# Patient Record
Sex: Female | Born: 1960 | Race: White | Hispanic: Yes | Smoking: Never smoker
Health system: Southern US, Community
[De-identification: ages and names within clinical notes are randomized; demographics above are authoritative.]

## PROBLEM LIST (undated history)

## (undated) DIAGNOSIS — D649 Anemia, unspecified: Secondary | ICD-10-CM

## (undated) DIAGNOSIS — I10 Essential (primary) hypertension: Secondary | ICD-10-CM

## (undated) DIAGNOSIS — G43909 Migraine, unspecified, not intractable, without status migrainosus: Secondary | ICD-10-CM

## (undated) DIAGNOSIS — K219 Gastro-esophageal reflux disease without esophagitis: Secondary | ICD-10-CM

## (undated) DIAGNOSIS — G4700 Insomnia, unspecified: Secondary | ICD-10-CM

## (undated) DIAGNOSIS — F329 Major depressive disorder, single episode, unspecified: Secondary | ICD-10-CM

## (undated) DIAGNOSIS — J45909 Unspecified asthma, uncomplicated: Secondary | ICD-10-CM

## (undated) DIAGNOSIS — F419 Anxiety disorder, unspecified: Secondary | ICD-10-CM

## (undated) DIAGNOSIS — T8859XA Other complications of anesthesia, initial encounter: Secondary | ICD-10-CM

## (undated) DIAGNOSIS — F32A Depression, unspecified: Secondary | ICD-10-CM

## (undated) HISTORY — PX: CHOLECYSTECTOMY: SHX55

## (undated) HISTORY — PX: KNEE ARTHROSCOPY: SUR90

## (undated) HISTORY — DX: Other complications of anesthesia, initial encounter: T88.59XA

---

## 1997-09-29 ENCOUNTER — Other Ambulatory Visit: Admission: RE | Admit: 1997-09-29 | Discharge: 1997-09-29 | Payer: Self-pay | Admitting: Obstetrics and Gynecology

## 1998-07-11 ENCOUNTER — Other Ambulatory Visit: Admission: RE | Admit: 1998-07-11 | Discharge: 1998-07-11 | Payer: Self-pay | Admitting: *Deleted

## 1998-12-27 ENCOUNTER — Inpatient Hospital Stay: Admission: EM | Admit: 1998-12-27 | Discharge: 1998-12-29 | Payer: Self-pay | Admitting: Emergency Medicine

## 1999-03-22 ENCOUNTER — Inpatient Hospital Stay (HOSPITAL_COMMUNITY): Admission: AD | Admit: 1999-03-22 | Discharge: 1999-03-22 | Payer: Self-pay | Admitting: *Deleted

## 1999-03-29 ENCOUNTER — Inpatient Hospital Stay (HOSPITAL_COMMUNITY): Admission: AD | Admit: 1999-03-29 | Discharge: 1999-04-01 | Payer: Self-pay | Admitting: *Deleted

## 1999-04-02 ENCOUNTER — Encounter: Admission: RE | Admit: 1999-04-02 | Discharge: 1999-06-12 | Payer: Self-pay | Admitting: *Deleted

## 1999-05-10 ENCOUNTER — Other Ambulatory Visit: Admission: RE | Admit: 1999-05-10 | Discharge: 1999-05-10 | Payer: Self-pay | Admitting: *Deleted

## 2000-06-10 ENCOUNTER — Other Ambulatory Visit: Admission: RE | Admit: 2000-06-10 | Discharge: 2000-06-10 | Payer: Self-pay | Admitting: *Deleted

## 2001-02-25 ENCOUNTER — Encounter: Payer: Self-pay | Admitting: *Deleted

## 2001-02-25 ENCOUNTER — Encounter: Admission: RE | Admit: 2001-02-25 | Discharge: 2001-02-25 | Payer: Self-pay | Admitting: *Deleted

## 2001-12-10 ENCOUNTER — Other Ambulatory Visit: Admission: RE | Admit: 2001-12-10 | Discharge: 2001-12-10 | Payer: Self-pay | Admitting: *Deleted

## 2002-05-04 ENCOUNTER — Encounter: Payer: Self-pay | Admitting: Orthopedic Surgery

## 2002-05-04 ENCOUNTER — Encounter: Admission: RE | Admit: 2002-05-04 | Discharge: 2002-05-04 | Payer: Self-pay | Admitting: Orthopedic Surgery

## 2002-10-05 ENCOUNTER — Encounter: Payer: Self-pay | Admitting: *Deleted

## 2002-10-05 ENCOUNTER — Encounter: Admission: RE | Admit: 2002-10-05 | Discharge: 2002-10-05 | Payer: Self-pay | Admitting: *Deleted

## 2003-05-26 ENCOUNTER — Other Ambulatory Visit: Admission: RE | Admit: 2003-05-26 | Discharge: 2003-05-26 | Payer: Self-pay | Admitting: *Deleted

## 2004-05-28 ENCOUNTER — Other Ambulatory Visit: Admission: RE | Admit: 2004-05-28 | Discharge: 2004-05-28 | Payer: Self-pay | Admitting: Obstetrics and Gynecology

## 2017-05-05 ENCOUNTER — Ambulatory Visit: Payer: Self-pay | Admitting: General Surgery

## 2017-05-05 ENCOUNTER — Inpatient Hospital Stay (HOSPITAL_COMMUNITY)
Admission: EM | Admit: 2017-05-05 | Discharge: 2017-05-11 | DRG: 392 | Disposition: A | Discharge status: Home / Self Care | Payer: BC Managed Care – PPO | Attending: General Surgery | Admitting: General Surgery

## 2017-05-05 ENCOUNTER — Encounter (HOSPITAL_COMMUNITY): Payer: Self-pay | Admitting: Emergency Medicine

## 2017-05-05 ENCOUNTER — Emergency Department (HOSPITAL_COMMUNITY): Payer: BC Managed Care – PPO

## 2017-05-05 DIAGNOSIS — I959 Hypotension, unspecified: Secondary | ICD-10-CM | POA: Diagnosis present

## 2017-05-05 DIAGNOSIS — I10 Essential (primary) hypertension: Secondary | ICD-10-CM | POA: Diagnosis present

## 2017-05-05 DIAGNOSIS — K56609 Unspecified intestinal obstruction, unspecified as to partial versus complete obstruction: Secondary | ICD-10-CM | POA: Diagnosis present

## 2017-05-05 DIAGNOSIS — D509 Iron deficiency anemia, unspecified: Secondary | ICD-10-CM | POA: Diagnosis present

## 2017-05-05 DIAGNOSIS — J45909 Unspecified asthma, uncomplicated: Secondary | ICD-10-CM | POA: Diagnosis present

## 2017-05-05 DIAGNOSIS — N179 Acute kidney failure, unspecified: Secondary | ICD-10-CM | POA: Diagnosis present

## 2017-05-05 DIAGNOSIS — Z791 Long term (current) use of non-steroidal anti-inflammatories (NSAID): Secondary | ICD-10-CM | POA: Diagnosis not present

## 2017-05-05 DIAGNOSIS — E860 Dehydration: Secondary | ICD-10-CM | POA: Diagnosis present

## 2017-05-05 DIAGNOSIS — E871 Hypo-osmolality and hyponatremia: Secondary | ICD-10-CM | POA: Diagnosis present

## 2017-05-05 DIAGNOSIS — R42 Dizziness and giddiness: Secondary | ICD-10-CM | POA: Diagnosis not present

## 2017-05-05 DIAGNOSIS — Z79899 Other long term (current) drug therapy: Secondary | ICD-10-CM | POA: Diagnosis not present

## 2017-05-05 DIAGNOSIS — F419 Anxiety disorder, unspecified: Secondary | ICD-10-CM | POA: Diagnosis present

## 2017-05-05 DIAGNOSIS — K5720 Diverticulitis of large intestine with perforation and abscess without bleeding: Principal | ICD-10-CM | POA: Diagnosis present

## 2017-05-05 DIAGNOSIS — R61 Generalized hyperhidrosis: Secondary | ICD-10-CM | POA: Diagnosis not present

## 2017-05-05 DIAGNOSIS — K219 Gastro-esophageal reflux disease without esophagitis: Secondary | ICD-10-CM | POA: Diagnosis present

## 2017-05-05 DIAGNOSIS — E876 Hypokalemia: Secondary | ICD-10-CM | POA: Diagnosis present

## 2017-05-05 DIAGNOSIS — R17 Unspecified jaundice: Secondary | ICD-10-CM | POA: Diagnosis present

## 2017-05-05 HISTORY — DX: Depression, unspecified: F32.A

## 2017-05-05 HISTORY — DX: Essential (primary) hypertension: I10

## 2017-05-05 HISTORY — DX: Migraine, unspecified, not intractable, without status migrainosus: G43.909

## 2017-05-05 HISTORY — DX: Anemia, unspecified: D64.9

## 2017-05-05 HISTORY — DX: Major depressive disorder, single episode, unspecified: F32.9

## 2017-05-05 HISTORY — DX: Insomnia, unspecified: G47.00

## 2017-05-05 HISTORY — DX: Gastro-esophageal reflux disease without esophagitis: K21.9

## 2017-05-05 HISTORY — DX: Anxiety disorder, unspecified: F41.9

## 2017-05-05 HISTORY — DX: Unspecified asthma, uncomplicated: J45.909

## 2017-05-05 LAB — COMPREHENSIVE METABOLIC PANEL
ALK PHOS: 186 U/L — AB (ref 38–126)
ALT: 25 U/L (ref 14–54)
ANION GAP: 15 (ref 5–15)
AST: 27 U/L (ref 15–41)
Albumin: 3.1 g/dL — ABNORMAL LOW (ref 3.5–5.0)
BUN: 29 mg/dL — ABNORMAL HIGH (ref 6–20)
CALCIUM: 9 mg/dL (ref 8.9–10.3)
CHLORIDE: 91 mmol/L — AB (ref 101–111)
CO2: 25 mmol/L (ref 22–32)
CREATININE: 1.84 mg/dL — AB (ref 0.44–1.00)
GFR calc Af Amer: 34 mL/min — ABNORMAL LOW (ref >60)
GFR calc non Af Amer: 29 mL/min — ABNORMAL LOW (ref >60)
GLUCOSE: 137 mg/dL — AB (ref 65–99)
Potassium: 3.3 mmol/L — ABNORMAL LOW (ref 3.5–5.1)
SODIUM: 131 mmol/L — AB (ref 135–145)
Total Bilirubin: 3.7 mg/dL — ABNORMAL HIGH (ref 0.3–1.2)
Total Protein: 7.8 g/dL (ref 6.5–8.1)

## 2017-05-05 LAB — CBC WITH DIFFERENTIAL/PLATELET
BASOS PCT: 0 %
Basophils Absolute: 0 10*3/uL (ref 0.0–0.1)
Eosinophils Absolute: 0 10*3/uL (ref 0.0–0.7)
Eosinophils Relative: 0 %
HEMATOCRIT: 35.2 % — AB (ref 36.0–46.0)
HEMOGLOBIN: 11.9 g/dL — AB (ref 12.0–15.0)
LYMPHS ABS: 1.2 10*3/uL (ref 0.7–4.0)
Lymphocytes Relative: 6 %
MCH: 31.2 pg (ref 26.0–34.0)
MCHC: 33.8 g/dL (ref 30.0–36.0)
MCV: 92.1 fL (ref 78.0–100.0)
MONO ABS: 1.3 10*3/uL — AB (ref 0.1–1.0)
Monocytes Relative: 7 %
NEUTROS ABS: 17.7 10*3/uL — AB (ref 1.7–7.7)
Neutrophils Relative %: 87 %
Platelets: 431 10*3/uL — ABNORMAL HIGH (ref 150–400)
RBC: 3.82 MIL/uL — ABNORMAL LOW (ref 3.87–5.11)
RDW: 13 % (ref 11.5–15.5)
WBC: 20.2 10*3/uL — ABNORMAL HIGH (ref 4.0–10.5)

## 2017-05-05 LAB — URINALYSIS, ROUTINE W REFLEX MICROSCOPIC
Bilirubin Urine: NEGATIVE
GLUCOSE, UA: NEGATIVE mg/dL
Ketones, ur: NEGATIVE mg/dL
NITRITE: NEGATIVE
PH: 5 (ref 5.0–8.0)
Protein, ur: 30 mg/dL — AB
SPECIFIC GRAVITY, URINE: 1.017 (ref 1.005–1.030)

## 2017-05-05 LAB — I-STAT CG4 LACTIC ACID, ED: LACTIC ACID, VENOUS: 1.68 mmol/L (ref 0.5–1.9)

## 2017-05-05 IMAGING — CT CT ABD-PELV W/O CM
2 of 4 series · 15 of 46 positions shown, 17 images · non-contrast
Comparison: Radiograph 05/05/2017

CLINICAL DATA: Diffuse abdominal pain for 4 days with nausea and
diarrhea

EXAM:
CT ABDOMEN AND PELVIS WITHOUT CONTRAST
TECHNIQUE: Multidetector CT imaging of the abdomen and pelvis was performed
following the standard protocol without IV contrast.

[Series 2: axial st · axial · 0.77mm/px · z∈[-487,-87]mm · 12 of 92 slices shown, 14 images]
[im 6/92  soft-tissue]
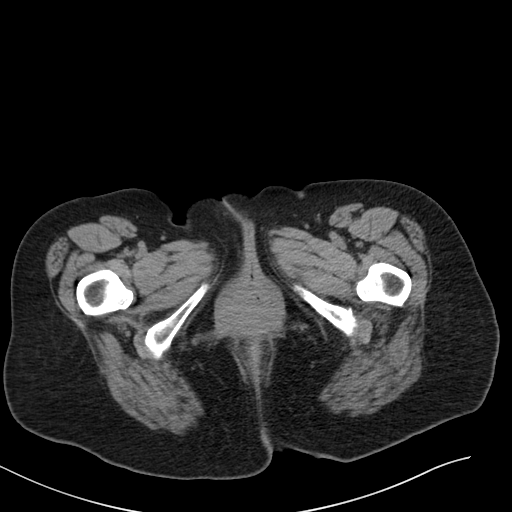
[im 6/92  bone]
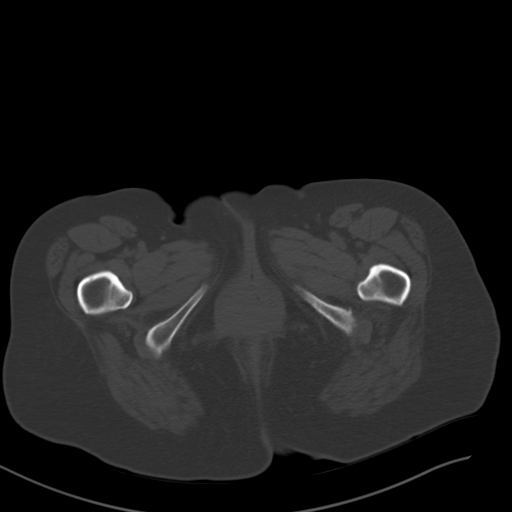
[im 16/92  soft-tissue]
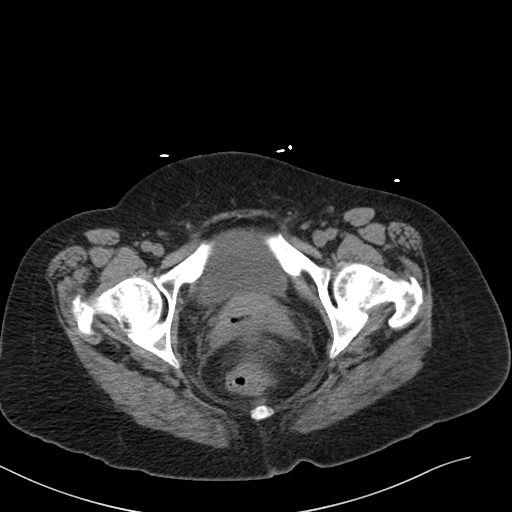
[im 21/92  soft-tissue]
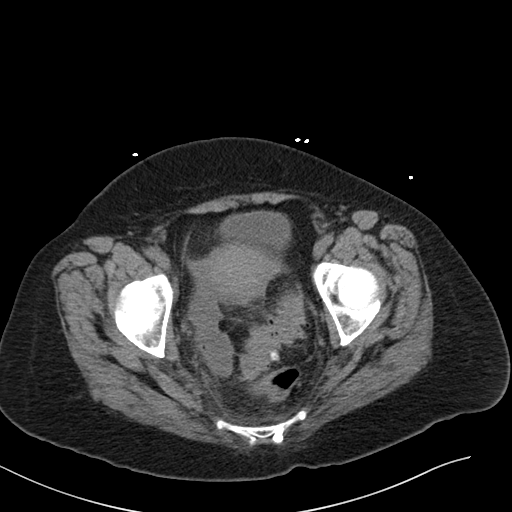
[im 26/92  soft-tissue]
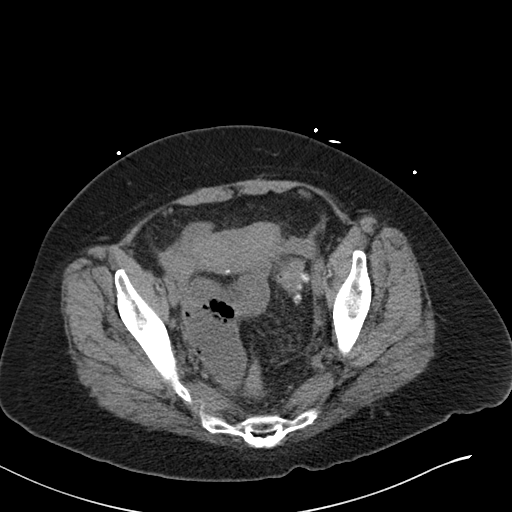
[im 36/92  soft-tissue]
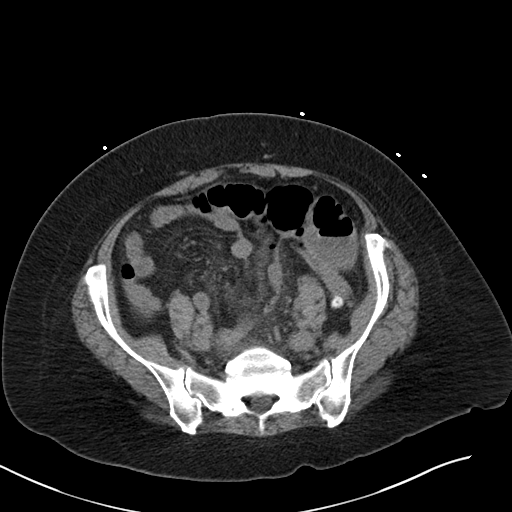
[im 41/92  soft-tissue]
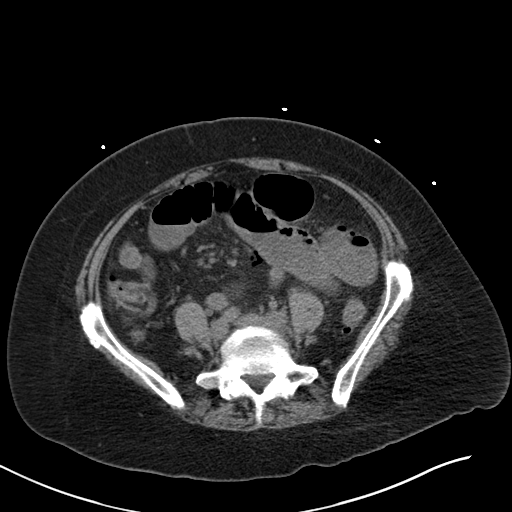
[im 51/92  soft-tissue]
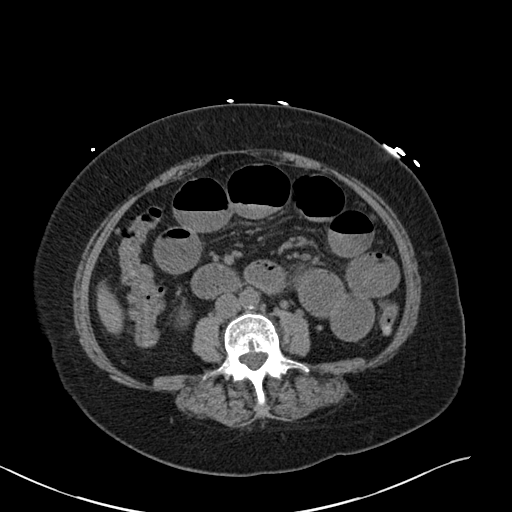
[im 56/92  soft-tissue]
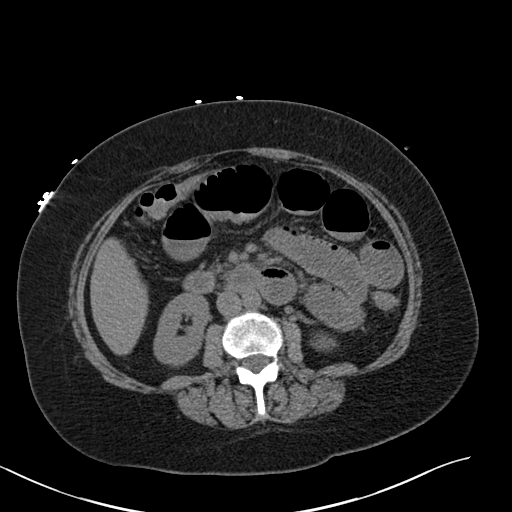
[im 66/92  soft-tissue]
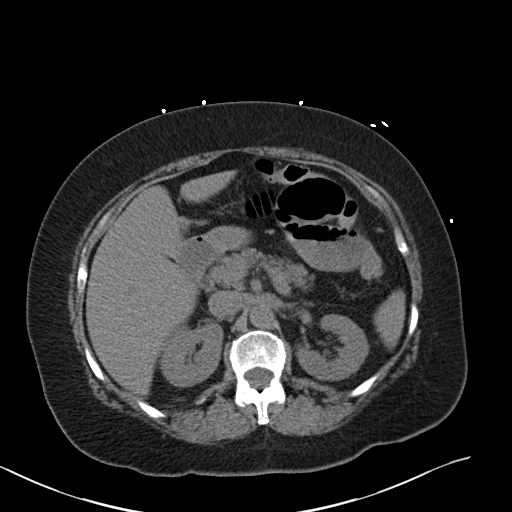
[im 66/92  bone]
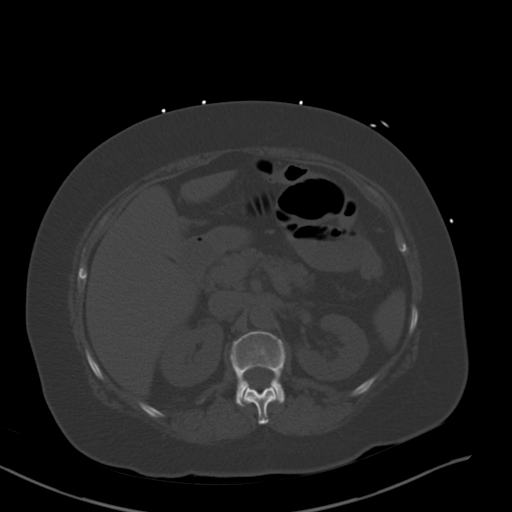
[im 71/92  soft-tissue]
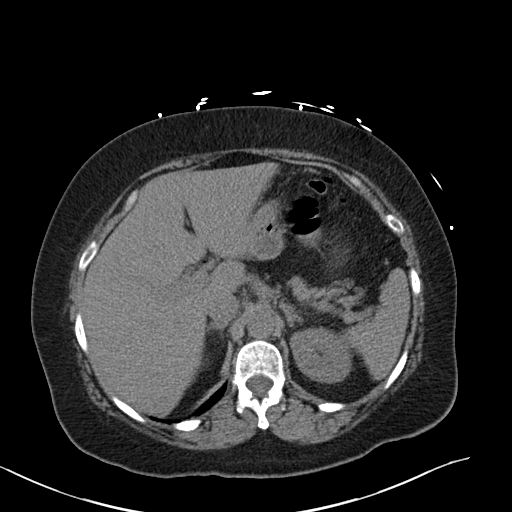
[im 76/92  soft-tissue]
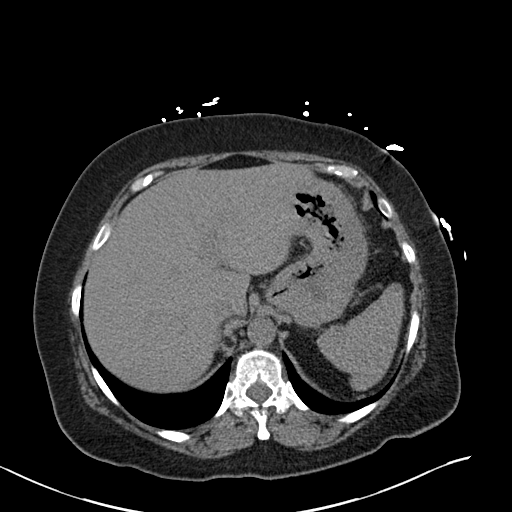
[im 86/92  soft-tissue]
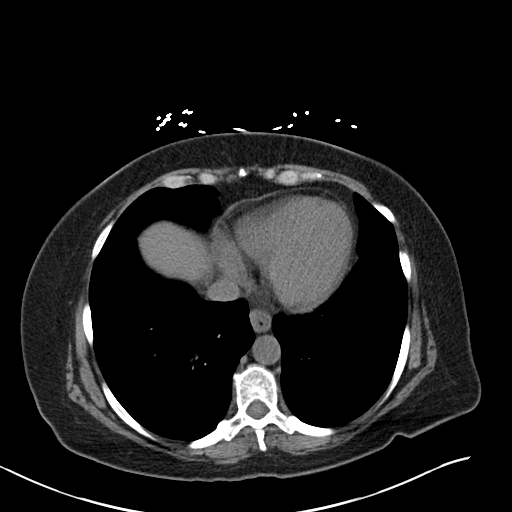

[Series 5: coronal st · coronal · 0.70mm/px · 3 of 105 slices shown]
[im 35/105  soft-tissue]
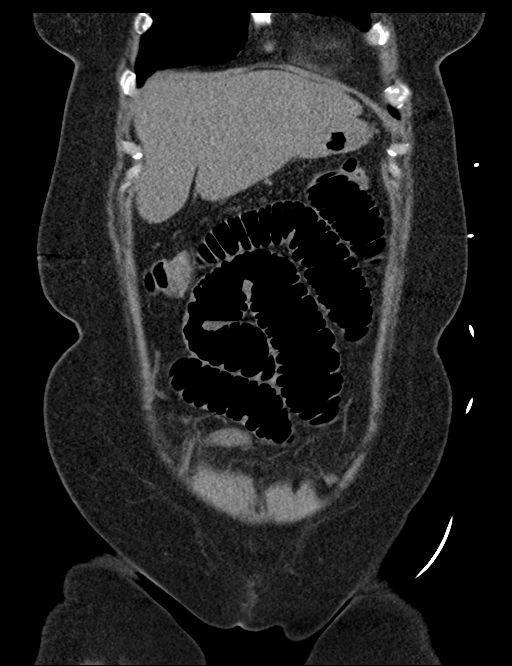
[im 47/105  soft-tissue]
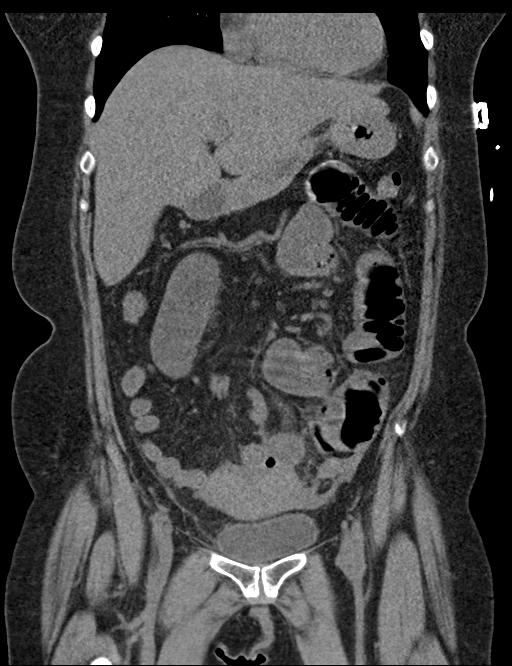
[im 58/105  soft-tissue]
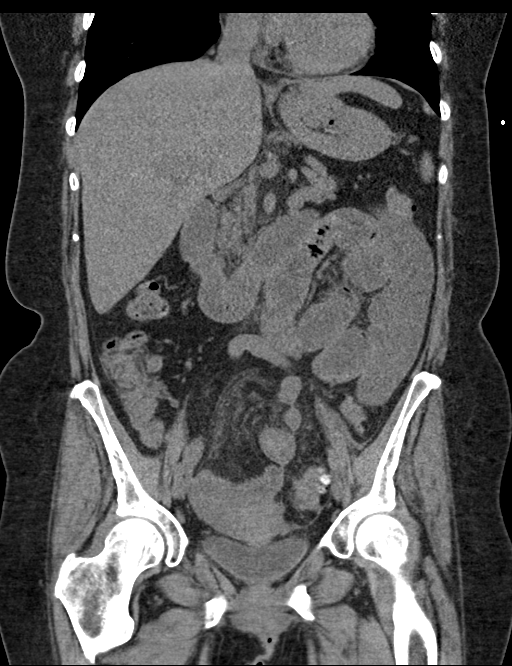

[15 of 46 positions shown; findings below may reference images not displayed]

FINDINGS: Lower chest: Lung bases demonstrate no consolidation or pleural
effusion. The heart size is within normal limits. Small hiatal
hernia

Hepatobiliary: No focal liver abnormality is seen. Status post
cholecystectomy. No biliary dilatation.

Pancreas: Unremarkable. No pancreatic ductal dilatation or
surrounding inflammatory changes.

Spleen: Normal in size without focal abnormality.

Adrenals/Urinary Tract: Adrenal glands are unremarkable. Kidneys are
normal, without renal calculi, focal lesion, or hydronephrosis.
Bladder is unremarkable.

Stomach/Bowel: Multiple loops of dilated fluid-filled small bowel
measuring up to 4.1 cm consistent with small bowel obstruction.
Transition point visualized within the central pelvis slightly to
the right of midline. Normal appendix. Sigmoid colon diverticular
disease with some wall thickening present. Perforated viscus as
evidence by multiple extraluminal gas collections within the lower
abdominal and pelvic mesentery. Possible source at the distal
sigmoid colon, series 2, image number 7 and coronal series 5, image
number 72. Mildly thickened bowel loop with gas adjacent to it,
series 2, image number 64.

Vascular/Lymphatic: Mild aortic atherosclerosis. No aneurysmal
dilatation. Mild retroperitoneal lymph nodes.

Reproductive: Uterus and bilateral adnexa are unremarkable.

Other: Small amount of free fluid in the pelvis. Right pelvic gas
and fluid collection measuring 7.8 x 4.1 cm, appears contiguous on
coronal views with gas and fluid adjacent to the sigmoid colon.

Musculoskeletal: No acute or significant osseous findings.
IMPRESSION: 1. Multiple gas locules within the lower abdominal and pelvic
mesentery consistent with hollow viscus perforation. Suspected
source is distal sigmoid colon diverticulitis where there is gas and
fluid that appears contiguous with a gas and fluid collection in the
right pelvis measuring 7.8 x 4.1 cm, suspect for abscess.
2. Multiple loops of fluid-filled dilated small bowel consistent
with mechanical bowel obstruction; transition point is in the
central pelvis slightly to the right of midline, adjacent to the gas
and fluid collection in the right pelvis.

Critical Value/emergent results were called by telephone at the time
of interpretation on 05/05/2017 at [DATE] to Dr. BLADE AUJLA , who
verbally acknowledged these results.

## 2017-05-05 MED ORDER — DIPHENHYDRAMINE HCL 12.5 MG/5ML PO ELIX
12.5000 mg | ORAL_SOLUTION | Freq: Four times a day (QID) | ORAL | Status: DC | PRN
  Administered 2017-05-10: 12.5 mg via ORAL
  Filled 2017-05-05: qty 5

## 2017-05-05 MED ORDER — HEPARIN SODIUM (PORCINE) 5000 UNIT/ML IJ SOLN
5000.0000 [IU] | Freq: Once | INTRAMUSCULAR | Status: DC
  Filled 2017-05-05: qty 1

## 2017-05-05 MED ORDER — LORAZEPAM 2 MG/ML IJ SOLN: 0.5000 mg | Freq: Two times a day (BID) | INTRAMUSCULAR | Status: DC | PRN

## 2017-05-05 MED ORDER — DIPHENHYDRAMINE HCL 50 MG/ML IJ SOLN: 12.5000 mg | Freq: Four times a day (QID) | INTRAMUSCULAR | Status: DC | PRN

## 2017-05-05 MED ORDER — POTASSIUM CHLORIDE IN NACL 20-0.9 MEQ/L-% IV SOLN
INTRAVENOUS | Status: DC
  Administered 2017-05-05 – 2017-05-10 (×9): via INTRAVENOUS
  Filled 2017-05-05 (×12): qty 1000

## 2017-05-05 MED ORDER — ACETAMINOPHEN 325 MG PO TABS
650.0000 mg | ORAL_TABLET | Freq: Four times a day (QID) | ORAL | Status: DC | PRN
  Administered 2017-05-06 – 2017-05-10 (×7): 650 mg via ORAL
  Filled 2017-05-05 (×7): qty 2

## 2017-05-05 MED ORDER — SODIUM CHLORIDE 0.9 % IV BOLUS (SEPSIS)
1000.0000 mL | Freq: Once | INTRAVENOUS | Status: AC
  Administered 2017-05-05: 1000 mL via INTRAVENOUS

## 2017-05-05 MED ORDER — FENTANYL CITRATE (PF) 100 MCG/2ML IJ SOLN
50.0000 ug | Freq: Once | INTRAMUSCULAR | Status: AC
  Administered 2017-05-05: 50 ug via INTRAVENOUS
  Filled 2017-05-05: qty 2

## 2017-05-05 MED ORDER — ACETAMINOPHEN 650 MG RE SUPP: 650.0000 mg | Freq: Four times a day (QID) | RECTAL | Status: DC | PRN

## 2017-05-05 MED ORDER — PANTOPRAZOLE SODIUM 40 MG IV SOLR
40.0000 mg | Freq: Every day | INTRAVENOUS | Status: DC
  Administered 2017-05-05 – 2017-05-10 (×6): 40 mg via INTRAVENOUS
  Filled 2017-05-05 (×6): qty 40

## 2017-05-05 MED ORDER — PROMETHAZINE HCL 25 MG/ML IJ SOLN
12.5000 mg | Freq: Four times a day (QID) | INTRAMUSCULAR | Status: DC | PRN
  Administered 2017-05-07: 12.5 mg via INTRAVENOUS
  Filled 2017-05-05 (×2): qty 1

## 2017-05-05 MED ORDER — POTASSIUM CHLORIDE 10 MEQ/100ML IV SOLN
10.0000 meq | INTRAVENOUS | Status: AC
  Administered 2017-05-05 (×3): 10 meq via INTRAVENOUS
  Filled 2017-05-05 (×6): qty 100

## 2017-05-05 MED ORDER — PIPERACILLIN-TAZOBACTAM 3.375 G IVPB
3.3750 g | Freq: Three times a day (TID) | INTRAVENOUS | Status: DC
  Administered 2017-05-05 – 2017-05-11 (×16): 3.375 g via INTRAVENOUS
  Filled 2017-05-05 (×20): qty 50

## 2017-05-05 MED ORDER — LACTATED RINGERS IV BOLUS
500.0000 mL | Freq: Once | INTRAVENOUS | Status: AC
  Administered 2017-05-05: 500 mL via INTRAVENOUS

## 2017-05-05 MED ORDER — ONDANSETRON 4 MG PO TBDP
4.0000 mg | ORAL_TABLET | Freq: Four times a day (QID) | ORAL | Status: DC | PRN
Start: 2017-05-05 — End: 2017-05-11

## 2017-05-05 MED ORDER — FENTANYL CITRATE (PF) 100 MCG/2ML IJ SOLN
50.0000 ug | Freq: Once | INTRAMUSCULAR | Status: AC
Start: 2017-05-05 — End: 2017-05-05
  Administered 2017-05-05: 50 ug via INTRAVENOUS
  Filled 2017-05-05: qty 2

## 2017-05-05 MED ORDER — ONDANSETRON HCL 4 MG/2ML IJ SOLN
4.0000 mg | Freq: Once | INTRAMUSCULAR | Status: AC
  Administered 2017-05-05: 4 mg via INTRAVENOUS
  Filled 2017-05-05: qty 2

## 2017-05-05 MED ORDER — ONDANSETRON HCL 4 MG/2ML IJ SOLN
4.0000 mg | Freq: Four times a day (QID) | INTRAMUSCULAR | Status: DC | PRN
Start: 2017-05-05 — End: 2017-05-11
  Administered 2017-05-06 – 2017-05-10 (×5): 4 mg via INTRAVENOUS
  Filled 2017-05-05 (×5): qty 2

## 2017-05-05 MED ORDER — PIPERACILLIN-TAZOBACTAM 3.375 G IVPB 30 MIN
3.3750 g | Freq: Once | INTRAVENOUS | Status: AC
  Administered 2017-05-05: 3.375 g via INTRAVENOUS
  Filled 2017-05-05: qty 50

## 2017-05-05 MED ORDER — MORPHINE SULFATE (PF) 2 MG/ML IV SOLN
1.0000 mg | INTRAVENOUS | Status: DC | PRN
  Administered 2017-05-05 – 2017-05-09 (×6): 2 mg via INTRAVENOUS
  Administered 2017-05-09: 3 mg via INTRAVENOUS
  Administered 2017-05-10: 2 mg via INTRAVENOUS
  Filled 2017-05-05 (×4): qty 1
  Filled 2017-05-05: qty 2
  Filled 2017-05-05 (×4): qty 1

## 2017-05-05 NOTE — H&P (Addendum)
[facility] Admission Note  Kim Snow [birth year]  [id number].    Requesting MD: Ellender Hose, MD Chief Complaint/Reason for Consult: abdominal pain, perforated viscus   HPI:  57 year old female with a medical history of hypertension, reflux, iron deficiency, and anxiety who presented to [facility] emergency department with almost 4 days of abdominal pain.  Patient states that her pain started on Friday and became very severe on Saturday.  Patient states that on Saturday she was unable to stand up straight.  Describes the pain as lower abdominal pain that is non-radiating 10 out of 10 in severity.  She did not try to take any medication to relieve the pain.  Associated symptoms include nonbloody diarrhea, nausea, and fever.  The patient denies similar pain in the past.  She reports that she has had a colonoscopy with no abnormal findings.  She denies a previous diagnosis of diverticulosis and no personal history of cancer.  Her father had diverticulitis. She reports that her maternal grandparents had "stomach cancer" but denies a known family history of colon cancer.  The patient is a non-smoker, denies illicit drug use, reports drinking alcohol 3 times weekly.  She denies the use of blood thinning medications.  Her last meal was Jell-O yesterday evening.  Past abdominal surgeries include laparoscopic cholecystectomy and cesarean section.  Her mother and her son are at bedside.  Workup in the emergency department was significant for hypotension (81/56), tachycardia (123 bpm), WBC 20,000, and CT scan concerning for perforated viscus with loculated fluid in the pelvis and a 7.8 x 4.1 cm fluid collection.  General surgery has been asked to evaluate.  ROS: Review of Systems  Constitutional: Positive for chills and fever.  Respiratory: Negative for shortness of breath.   Cardiovascular: Negative for chest pain.  Gastrointestinal: Positive for abdominal pain, diarrhea and nausea.  Negative for blood in stool, constipation and vomiting.  Genitourinary: Negative for dysuria, flank pain and hematuria.  All other systems reviewed and are negative.   History reviewed. No pertinent family history.  Past Medical History:  Diagnosis Date  . Anemia   . Anxiety   . Asthma   . Depression   . GERD (gastroesophageal reflux disease)   . Hypertension   . Insomnia   . Migraine     Past Surgical History:  Procedure Laterality Date  . CESAREAN SECTION    . CHOLECYSTECTOMY    . KNEE ARTHROSCOPY      Social History:  reports that she has never smoked. She has never used smokeless tobacco. She reports that she drinks alcohol. She reports that she does not use drugs.  Allergies: No Known Allergies   (Not in a hospital admission)  Blood pressure (!) 95/55, pulse (!) 114, temperature 98.6 F (37 C), resp. rate 13, height '5\' 5"'  (1.651 m), weight 72.6 kg (160 lb), SpO2 99 %. Physical Exam: Physical Exam  Constitutional: She is oriented to person, place, and time. She appears well-developed and well-nourished.  Non-toxic appearance. She does not appear ill.  HENT:  Head: Normocephalic and atraumatic.  Mouth/Throat: Oropharynx is clear and moist.  Eyes: EOM are normal. No scleral icterus.  Cardiovascular: Regular rhythm. Exam reveals no gallop and no friction rub.  No murmur heard. Tachycardia  Pulmonary/Chest: Effort normal and breath sounds normal. No respiratory distress. She has no wheezes. She has no rhonchi. She has no rales.  Abdominal: She exhibits distension. Bowel sounds are decreased. There is tenderness in the right lower quadrant, periumbilical area  and suprapubic area. There is no rigidity, no rebound and no guarding.  Neurological: She is alert and oriented to person, place, and time.  Skin: Skin is warm and dry. No cyanosis.  Psychiatric: She has a normal mood and affect. Her behavior is normal.    Results for orders placed or performed during the  hospital encounter of 05/05/17 (from the past 48 hour(s))  Comprehensive metabolic panel     Status: Abnormal   Collection Time: 05/05/17  1:28 PM  Result Value Ref Range   Sodium 131 (L) 135 - 145 mmol/L   Potassium 3.3 (L) 3.5 - 5.1 mmol/L   Chloride 91 (L) 101 - 111 mmol/L   CO2 25 22 - 32 mmol/L   Glucose, Bld 137 (H) 65 - 99 mg/dL   BUN 29 (H) 6 - 20 mg/dL   Creatinine, Ser 1.84 (H) 0.44 - 1.00 mg/dL   Calcium 9.0 8.9 - 10.3 mg/dL   Total Protein 7.8 6.5 - 8.1 g/dL   Albumin 3.1 (L) 3.5 - 5.0 g/dL   AST 27 15 - 41 U/L   ALT 25 14 - 54 U/L   Alkaline Phosphatase 186 (H) 38 - 126 U/L   Total Bilirubin 3.7 (H) 0.3 - 1.2 mg/dL   GFR calc non Af Amer 29 (L) >60 mL/min   GFR calc Af Amer 34 (L) >60 mL/min    Comment: (NOTE) The eGFR has been calculated using the CKD EPI equation. This calculation has not been validated in all clinical situations. eGFR's persistently <60 mL/min signify possible Chronic Kidney Disease.    Anion gap 15 5 - 15    Comment: Performed at [facility], [place] [address] [place], [place] [address]  CBC WITH DIFFERENTIAL     Status: Abnormal   Collection Time: 05/05/17  1:28 PM  Result Value Ref Range   WBC 20.2 (H) 4.0 - 10.5 K/uL   RBC 3.82 (L) 3.87 - 5.11 MIL/uL   Hemoglobin 11.9 (L) 12.0 - 15.0 g/dL   HCT 35.2 (L) 36.0 - 46.0 %   MCV 92.1 78.0 - 100.0 fL   MCH 31.2 26.0 - 34.0 pg   MCHC 33.8 30.0 - 36.0 g/dL   RDW 13.0 11.5 - 15.5 %   Platelets 431 (H) 150 - 400 K/uL   Neutrophils Relative % 87 %   Neutro Abs 17.7 (H) 1.7 - 7.7 K/uL   Lymphocytes Relative 6 %   Lymphs Abs 1.2 0.7 - 4.0 K/uL   Monocytes Relative 7 %   Monocytes Absolute 1.3 (H) 0.1 - 1.0 K/uL   Eosinophils Relative 0 %   Eosinophils Absolute 0.0 0.0 - 0.7 K/uL   Basophils Relative 0 %   Basophils Absolute 0.0 0.0 - 0.1 K/uL    Comment: Performed at [facility], [place] [address] [place], [place] [address]  I-Stat CG4 Lactic Acid, ED  (not  at  [facility])     Status: None   Collection Time: 05/05/17  2:12 PM  Result Value Ref Range   Lactic Acid, Venous 1.68 0.5 - 1.9 mmol/L   Ct Abdomen Pelvis Wo Contrast  Result Date: 05/05/2017 CLINICAL DATA:  Diffuse abdominal pain for 4 days with nausea and diarrhea EXAM: CT ABDOMEN AND PELVIS WITHOUT CONTRAST TECHNIQUE: Multidetector CT imaging of the abdomen and pelvis was performed following the standard protocol without IV contrast. COMPARISON:  Radiograph 05/05/2017 FINDINGS: Lower chest: Lung bases demonstrate no consolidation or pleural effusion. The heart size  is within normal limits. Small hiatal hernia Hepatobiliary: No focal liver abnormality is seen. Status post cholecystectomy. No biliary dilatation. Pancreas: Unremarkable. No pancreatic ductal dilatation or surrounding inflammatory changes. Spleen: Normal in size without focal abnormality. Adrenals/Urinary Tract: Adrenal glands are unremarkable. Kidneys are normal, without renal calculi, focal lesion, or hydronephrosis. Bladder is unremarkable. Stomach/Bowel: Multiple loops of dilated fluid-filled small bowel measuring up to 4.1 cm consistent with small bowel obstruction. Transition point visualized within the central pelvis slightly to the right of midline. Normal appendix. Sigmoid colon diverticular disease with some wall thickening present. Perforated viscus as evidence by multiple extraluminal gas collections within the lower abdominal and pelvic mesentery. Possible source at the distal sigmoid colon, series 2, image number 7 and coronal series 5, image number 72. Mildly thickened bowel loop with gas adjacent to it, series 2, image number 64. Vascular/Lymphatic: Mild aortic atherosclerosis. No aneurysmal dilatation. Mild retroperitoneal lymph nodes. Reproductive: Uterus and bilateral adnexa are unremarkable. Other: Small amount of free fluid in the pelvis. Right pelvic gas and fluid collection measuring 7.8 x 4.1 cm, appears contiguous on  coronal views with gas and fluid adjacent to the sigmoid colon. Musculoskeletal: No acute or significant osseous findings. IMPRESSION: 1. Multiple gas locules within the lower abdominal and pelvic mesentery consistent with hollow viscus perforation. Suspected source is distal sigmoid colon diverticulitis where there is gas and fluid that appears contiguous with a gas and fluid collection in the right pelvis measuring 7.8 x 4.1 cm, suspect for abscess. 2. Multiple loops of fluid-filled dilated small bowel consistent with mechanical bowel obstruction; transition point is in the central pelvis slightly to the right of midline, adjacent to the gas and fluid collection in the right pelvis. Critical Value/emergent results were called by telephone at the time of interpretation on 05/05/2017 at 4:14 pm to Dr. Dorie Rank , who verbally acknowledged these results. Electronically Signed   By: Donavan Foil M.D.   On: 05/05/2017 16:14      Assessment/Plan HTN GERD - PPI Iron deficiency Hypotension Tachycardia  Leukocytosis  AKI   Perforated viscus of unknown etiology, suspect colon - Patient vital signs are somewhat improved after 2.5 L of IV fluids, continue aggressive resuscitation with IV fluids - NPO - IV Zosyn  Plan: Will discuss plan of care with MD. Patient does not appear toxic and blood pressure slightly improved with IV fluids.  Continue IV antibiotics and resuscitation.  If patient continues to improve may respond to IV antibiotics and percutaneous drainage of abscess, however will likely require exploratory laparotomy.     Jill Alexanders, [facility] 05/05/2017, 4:44 PM Pager: [phone] Consults: [phone] Mon-Fri 7:00 am-4:30 pm Sat-Sun 7:00 am-11:30 am

## 2017-05-05 NOTE — Progress Notes (Signed)
A consult was received from an ED physician for Zosyn per pharmacy dosing.  The patient's profile has been reviewed for ht/wt/allergies/indication/available labs.   A one time order has been placed for Zosyn 3.375g.  Further antibiotics/pharmacy consults should be ordered by admitting physician if indicated.                       Thank you, Berkley HarveyLegge, Amahd Morino Marshall 05/05/2017  1:52 PM

## 2017-05-05 NOTE — ED Notes (Signed)
Bed: WA03 Expected date:  Expected time:  Means of arrival:  Comments: [place]

## 2017-05-05 NOTE — Progress Notes (Signed)
Pharmacy Antibiotic Note  Iven FinnChristine G Rosenfield is a 57 y.o. female admitted on 05/05/2017 with intra-abdominal infection (diverticulitis of large intestines with abscess).  Pharmacy has been consulted for Zosyn dosing.   05/05/2017:  Afebrile  Elevated WBC, LA WNL  Scr elevated, Estimated CrCl ~5935ml/min (baseline Scr unknown)  Plan: Zosyn 3.375g IV q8h (4 hour infusion).  Height: 5\' 5"  (165.1 cm) Weight: 160 lb (72.6 kg) IBW/kg (Calculated) : 57  Temp (24hrs), Avg:99.2 F (37.3 C), Min:98.6 F (37 C), Max:99.7 F (37.6 C)  Recent Labs  Lab 05/05/17 1328 05/05/17 1412  WBC 20.2*  --   CREATININE 1.84*  --   LATICACIDVEN  --  1.68    Estimated Creatinine Clearance: 33.7 mL/min (A) (by C-G formula based on SCr of 1.84 mg/dL (H)).    No Known Allergies  Antimicrobials this admission: 3/25 Zosyn >>   Dose adjustments this admission:  Microbiology results: 3/25 BCx:  3/25 UCx:    Thank you for allowing pharmacy to be a part of this patient's care.  Elson ClanLilliston, Criss Bartles Michelle 05/05/2017 6:11 PM

## 2017-05-05 NOTE — Progress Notes (Signed)
Pt gave permission for her mother to remain in the room while the questions were asked on the nursing admission hx. Briscoe Burns. Carleane Rajeev Escue BSN, RN-BC Admissions RN 05/05/2017 2:52 PM

## 2017-05-05 NOTE — ED Provider Notes (Signed)
[facility]-EMERGENCY DEPT Provider Note   CSN: [id number] Arrival date & time: 05/05/17  1303     History   Chief Complaint Chief Complaint  Patient presents with  . Abdominal Pain    HPI Kim Snow is a 57 y.o. female.  HPI Patient presents to the emergency room for evaluation of abdominal pain.  Patient started having abdominal pain on Friday.  She was having nausea without any significant vomiting but did have diarrhea.  She initially thought she just caught a stomach bug tried to treat it at home.  Her symptoms progressed however throughout the weekend.  She started having more abdominal pain.  She went to see her primary care doctor today and was noted to have diffuse abdominal tenderness.  Patient had an elevated white blood cell count at the office. Past Medical History:  Diagnosis Date  . Anemia   . Anxiety   . Asthma   . Depression   . GERD (gastroesophageal reflux disease)   . Hypertension   . Insomnia   . Migraine     There are no active problems to display for this patient.   Past Surgical History:  Procedure Laterality Date  . CESAREAN SECTION    . CHOLECYSTECTOMY    . KNEE ARTHROSCOPY       OB History   None      Home Medications    Prior to Admission medications   Medication Sig Start Date End Date Taking? Authorizing Provider  ALPRAZolam Prudy Feeler) 1 MG tablet Take 1 mg by mouth. .5 MG IN THE AM, .5 MG IN THE AFTERNOON AND .5 MG AT BEDTIME 02/11/17  Yes [provider]  CALCIUM PO Take 1,000 mg by mouth daily.   Yes [provider]  Cyanocobalamin (VITAMIN B 12 PO) Take 1 tablet by mouth daily.   Yes [provider]  losartan-hydrochlorothiazide (HYZAAR) 100-25 MG tablet Take 1 tablet by mouth daily. 03/11/17  Yes [provider]  meloxicam (MOBIC) 15 MG tablet Take 15 mg by mouth daily at 12 noon. 04/21/17  Yes [provider]  omeprazole (PRILOSEC) 40 MG capsule Take 40 mg  by mouth daily. 03/28/17  Yes [provider]  Polysaccharide Iron Complex (IFEREX 150 PO) Take 150 mg by mouth daily.   Yes [provider]  PREMARIN vaginal cream Place 1 Applicatorful vaginally Nightly. 04/30/17  Yes [provider]    Family History History reviewed. No pertinent family history.  Social History Social History   Tobacco Use  . Smoking status: Never Smoker  . Smokeless tobacco: Never Used  Substance Use Topics  . Alcohol use: Yes    Comment: social  . Drug use: Never     Allergies   Patient has no known allergies.   Review of Systems Review of Systems  All other systems reviewed and are negative.    Physical Exam Updated Vital Signs BP (!) 98/50   Pulse (!) 106   Temp 98.6 F (37 C)   Resp 12   Ht 1.651 m (5\' 5" )   Wt 72.6 kg (160 lb)   SpO2 99%   BMI 26.63 kg/m   Physical Exam  Constitutional: She appears ill. No distress.  HENT:  Head: Normocephalic and atraumatic.  Right Ear: External ear normal.  Left Ear: External ear normal.  Eyes: Conjunctivae are normal. Right eye exhibits no discharge. Left eye exhibits no discharge. No scleral icterus.  Neck: Neck supple. No tracheal deviation present.  Cardiovascular: Normal rate, regular rhythm and intact distal pulses.  Pulmonary/Chest: Effort normal and breath sounds normal. No stridor. No respiratory distress. She has no wheezes. She has no rales.  Abdominal: Soft. Bowel sounds are normal. She exhibits no distension. There is generalized tenderness. There is guarding. There is no rigidity and no rebound. No hernia.  Musculoskeletal: She exhibits no edema or tenderness.  Neurological: She is alert. She has normal strength. No cranial nerve deficit (no facial droop, extraocular movements intact, no slurred speech) or sensory deficit. She exhibits normal muscle tone. She displays no seizure activity. Coordination normal.  Skin: Skin is warm and dry. No rash noted.    Psychiatric: She has a normal mood and affect.  Nursing note and vitals reviewed.    ED Treatments / Results  Labs (all labs ordered are listed, but only abnormal results are displayed) Labs Reviewed  COMPREHENSIVE METABOLIC PANEL - Abnormal; Notable for the following components:      Result Value   Sodium 131 (*)    Potassium 3.3 (*)    Chloride 91 (*)    Glucose, Bld 137 (*)    BUN 29 (*)    Creatinine, Ser 1.84 (*)    Albumin 3.1 (*)    Alkaline Phosphatase 186 (*)    Total Bilirubin 3.7 (*)    GFR calc non Af Amer 29 (*)    GFR calc Af Amer 34 (*)    All other components within normal limits  CBC WITH DIFFERENTIAL/PLATELET - Abnormal; Notable for the following components:   WBC 20.2 (*)    RBC 3.82 (*)    Hemoglobin 11.9 (*)    HCT 35.2 (*)    Platelets 431 (*)    Neutro Abs 17.7 (*)    Monocytes Absolute 1.3 (*)    All other components within normal limits  CULTURE, BLOOD (ROUTINE X 2)  CULTURE, BLOOD (ROUTINE X 2)  URINE CULTURE  URINALYSIS, ROUTINE W REFLEX MICROSCOPIC  I-STAT CG4 LACTIC ACID, ED    EKG EKG Interpretation  Date/Time:  Monday May 05 2017 13:55:47 EDT Ventricular Rate:  111 PR Interval:    QRS Duration: 144 QT Interval:  357 QTC Calculation: 486 R Axis:   28 Text Interpretation:  Sinus tachycardia Left bundle branch block No old tracing to compare Confirmed by Linwood DibblesKnapp, Donnis Phaneuf [phone]([id number]) on 05/05/2017 2:09:20 PM   Radiology No results found.  Procedures .Critical Care Performed by: Linwood DibblesKnapp, Jebediah Macrae, MD Authorized by: Linwood DibblesKnapp, Kamarrion Stfort, MD   Critical care provider statement:    Critical care time (minutes):  30   Critical care was time spent personally by me on the following activities:  Discussions with consultants, evaluation of patient's response to treatment, examination of patient, ordering and performing treatments and interventions, ordering and review of laboratory studies, ordering and review of radiographic studies, pulse oximetry,  re-evaluation of patient's condition, obtaining history from patient or surrogate and review of old charts   (including critical care time)  Medications Ordered in ED Medications  sodium chloride 0.9 % bolus 1,000 mL (0 mLs Intravenous Stopped 05/05/17 1440)    And  sodium chloride 0.9 % bolus 1,000 mL (1,000 mLs Intravenous New Bag/Given 05/05/17 1418)    And  sodium chloride 0.9 % bolus 1,000 mL (has no administration in time range)  potassium chloride 10 mEq in 100 mL IVPB (has no administration in time range)  piperacillin-tazobactam (ZOSYN) IVPB 3.375 g (0 g Intravenous Stopped 05/05/17 1439)  fentaNYL (SUBLIMAZE) injection 50 mcg (  50 mcg Intravenous Given 05/05/17 1409)  ondansetron (ZOFRAN) injection 4 mg (4 mg Intravenous Given 05/05/17 1409)     Initial Impression / Assessment and Plan / ED Course  I have reviewed the triage vital signs and the nursing notes.  Pertinent labs & imaging results that were available during my care of the patient were reviewed by me and considered in my medical decision making (see chart for details).  Clinical Course as of May 06 1614  Mon May 05, 2017  1514 Patient's blood pressure is still borderline low.  Some improvement with IV fluids.  White blood cell count is significantly elevated.  Electrolyte panel shows renal insufficiency.  We will continue with IV fluids and empiric antibiotics.  CT scan is pending.   [JK]    Clinical Course User Index [JK] Linwood Dibbles, MD   Patient presented to the emergency room for evaluation of abdominal pain, hypotension,.  Patient symptoms are concerning for the possibility of diverticulitis and sepsis.  Patient was started on broad-spectrum antibiotics and IV fluids.  Blood pressure is improved slightly.  Formal CT scan result is still pending but preliminary result by the radiologist indicates probable diverticular abscess and perforated viscus.  Dr.  Erma Heritage is going to consult with surgery and arrange for  admission to the hospital.  Final Clinical Impressions(s) / ED Diagnoses   Final diagnoses:  Diverticulitis of large intestine with abscess without bleeding      Linwood Dibbles, MD 05/05/17 1616

## 2017-05-05 NOTE — ED Provider Notes (Signed)
Assumed care from Dr. Lynelle DoctorKnapp at 4:09 PM. Briefly, the patient is a 57 y.o. female with PMHx of  has a past medical history of Anemia, Anxiety, Asthma, Depression, GERD (gastroesophageal reflux disease), Hypertension, Insomnia, and Migraine. here with abd pain, diffuse, with n/v.   Labs Reviewed  COMPREHENSIVE METABOLIC PANEL - Abnormal; Notable for the following components:      Result Value   Sodium 131 (*)    Potassium 3.3 (*)    Chloride 91 (*)    Glucose, Bld 137 (*)    BUN 29 (*)    Creatinine, Ser 1.84 (*)    Albumin 3.1 (*)    Alkaline Phosphatase 186 (*)    Total Bilirubin 3.7 (*)    GFR calc non Af Amer 29 (*)    GFR calc Af Amer 34 (*)    All other components within normal limits  CBC WITH DIFFERENTIAL/PLATELET - Abnormal; Notable for the following components:   WBC 20.2 (*)    RBC 3.82 (*)    Hemoglobin 11.9 (*)    HCT 35.2 (*)    Platelets 431 (*)    Neutro Abs 17.7 (*)    Monocytes Absolute 1.3 (*)    All other components within normal limits  CULTURE, BLOOD (ROUTINE X 2)  CULTURE, BLOOD (ROUTINE X 2)  URINE CULTURE  URINALYSIS, ROUTINE W REFLEX MICROSCOPIC  I-STAT CG4 LACTIC ACID, ED    Course of Care: -CT scan shows viscous perf with free air. CODE SEPSIS was initiated, IVF and IV ABX given. Sepsis re-eval completed, pt mentating well, peripheral flow appears intact - cap refill <2 seconds, mentating well. Gen Surg consulted.   Clinical Impression: 1. Diverticulitis of large intestine with abscess without bleeding     Disposition: Admit  This note was prepared with assistance of Dragon voice recognition software. Occasional wrong-word or sound-a-like substitutions may have occurred due to the inherent limitations of voice recognition software.  Shaune Pollack.     Yessika Otte, MD 05/05/17 1630

## 2017-05-05 NOTE — ED Notes (Signed)
Surgical PA at bedside  

## 2017-05-05 NOTE — Progress Notes (Signed)
Patient ID: Kim Snow, female   DOB: 09-14-1960, 57 y.o.   MRN: [id number]  Request received for CT guided drainage of pelvic abscess in pt. Will plan review of images with IR MD in am. Will need to remain off heparin injections until after above procedure.

## 2017-05-05 NOTE — ED Triage Notes (Signed)
Patient sent from [facility] to r/o SBO. States blood work and XR done this morning. Abdominal pain x3days. Last BM yesterday. Hypotensive in triage.

## 2017-05-06 ENCOUNTER — Other Ambulatory Visit: Payer: Self-pay

## 2017-05-06 ENCOUNTER — Inpatient Hospital Stay (HOSPITAL_COMMUNITY): Payer: BC Managed Care – PPO

## 2017-05-06 DIAGNOSIS — K5720 Diverticulitis of large intestine with perforation and abscess without bleeding: Secondary | ICD-10-CM | POA: Diagnosis present

## 2017-05-06 LAB — COMPREHENSIVE METABOLIC PANEL
ALBUMIN: 2.3 g/dL — AB (ref 3.5–5.0)
ALT: 18 U/L (ref 14–54)
AST: 12 U/L — AB (ref 15–41)
Alkaline Phosphatase: 131 U/L — ABNORMAL HIGH (ref 38–126)
Anion gap: 8 (ref 5–15)
BILIRUBIN TOTAL: 2.3 mg/dL — AB (ref 0.3–1.2)
BUN: 22 mg/dL — AB (ref 6–20)
CHLORIDE: 104 mmol/L (ref 101–111)
CO2: 24 mmol/L (ref 22–32)
CREATININE: 1.11 mg/dL — AB (ref 0.44–1.00)
Calcium: 7.9 mg/dL — ABNORMAL LOW (ref 8.9–10.3)
GFR calc Af Amer: >60 mL/min (ref >60)
GFR, EST NON AFRICAN AMERICAN: 54 mL/min — AB (ref >60)
GLUCOSE: 118 mg/dL — AB (ref 65–99)
Potassium: 3.1 mmol/L — ABNORMAL LOW (ref 3.5–5.1)
Sodium: 136 mmol/L (ref 135–145)
TOTAL PROTEIN: 5.9 g/dL — AB (ref 6.5–8.1)

## 2017-05-06 LAB — CBC
HCT: 26 % — ABNORMAL LOW (ref 36.0–46.0)
Hemoglobin: 9 g/dL — ABNORMAL LOW (ref 12.0–15.0)
MCH: 31.5 pg (ref 26.0–34.0)
MCHC: 34.6 g/dL (ref 30.0–36.0)
MCV: 90.9 fL (ref 78.0–100.0)
PLATELETS: 360 10*3/uL (ref 150–400)
RBC: 2.86 MIL/uL — ABNORMAL LOW (ref 3.87–5.11)
RDW: 12.9 % (ref 11.5–15.5)
WBC: 6.1 10*3/uL (ref 4.0–10.5)

## 2017-05-06 LAB — APTT: aPTT: 29 seconds (ref 24–36)

## 2017-05-06 LAB — PROTIME-INR
INR: 1.04
Prothrombin Time: 13.5 seconds (ref 11.4–15.2)

## 2017-05-06 IMAGING — CT CT IMAGE GUIDED DRAINAGE BY PERCUTANEOUS CATHETER
2 of 4 series · 12 of 32 positions shown, 18 images · non-contrast
Comparison: none

INDICATION: 56-year-old female with pelvic abscess, likely secondary to
diverticulitis
TECHNIQUE: Informed written consent was obtained from the patient after a
thorough discussion of the procedural risks, benefits and
alternatives. All questions were addressed. Maximal Sterile Barrier
Technique was utilized including caps, mask, sterile gowns, sterile
gloves, sterile drape, hand hygiene and skin antiseptic. A timeout
was performed prior to the initiation of the procedure.

[Series 2: i-spiral 5.0 b31f · axial · 0.86mm/px · z∈[-530,-510]mm · 3 of 30 slices shown (1 of 2)]
[im 4/30  soft-tissue]
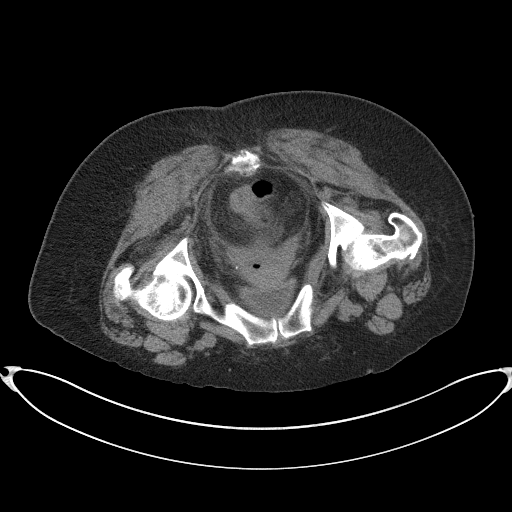
[im 7/30  soft-tissue]
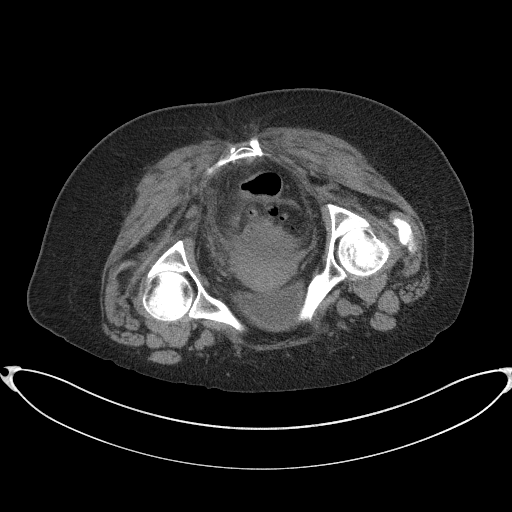
[im 10/30  soft-tissue]
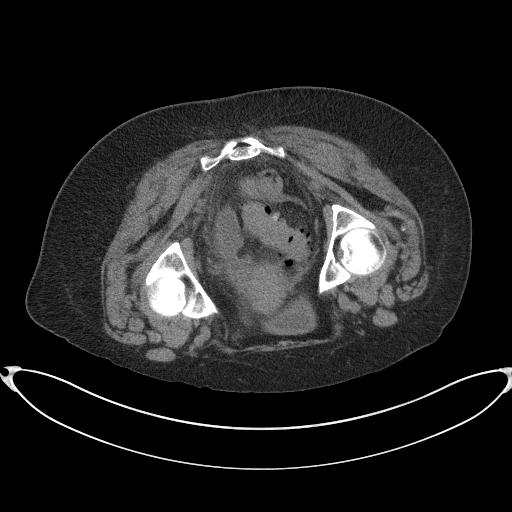

[Series 5: i-spiral 5.0 b31f · axial · 0.86mm/px · z∈[-549,-465]mm · 9 of 31 slices shown, 15 images (2 of 2)]
[im 4/31  soft-tissue]
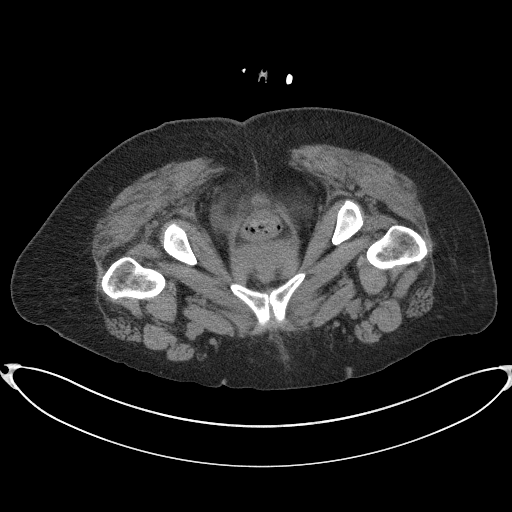
[im 4/31  bone]
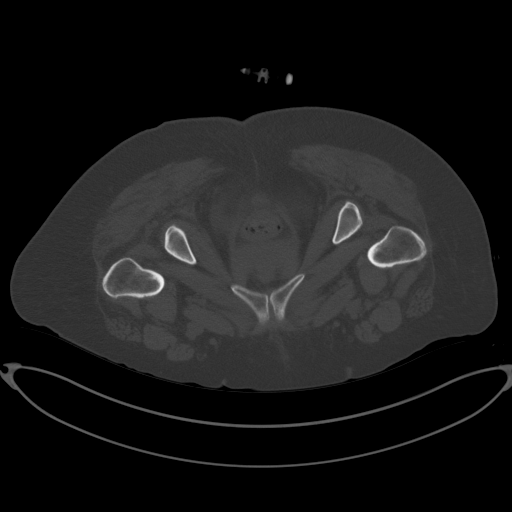
[im 7/31  soft-tissue]
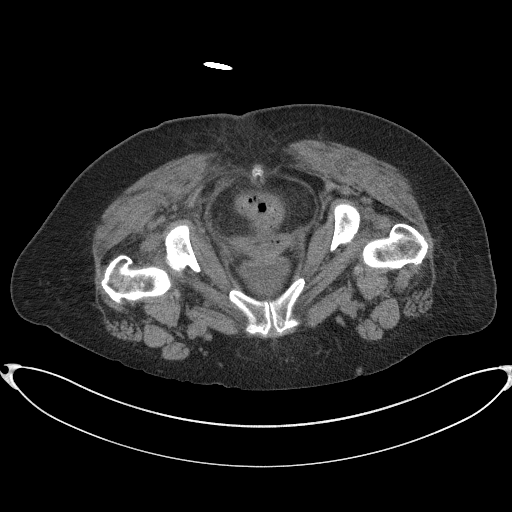
[im 10/31  soft-tissue]
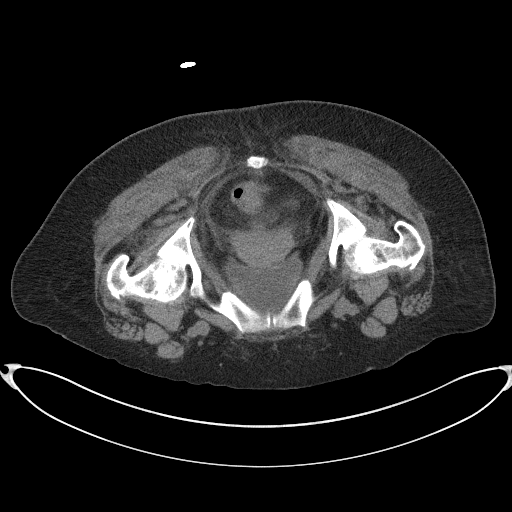
[im 13/31  soft-tissue]
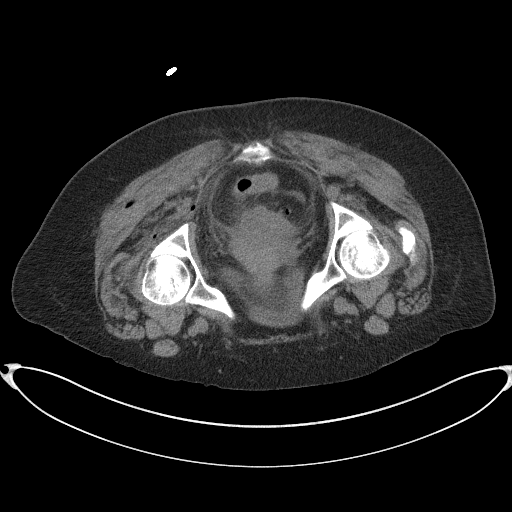
[im 16/31  soft-tissue]
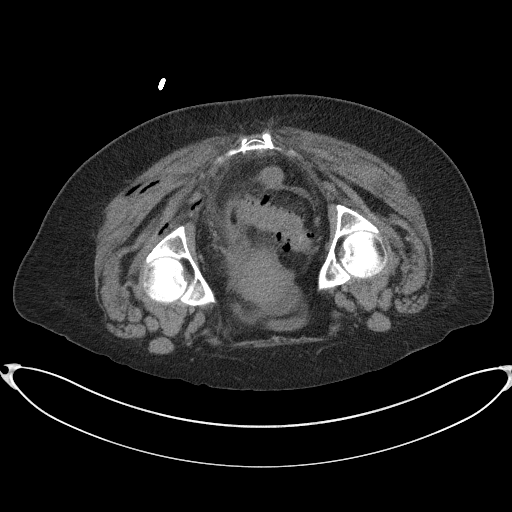
[im 19/31  soft-tissue]
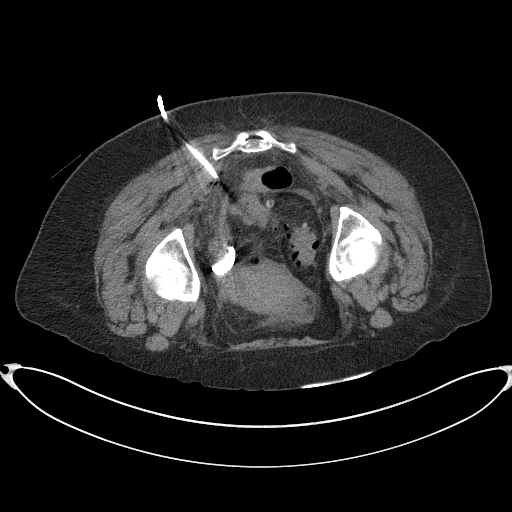
[im 19/31  lung]
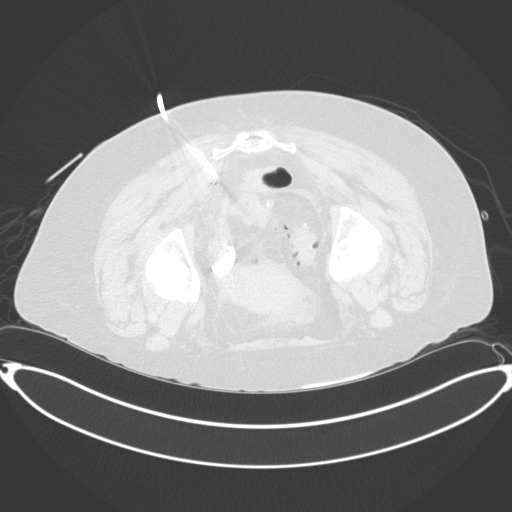
[im 22/31  soft-tissue]
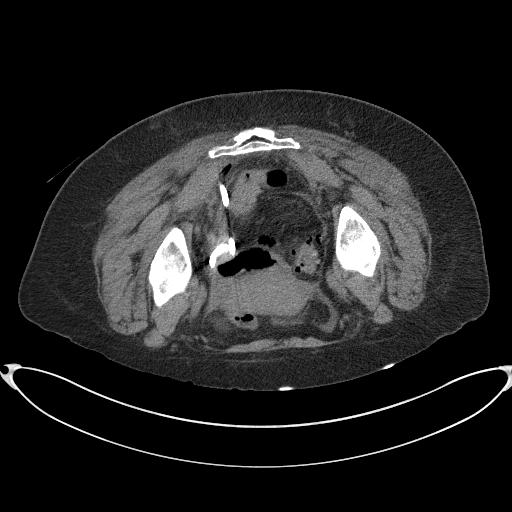
[im 22/31  lung]
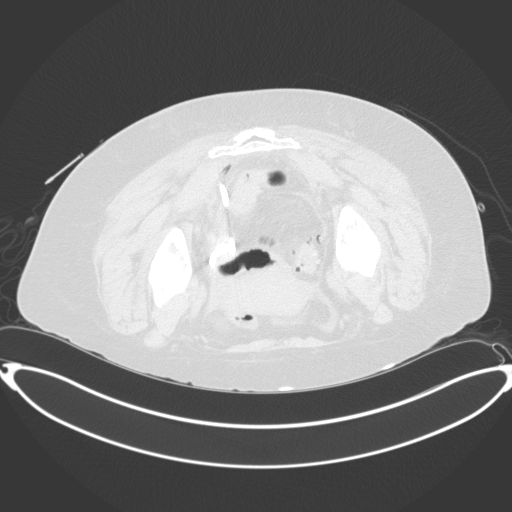
[im 25/31  soft-tissue]
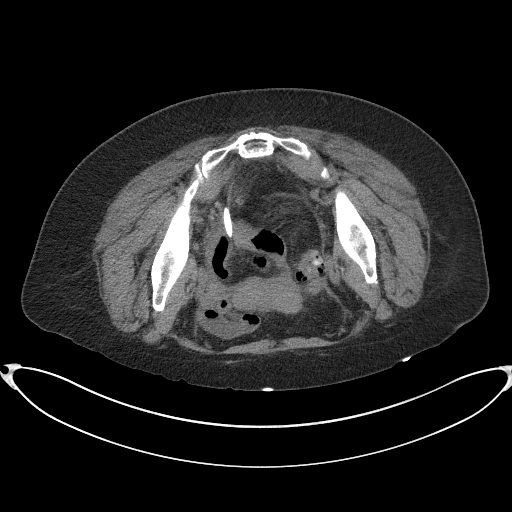
[im 25/31  lung]
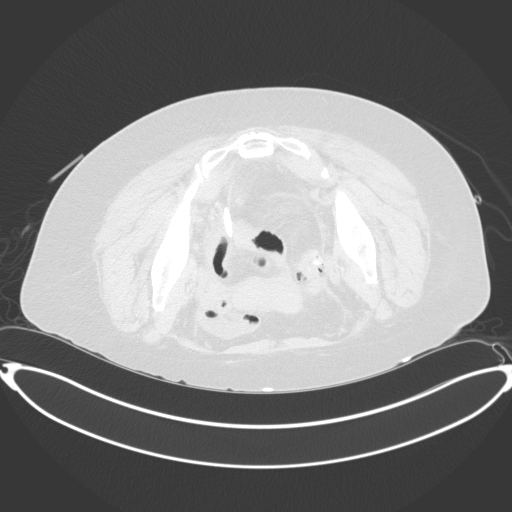
[im 28/31  soft-tissue]
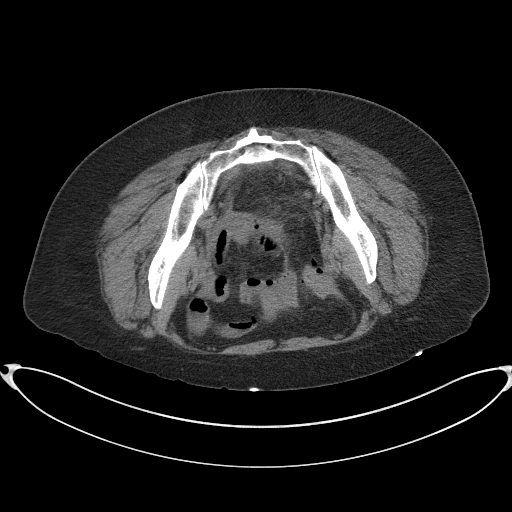
[im 28/31  lung]
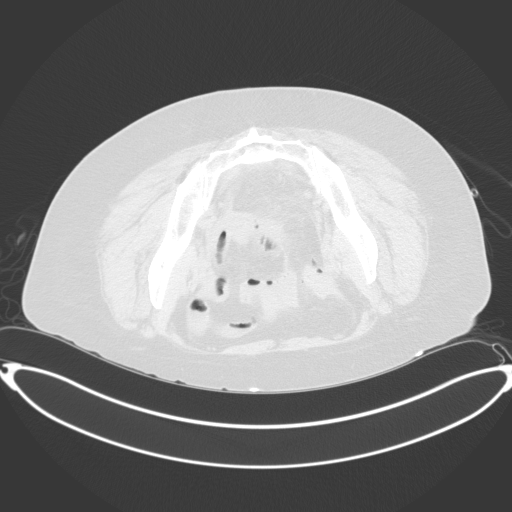
[im 28/31  bone]
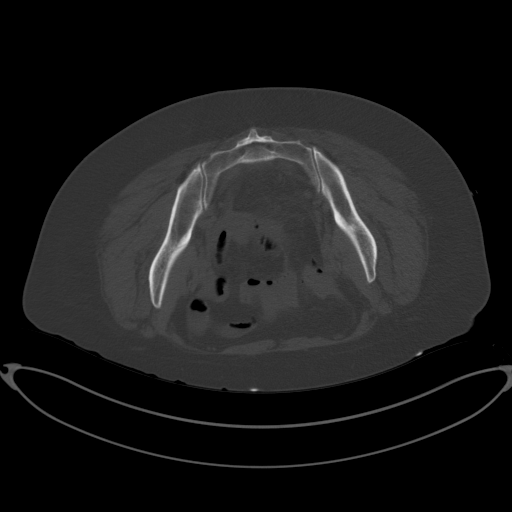

[12 of 32 positions shown; findings below may reference images not displayed]

EXAM:
CT GUIDED DRAINAGE OF  ABSCESS

MEDICATIONS:
The patient is currently admitted to the hospital and receiving
intravenous antibiotics. The antibiotics were administered within an
appropriate time frame prior to the initiation of the procedure.

ANESTHESIA/SEDATION:
2.5 mg IV Versed 150 mcg IV Fentanyl

Moderate Sedation Time:  19 minutes

The patient was continuously monitored during the procedure by the
interventional radiology nurse under my direct supervision.

COMPLICATIONS:
None
PROCEDURE:
The operative field was prepped with Chlorhexidine in a sterile
fashion, and a sterile drape was applied covering the operative
field. A sterile gown and sterile gloves were used for the
procedure. Local anesthesia was provided with 1% Lidocaine.

Patient is position prone position on the CT gantry table. Scout CT
was acquired for planning purposes.

The patient is prepped and draped in the usual sterile fashion. The
skin and subcutaneous tissues were generously infiltrated 1%
lidocaine for local anesthesia.

Using modified Seldinger technique, a 12 French pigtail drainage
catheter was placed via a right transgluteal approach into the
abscess cavity.

Proximally 60 cc of purulent material was aspirated.

Pigtail drainage catheter attached to bulb suction.

Patient tolerated the procedure well and remained hemodynamically
stable throughout.

No complications were encountered and no significant blood loss.
FINDINGS: Scout CT demonstrates gas and fluid collection within the right
aspect of the pelvis.

Final CT image demonstrates placement of 12 French drain into the
fluid collection with relatively complete decompression.

Approximately 60 cc of purulent material aspirated.
IMPRESSION: Status post CT-guided drainage of pelvic abscess, right trans
gluteal approach.

## 2017-05-06 MED ORDER — MIDAZOLAM HCL 2 MG/2ML IJ SOLN
INTRAMUSCULAR | Status: AC | PRN
  Administered 2017-05-06: 0.5 mg via INTRAVENOUS
  Administered 2017-05-06 (×2): 1 mg via INTRAVENOUS

## 2017-05-06 MED ORDER — FENTANYL CITRATE (PF) 100 MCG/2ML IJ SOLN
INTRAMUSCULAR | Status: AC
  Filled 2017-05-06: qty 4

## 2017-05-06 MED ORDER — LIDOCAINE HCL 1 % IJ SOLN
INTRAMUSCULAR | Status: AC | PRN
  Administered 2017-05-06: 10 mL via INTRADERMAL

## 2017-05-06 MED ORDER — FLUMAZENIL 0.5 MG/5ML IV SOLN
INTRAVENOUS | Status: AC
  Filled 2017-05-06: qty 5

## 2017-05-06 MED ORDER — FENTANYL CITRATE (PF) 100 MCG/2ML IJ SOLN
INTRAMUSCULAR | Status: AC | PRN
  Administered 2017-05-06 (×3): 50 ug via INTRAVENOUS

## 2017-05-06 MED ORDER — SODIUM CHLORIDE 0.9% FLUSH
5.0000 mL | Freq: Three times a day (TID) | INTRAVENOUS | Status: DC
Start: 2017-05-06 — End: 2017-05-11
  Administered 2017-05-06 – 2017-05-11 (×14): 5 mL

## 2017-05-06 MED ORDER — NALOXONE HCL 0.4 MG/ML IJ SOLN
INTRAMUSCULAR | Status: AC
  Filled 2017-05-06: qty 1

## 2017-05-06 MED ORDER — MIDAZOLAM HCL 2 MG/2ML IJ SOLN
INTRAMUSCULAR | Status: AC
  Filled 2017-05-06: qty 4

## 2017-05-06 MED ORDER — ENOXAPARIN SODIUM 40 MG/0.4ML ~~LOC~~ SOLN
40.0000 mg | SUBCUTANEOUS | Status: DC
  Administered 2017-05-06 – 2017-05-10 (×5): 40 mg via SUBCUTANEOUS
  Filled 2017-05-06 (×5): qty 0.4

## 2017-05-06 MED ORDER — POTASSIUM CHLORIDE 10 MEQ/100ML IV SOLN
10.0000 meq | INTRAVENOUS | Status: AC
  Administered 2017-05-06 (×5): 10 meq via INTRAVENOUS
  Filled 2017-05-06 (×3): qty 100

## 2017-05-06 NOTE — Consult Note (Signed)
Chief Complaint: Patient was seen in consultation today for CT-guided drainage of pelvic abscess Chief Complaint  Patient presents with  . Abdominal Pain    Referring Physician(s): Wilson,E  Supervising Physician: Gilmer MorWagner, Jaime  Patient Status: [facility] - ED  History of Present Illness: Kim Snow is a 57 y.o. female with past medical history of hypertension, GERD, anxiety, iron deficiency anemia, depression, who was [facility] to [facility] on 05/05/17 with several day history of abdominal pain, nausea, fever, diarrhea.  Subsequent imaging revealed multiple gas locules within the lower abdominal and pelvic mesentery consistent with hollow viscus perforation with gas and fluid collection in the right pelvis suspicious for abscess.  There were multiple loops of fluid-filled dilated small bowel consistent with mechanical bowel obstruction.  Request now received from surgery for CT-guided drainage of the right pelvic abscess.  Current labs include WBC 6.1, hemoglobin 9, platelets 360k, creatinine 1.11, potassium 3.1, PT 18.5, INR 1.04.  BP's are a little soft and she is slightly tachycardic.  Currently afebrile.  Past Medical History:  Diagnosis Date  . Anemia   . Anxiety   . Asthma   . Depression   . GERD (gastroesophageal reflux disease)   . Hypertension   . Insomnia   . Migraine     Past Surgical History:  Procedure Laterality Date  . CESAREAN SECTION    . CHOLECYSTECTOMY    . KNEE ARTHROSCOPY      Allergies: Patient has no known allergies.  Medications: Prior to Admission medications   Medication Sig Start Date End Date Taking? Authorizing Provider  ALPRAZolam Prudy Feeler(XANAX) 1 MG tablet Take 1 mg by mouth. .5 MG IN THE AM, .5 MG IN THE AFTERNOON AND .5 MG AT BEDTIME 02/11/17  Yes [provider]  CALCIUM PO Take 1,000 mg by mouth daily.   Yes [provider]  Cyanocobalamin (VITAMIN B 12 PO) Take 1 tablet by mouth daily.   Yes [provider]  losartan-hydrochlorothiazide (HYZAAR) 100-25 MG tablet Take 1 tablet by mouth daily. 03/11/17  Yes [provider]  meloxicam (MOBIC) 15 MG tablet Take 15 mg by mouth daily at 12 noon. 04/21/17  Yes [provider]  omeprazole (PRILOSEC) 40 MG capsule Take 40 mg by mouth daily. 03/28/17  Yes [provider]  Polysaccharide Iron Complex (IFEREX 150 PO) Take 150 mg by mouth daily.   Yes [provider]  PREMARIN vaginal cream Place 1 Applicatorful vaginally Nightly. 04/30/17  Yes [provider]     History reviewed. No pertinent family history.  Social History   Socioeconomic History  . Marital status: Married    Spouse name: Not on file  . Number of children: Not on file  . Years of education: Not on file  . Highest education level: Not on file  Occupational History  . Not on file  Social Needs  . Financial resource strain: Not on file  . Food insecurity:    Worry: Not on file    Inability: Not on file  . Transportation needs:    Medical: Not on file    Non-medical: Not on file  Tobacco Use  . Smoking status: Never Smoker  . Smokeless tobacco: Never Used  Substance and Sexual Activity  . Alcohol use: Yes    Comment: social  . Drug use: Never  . Sexual activity: Not on file  Lifestyle  . Physical activity:    Days per week: Not on file    Minutes  per session: Not on file  . Stress: Not on file  Relationships  . Social connections:    Talks on phone: Not on file    Gets together: Not on file    Attends religious service: Not on file    Active member of club or organization: Not on file    Attends meetings of clubs or organizations: Not on file    Relationship status: Not on file  Other Topics Concern  . Not on file  Social History Narrative  . Not on file      Review of Systems see above: Also with intermittent headaches, some dyspnea with exertion; denies vomiting or abnormal bleeding.  Vital Signs: BP  113/65   Pulse 87   Temp 99.7 F (37.6 C) (Oral)   Resp 17   Ht 5\' 5"  (1.651 m)   Wt 160 lb (72.6 kg)   SpO2 99%   BMI 26.63 kg/m   Physical Exam awake, alert.  Chest clear to auscultation bilaterally.  Heart with slightly tachycardic but regular rhythm.  Abdomen soft, mildly distended, mild generalized tenderness, predominantly in pelvic region, positive bowel sounds.  Trace LE edema bilaterally.  Imaging: Ct Abdomen Pelvis Wo Contrast  Result Date: 05/05/2017 CLINICAL DATA:  Diffuse abdominal pain for 4 days with nausea and diarrhea EXAM: CT ABDOMEN AND PELVIS WITHOUT CONTRAST TECHNIQUE: Multidetector CT imaging of the abdomen and pelvis was performed following the standard protocol without IV contrast. COMPARISON:  Radiograph 05/05/2017 FINDINGS: Lower chest: Lung bases demonstrate no consolidation or pleural effusion. The heart size is within normal limits. Small hiatal hernia Hepatobiliary: No focal liver abnormality is seen. Status post cholecystectomy. No biliary dilatation. Pancreas: Unremarkable. No pancreatic ductal dilatation or surrounding inflammatory changes. Spleen: Normal in size without focal abnormality. Adrenals/Urinary Tract: Adrenal glands are unremarkable. Kidneys are normal, without renal calculi, focal lesion, or hydronephrosis. Bladder is unremarkable. Stomach/Bowel: Multiple loops of dilated fluid-filled small bowel measuring up to 4.1 cm consistent with small bowel obstruction. Transition point visualized within the central pelvis slightly to the right of midline. Normal appendix. Sigmoid colon diverticular disease with some wall thickening present. Perforated viscus as evidence by multiple extraluminal gas collections within the lower abdominal and pelvic mesentery. Possible source at the distal sigmoid colon, series 2, image number 7 and coronal series 5, image number 72. Mildly thickened bowel loop with gas adjacent to it, series 2, image number 64. Vascular/Lymphatic:  Mild aortic atherosclerosis. No aneurysmal dilatation. Mild retroperitoneal lymph nodes. Reproductive: Uterus and bilateral adnexa are unremarkable. Other: Small amount of free fluid in the pelvis. Right pelvic gas and fluid collection measuring 7.8 x 4.1 cm, appears contiguous on coronal views with gas and fluid adjacent to the sigmoid colon. Musculoskeletal: No acute or significant osseous findings. IMPRESSION: 1. Multiple gas locules within the lower abdominal and pelvic mesentery consistent with hollow viscus perforation. Suspected source is distal sigmoid colon diverticulitis where there is gas and fluid that appears contiguous with a gas and fluid collection in the right pelvis measuring 7.8 x 4.1 cm, suspect for abscess. 2. Multiple loops of fluid-filled dilated small bowel consistent with mechanical bowel obstruction; transition point is in the central pelvis slightly to the right of midline, adjacent to the gas and fluid collection in the right pelvis. Critical Value/emergent results were called by telephone at the time of interpretation on 05/05/2017 at 4:14 pm to Dr. Linwood Dibbles , who verbally acknowledged these results. Electronically Signed   By: Adrian Prows.D.  On: 05/05/2017 16:14    Labs:  CBC: Recent Labs    05/05/17 1328 05/06/17 0539  WBC 20.2* 6.1  HGB 11.9* 9.0*  HCT 35.2* 26.0*  PLT 431* 360    COAGS: Recent Labs    05/06/17 0539  INR 1.04  APTT 29    BMP: Recent Labs    05/05/17 1328 05/06/17 0539  NA 131* 136  K 3.3* 3.1*  CL 91* 104  CO2 25 24  GLUCOSE 137* 118*  BUN 29* 22*  CALCIUM 9.0 7.9*  CREATININE 1.84* 1.11*  GFRNONAA 29* 54*  GFRAA 34* >60    LIVER FUNCTION TESTS: Recent Labs    05/05/17 1328 05/06/17 0539  BILITOT 3.7* 2.3*  AST 27 12*  ALT 25 18  ALKPHOS 186* 131*  PROT 7.8 5.9*  ALBUMIN 3.1* 2.3*    TUMOR MARKERS: No results for input(s): AFPTM, CEA, CA199, CHROMGRNA in the last 8760 hours.  Assessment and Plan:  57 y.o.  female with past medical history of hypertension, GERD, anxiety, iron deficiency anemia, depression, who was admitted to [facility] on 05/05/17 with several day history of abdominal pain, nausea, fever, diarrhea.  Subsequent imaging revealed multiple gas locules within the lower abdominal and pelvic mesentery consistent with hollow viscus perforation with gas and fluid collection in the right pelvis suspicious for abscess.  There were multiple loops of fluid-filled dilated small bowel consistent with mechanical bowel obstruction.  Request now received from surgery for CT-guided drainage of the right pelvic abscess.  Current labs include WBC 6.1, hemoglobin 9, platelets 360k, creatinine 1.11, potassium 3.1, PT 18.5, INR 1.04.  BP's are a little soft and she is slightly tachycardic.  Currently afebrile.  Imaging studies have been reviewed by Dr. Loreta Ave and pelvic abscess appears amenable to drainage.Risks and benefits discussed with the patient including bleeding, infection, damage to adjacent structures, bowel perforation/fistula connection, and sepsis.  All of the patient's questions were answered, patient is agreeable to proceed. Consent signed and in chart.  Procedure scheduled for later today.  Thank you for this interesting consult.  I greatly enjoyed meeting Kim Snow and look forward to participating in their care.  A copy of this report was sent to the requesting provider on this date.  Electronically Signed: D. Jeananne Rama, PA-C 05/06/2017, 9:27 AM   I spent a total of  30 minutes   in face to face in clinical consultation, greater than 50% of which was counseling/coordinating care for CT-guided drainage of pelvic abscess

## 2017-05-06 NOTE — ED Notes (Signed)
Bed: WA17 Expected date:  Expected time:  Means of arrival:  Comments: For hold patient

## 2017-05-06 NOTE — ED Notes (Signed)
[phone] Rosana Fretlena Guise (mother)

## 2017-05-06 NOTE — Discharge Instructions (Signed)
Percutaneous Abscess Drain, Care After °This sheet gives you information about how to care for yourself after your procedure. Your health care provider may also give you more specific instructions. If you have problems or questions, contact your health care provider. °What can I expect after the procedure? °After your procedure, it is common to have: °· A small amount of bruising and discomfort in the area where the drainage tube (catheter) was placed. °· Sleepiness and fatigue. This should go away after the medicines you were given have worn off. ° °Follow these instructions at home: °Incision care °· Follow instructions from your health care provider about how to take care of your incision. Make sure you: °? Wash your hands with soap and water before you change your bandage (dressing). If soap and water are not available, use hand sanitizer. °? Change your dressing as told by your health care provider. °? Leave stitches (sutures), skin glue, or adhesive strips in place. These skin closures may need to stay in place for 2 weeks or longer. If adhesive strip edges start to loosen and curl up, you may trim the loose edges. Do not remove adhesive strips completely unless your health care provider tells you to do that. °· Check your incision area every day for signs of infection. Check for: °? More redness, swelling, or pain. °? More fluid or blood. °? Warmth. °? Pus or a bad smell. °? Fluid leaking from around your catheter (instead of fluid draining through your catheter). °Catheter care °· Follow instructions from your health care provider about emptying and cleaning your catheter and collection bag. You may need to clean the catheter every day so it does not clog. °· If directed, write down the following information every time you empty your bag: °? The date and time. °? The amount of drainage. °General instructions °· Rest at home for 1-2 days after your procedure. Return to your normal activities as told by your  health care provider. °· Do not take baths, swim, or use a hot tub for 24 hours after your procedure, or until your health care provider says that this is okay. °· Take over-the-counter and prescription medicines only as told by your health care provider. °· Keep all follow-up visits as told by your health care provider. This is important. °Contact a health care provider if: °· You have less than 10 mL of drainage a day for 2-3 days in a row, or as directed by your health care provider. °· You have more redness, swelling, or pain around your incision area. °· You have more fluid or blood coming from your incision area. °· Your incision area feels warm to the touch. °· You have pus or a bad smell coming from your incision area. °· You have fluid leaking from around your catheter (instead of through your catheter). °· You have a fever or chills. °· You have pain that does not get better with medicine. °Get help right away if: °· Your catheter comes out. °· You suddenly stop having drainage from your catheter. °· You suddenly have blood in the fluid that is draining from your catheter. °· You become dizzy or you faint. °· You develop a rash. °· You have nausea or vomiting. °· You have difficulty breathing or you feel short of breath. °· You develop chest pain. °· You have problems with your speech or vision. °· You have trouble balancing or moving your arms or legs. °Summary °· It is common to have a small   amount of bruising and discomfort in the area where the drainage tube (catheter) was placed. °· You may be directed to record the amount of drainage from the bag every time you empty it. °· Follow instructions from your health care provider about emptying and cleaning your catheter and collection bag. °This information is not intended to replace advice given to you by your health care provider. Make sure you discuss any questions you have with your health care provider. °Document Released: 06/14/2013 Document  Revised: 12/21/2015 Document Reviewed: 12/21/2015 °[employer]vier Interactive Patient Education © 2017 [employer] ° °

## 2017-05-06 NOTE — Progress Notes (Signed)
[facility] Progress Note     Subjective: CC:  Abdominal pain slightly improved, still very TPP lower abdomen. Endorses bloating and belching/reflux. Denies nausea or vomiting. Denies flatus or BM. Denies urinary sxs.   200cc urine documented - patient states she is urinating regularly/increased frequency compared to yesterday, it just has not been recorded each time.  During my exam HR 103, BP 103/61 Objective: Vital signs in last 24 hours: Temp:  [98.6 F (37 C)-99.7 F (37.6 C)] 99.7 F (37.6 C) (03/25 1748) Pulse Rate:  [106-123] 111 (03/26 0357) Resp:  [9-20] 17 (03/26 0357) BP: (81-111)/(50-64) 108/58 (03/26 0357) SpO2:  [91 %-100 %] 95 % (03/26 0357) Weight:  [72.6 kg (160 lb)] 72.6 kg (160 lb) (03/25 1349)    Intake/Output from previous day: 03/25 0701 - 03/26 0700 In: 3400 [IV Piggyback:3400] Out: 200 [Urine:200] Intake/Output this shift: No intake/output data recorded.  PE: Gen:  Alert, NAD, cooperative  Eyes: pupil equal and round, anicteric sclerae, no discharge Mouth: oral mucosa pink and moist Card:  Sinus tachycardia, regular rhythm, pedal pulses 2+ BL Pulm:  Normal effort, clear to auscultation bilaterally Abd: Soft, moderate distention, some tympany, TTP lower abdomen, worse over suprapubic region and LLQ, no peritonitis.   Psych: A&Ox3   Lab Results:  Recent Labs    05/05/17 1328 05/06/17 0539  WBC 20.2* 6.1  HGB 11.9* 9.0*  HCT 35.2* 26.0*  PLT 431* 360   BMET Recent Labs    05/05/17 1328 05/06/17 0539  NA 131* 136  K 3.3* 3.1*  CL 91* 104  CO2 25 24  GLUCOSE 137* 118*  BUN 29* 22*  CREATININE 1.84* 1.11*  CALCIUM 9.0 7.9*   PT/INR Recent Labs    05/06/17 0539  LABPROT 13.5  INR 1.04   CMP     Component Value Date/Time   NA 136 05/06/2017 0539   K 3.1 (L) 05/06/2017 0539   CL 104 05/06/2017 0539   CO2 24 05/06/2017 0539   GLUCOSE 118 (H) 05/06/2017 0539   BUN 22 (H) 05/06/2017 0539   CREATININE 1.11 (H)  05/06/2017 0539   CALCIUM 7.9 (L) 05/06/2017 0539   PROT 5.9 (L) 05/06/2017 0539   ALBUMIN 2.3 (L) 05/06/2017 0539   AST 12 (L) 05/06/2017 0539   ALT 18 05/06/2017 0539   ALKPHOS 131 (H) 05/06/2017 0539   BILITOT 2.3 (H) 05/06/2017 0539   GFRNONAA 54 (L) 05/06/2017 0539   GFRAA >60 05/06/2017 0539   Lipase  No results found for: LIPASE     Studies/Results: Ct Abdomen Pelvis Wo Contrast  Result Date: 05/05/2017 CLINICAL DATA:  Diffuse abdominal pain for 4 days with nausea and diarrhea EXAM: CT ABDOMEN AND PELVIS WITHOUT CONTRAST TECHNIQUE: Multidetector CT imaging of the abdomen and pelvis was performed following the standard protocol without IV contrast. COMPARISON:  Radiograph 05/05/2017 FINDINGS: Lower chest: Lung bases demonstrate no consolidation or pleural effusion. The heart size is within normal limits. Small hiatal hernia Hepatobiliary: No focal liver abnormality is seen. Status post cholecystectomy. No biliary dilatation. Pancreas: Unremarkable. No pancreatic ductal dilatation or surrounding inflammatory changes. Spleen: Normal in size without focal abnormality. Adrenals/Urinary Tract: Adrenal glands are unremarkable. Kidneys are normal, without renal calculi, focal lesion, or hydronephrosis. Bladder is unremarkable. Stomach/Bowel: Multiple loops of dilated fluid-filled small bowel measuring up to 4.1 cm consistent with small bowel obstruction. Transition point visualized within the central pelvis slightly to the right of midline. Normal appendix. Sigmoid colon diverticular disease with some wall  thickening present. Perforated viscus as evidence by multiple extraluminal gas collections within the lower abdominal and pelvic mesentery. Possible source at the distal sigmoid colon, series 2, image number 7 and coronal series 5, image number 72. Mildly thickened bowel loop with gas adjacent to it, series 2, image number 64. Vascular/Lymphatic: Mild aortic atherosclerosis. No aneurysmal  dilatation. Mild retroperitoneal lymph nodes. Reproductive: Uterus and bilateral adnexa are unremarkable. Other: Small amount of free fluid in the pelvis. Right pelvic gas and fluid collection measuring 7.8 x 4.1 cm, appears contiguous on coronal views with gas and fluid adjacent to the sigmoid colon. Musculoskeletal: No acute or significant osseous findings. IMPRESSION: 1. Multiple gas locules within the lower abdominal and pelvic mesentery consistent with hollow viscus perforation. Suspected source is distal sigmoid colon diverticulitis where there is gas and fluid that appears contiguous with a gas and fluid collection in the right pelvis measuring 7.8 x 4.1 cm, suspect for abscess. 2. Multiple loops of fluid-filled dilated small bowel consistent with mechanical bowel obstruction; transition point is in the central pelvis slightly to the right of midline, adjacent to the gas and fluid collection in the right pelvis. Critical Value/emergent results were called by telephone at the time of interpretation on 05/05/2017 at 4:14 pm to Dr. Linwood DibblesJON KNAPP , who verbally acknowledged these results. Electronically Signed   By: Jasmine PangKim  Fujinaga M.D.   On: 05/05/2017 16:14    Anti-infectives: Anti-infectives (From admission, onward)   Start     Dose/Rate Route Frequency Ordered Stop   05/05/17 2000  piperacillin-tazobactam (ZOSYN) IVPB 3.375 g     3.375 g 12.5 mL/hr over 240 Minutes Intravenous Every 8 hours 05/05/17 1817     05/05/17 1400  piperacillin-tazobactam (ZOSYN) IVPB 3.375 g     3.375 g 100 mL/hr over 30 Minutes Intravenous  Once 05/05/17 1351 05/05/17 1439     Assessment/Plan Perforated sigmoid diverticulitis with abscess - continue NPO, IVF  - IV Zosyn  - IR consult to place percutaneous drain  - mobilize/IS   AKI - BUN 22 /SCr 1.11 improving with hydration, follow hyperbilirubinemia - 2.3 from 3.1; suspect 2/2 to acute liver injury/shock liver, repeat CMET in AM HTN - PRN meds GERD - PPI  FEN:  NPO, IVF; hypokalemia (3.1) - give 5 runs IV KCl, hyponatremia resolved  ID: Zosyn 3/25 >> VTE: SCD's, start chemical VTE s/p perc drain Foley: none    LOS: 1 day    Adam PhenixElizabeth S Maxamus Colao , [facility] 05/06/2017, 7:52 AM Pager: [phone] Consults: [phone] Mon-Fri 7:00 am-4:30 pm Sat-Sun 7:00 am-11:30 am

## 2017-05-06 NOTE — Procedures (Signed)
Interventional Radiology Procedure Note  Procedure: Image guided drain placement. 22F drain placed Complications: None Recommendations: - Routine drain care - Routine wound care - Record output daily - Flush drain with sterile saline daily   Signed,  Gilmer MorJaime Stepanie Graver, DO

## 2017-05-07 LAB — CBC
HEMATOCRIT: 30.1 % — AB (ref 36.0–46.0)
Hemoglobin: 9.9 g/dL — ABNORMAL LOW (ref 12.0–15.0)
MCH: 30.6 pg (ref 26.0–34.0)
MCHC: 32.9 g/dL (ref 30.0–36.0)
MCV: 92.9 fL (ref 78.0–100.0)
Platelets: 338 10*3/uL (ref 150–400)
RBC: 3.24 MIL/uL — AB (ref 3.87–5.11)
RDW: 13.4 % (ref 11.5–15.5)
WBC: 9.9 10*3/uL (ref 4.0–10.5)

## 2017-05-07 LAB — COMPREHENSIVE METABOLIC PANEL
ALT: 18 U/L (ref 14–54)
ANION GAP: 11 (ref 5–15)
AST: 17 U/L (ref 15–41)
Albumin: 2.3 g/dL — ABNORMAL LOW (ref 3.5–5.0)
Alkaline Phosphatase: 125 U/L (ref 38–126)
BILIRUBIN TOTAL: 1 mg/dL (ref 0.3–1.2)
BUN: 16 mg/dL (ref 6–20)
CO2: 23 mmol/L (ref 22–32)
Calcium: 8.2 mg/dL — ABNORMAL LOW (ref 8.9–10.3)
Chloride: 102 mmol/L (ref 101–111)
Creatinine, Ser: 0.86 mg/dL (ref 0.44–1.00)
Glucose, Bld: 125 mg/dL — ABNORMAL HIGH (ref 65–99)
POTASSIUM: 3.4 mmol/L — AB (ref 3.5–5.1)
Sodium: 136 mmol/L (ref 135–145)
TOTAL PROTEIN: 6.5 g/dL (ref 6.5–8.1)

## 2017-05-07 LAB — URINE CULTURE

## 2017-05-07 LAB — HIV ANTIBODY (ROUTINE TESTING W REFLEX): HIV Screen 4th Generation wRfx: NONREACTIVE

## 2017-05-07 MED ORDER — POTASSIUM CHLORIDE 10 MEQ/100ML IV SOLN
10.0000 meq | INTRAVENOUS | Status: AC
  Administered 2017-05-07 (×2): 10 meq via INTRAVENOUS
  Filled 2017-05-07: qty 100

## 2017-05-07 NOTE — Progress Notes (Signed)
Referring Physician(s): Wilson,E  Supervising Physician: Ruel Favors  Patient Status:  [name] - In-pt  Chief Complaint:  Pelvic abscess  Subjective: Pt doing so so today; has had few episodes of dizziness with N/V since yesterday; sore at rt TG drain site; has voided and had BM   Allergies: Patient has no known allergies.  Medications: Prior to Admission medications   Medication Sig Start Date End Date Taking? Authorizing Provider  ALPRAZolam Prudy Feeler) 1 MG tablet Take 1 mg by mouth. .5 MG IN THE AM, .5 MG IN THE AFTERNOON AND .5 MG AT BEDTIME 02/11/17  Yes [provider]  CALCIUM PO Take 1,000 mg by mouth daily.   Yes [provider]  Cyanocobalamin (VITAMIN B 12 PO) Take 1 tablet by mouth daily.   Yes [provider]  losartan-hydrochlorothiazide (HYZAAR) 100-25 MG tablet Take 1 tablet by mouth daily. 03/11/17  Yes [provider]  meloxicam (MOBIC) 15 MG tablet Take 15 mg by mouth daily at 12 noon. 04/21/17  Yes [provider]  omeprazole (PRILOSEC) 40 MG capsule Take 40 mg by mouth daily. 03/28/17  Yes [provider]  Polysaccharide Iron Complex (IFEREX 150 PO) Take 150 mg by mouth daily.   Yes [provider]  PREMARIN vaginal cream Place 1 Applicatorful vaginally Nightly. 04/30/17  Yes [provider]     Vital Signs: BP 129/71 (BP Location: Right Arm)   Pulse 96   Temp 98.5 F (36.9 C) (Oral) Comment: Patient c/o dizzy in bed.  Resp 18   Ht 5\' 5"  (1.651 m)   Wt 160 lb (72.6 kg)   SpO2 99%   BMI 26.63 kg/m   Physical Exam rt TG drain intact, output 90 cc since yesterday, cx pend; dressing dry, site mildly tender  Imaging: Ct Abdomen Pelvis Wo Contrast  Result Date: 05/05/2017 CLINICAL DATA:  Diffuse abdominal pain for 4 days with nausea and diarrhea EXAM: CT ABDOMEN AND PELVIS WITHOUT CONTRAST TECHNIQUE: Multidetector CT imaging of the abdomen and pelvis was performed following the standard  protocol without IV contrast. COMPARISON:  Radiograph 05/05/2017 FINDINGS: Lower chest: Lung bases demonstrate no consolidation or pleural effusion. The heart size is within normal limits. Small hiatal hernia Hepatobiliary: No focal liver abnormality is seen. Status post cholecystectomy. No biliary dilatation. Pancreas: Unremarkable. No pancreatic ductal dilatation or surrounding inflammatory changes. Spleen: Normal in size without focal abnormality. Adrenals/Urinary Tract: Adrenal glands are unremarkable. Kidneys are normal, without renal calculi, focal lesion, or hydronephrosis. Bladder is unremarkable. Stomach/Bowel: Multiple loops of dilated fluid-filled small bowel measuring up to 4.1 cm consistent with small bowel obstruction. Transition point visualized within the central pelvis slightly to the right of midline. Normal appendix. Sigmoid colon diverticular disease with some wall thickening present. Perforated viscus as evidence by multiple extraluminal gas collections within the lower abdominal and pelvic mesentery. Possible source at the distal sigmoid colon, series 2, image number 7 and coronal series 5, image number 72. Mildly thickened bowel loop with gas adjacent to it, series 2, image number 64. Vascular/Lymphatic: Mild aortic atherosclerosis. No aneurysmal dilatation. Mild retroperitoneal lymph nodes. Reproductive: Uterus and bilateral adnexa are unremarkable. Other: Small amount of free fluid in the pelvis. Right pelvic gas and fluid collection measuring 7.8 x 4.1 cm, appears contiguous on coronal views with gas and fluid adjacent to the sigmoid colon. Musculoskeletal: No acute or significant osseous findings. IMPRESSION: 1. Multiple gas locules within the lower abdominal and pelvic mesentery consistent with hollow viscus perforation. Suspected  source is distal sigmoid colon diverticulitis where there is gas and fluid that appears contiguous with a gas and fluid collection in the right pelvis measuring  7.8 x 4.1 cm, suspect for abscess. 2. Multiple loops of fluid-filled dilated small bowel consistent with mechanical bowel obstruction; transition point is in the central pelvis slightly to the right of midline, adjacent to the gas and fluid collection in the right pelvis. Critical Value/emergent results were called by telephone at the time of interpretation on 05/05/2017 at 4:14 pm to Dr. Linwood DibblesJON KNAPP , who verbally acknowledged these results. Electronically Signed   By: Jasmine PangKim  Fujinaga M.D.   On: 05/05/2017 16:14   Ct Image Guided Drainage By Percutaneous Catheter  Result Date: 05/06/2017 INDICATION: 57 year old female with pelvic abscess, likely secondary to diverticulitis EXAM: CT GUIDED DRAINAGE OF  ABSCESS MEDICATIONS: The patient is currently admitted to the hospital and receiving intravenous antibiotics. The antibiotics were administered within an appropriate time frame prior to the initiation of the procedure. ANESTHESIA/SEDATION: 2.5 mg IV Versed 150 mcg IV Fentanyl Moderate Sedation Time:  19 minutes The patient was continuously monitored during the procedure by the interventional radiology nurse under my direct supervision. COMPLICATIONS: None TECHNIQUE: Informed written consent was obtained from the patient after a thorough discussion of the procedural risks, benefits and alternatives. All questions were addressed. Maximal Sterile Barrier Technique was utilized including caps, mask, sterile gowns, sterile gloves, sterile drape, hand hygiene and skin antiseptic. A timeout was performed prior to the initiation of the procedure. PROCEDURE: The operative field was prepped with Chlorhexidine in a sterile fashion, and a sterile drape was applied covering the operative field. A sterile gown and sterile gloves were used for the procedure. Local anesthesia was provided with 1% Lidocaine. Patient is position prone position on the CT gantry table. Scout CT was acquired for planning purposes. The patient is prepped  and draped in the usual sterile fashion. The skin and subcutaneous tissues were generously infiltrated 1% lidocaine for local anesthesia. Using modified Seldinger technique, a 12 French pigtail drainage catheter was placed via a right transgluteal approach into the abscess cavity. Proximally 60 cc of purulent material was aspirated. Pigtail drainage catheter attached to bulb suction. Patient tolerated the procedure well and remained hemodynamically stable throughout. No complications were encountered and no significant blood loss. FINDINGS: Scout CT demonstrates gas and fluid collection within the right aspect of the pelvis. Final CT image demonstrates placement of 12 French drain into the fluid collection with relatively complete decompression. Approximately 60 cc of purulent material aspirated. IMPRESSION: Status post CT-guided drainage of pelvic abscess, right trans gluteal approach. Signed, Yvone NeuJaime S. Loreta AveWagner, DO [facility] Electronically Signed   By: Gilmer MorJaime  Wagner D.O.   On: 05/06/2017 13:57    Labs:  CBC: Recent Labs    05/05/17 1328 05/06/17 0539 05/07/17 0452  WBC 20.2* 6.1 9.9  HGB 11.9* 9.0* 9.9*  HCT 35.2* 26.0* 30.1*  PLT 431* 360 338    COAGS: Recent Labs    05/06/17 0539  INR 1.04  APTT 29    BMP: Recent Labs    05/05/17 1328 05/06/17 0539 05/07/17 0452  NA 131* 136 136  K 3.3* 3.1* 3.4*  CL 91* 104 102  CO2 25 24 23   GLUCOSE 137* 118* 125*  BUN 29* 22* 16  CALCIUM 9.0 7.9* 8.2*  CREATININE 1.84* 1.11* 0.86  GFRNONAA 29* 54* >60  GFRAA 34* >60 >60    LIVER FUNCTION TESTS: Recent  Labs    05/05/17 1328 05/06/17 0539 05/07/17 0452  BILITOT 3.7* 2.3* 1.0  AST 27 12* 17  ALT 25 18 18   ALKPHOS 186* 131* 125  PROT 7.8 5.9* 6.5  ALBUMIN 3.1* 2.3* 2.3*    Assessment and Plan: S/p drainage of pelvic abscess 3/26; afebrile; WBC /creat nl;hgb 9.9, drain fluid cx pend; cont current tx- output  monitoring/ drain flushes; check f/u CT once output less than 10 cc /24 hrs x 2 days   Electronically Signed: D. Jeananne Rama, PA-C 05/07/2017, 10:49 AM   I spent a total of 15 minutes at the the patient's bedside AND on the patient's hospital floor or unit, greater than 50% of which was counseling/coordinating care for pelvic abscess drain    Patient ID: Kim Snow, female   DOB: Mar 24, 1960, 57 y.o.   MRN: [id number]

## 2017-05-07 NOTE — Progress Notes (Addendum)
[facility] Progress Note     Subjective: CC:  Abdominal pain improving. Reports a dizzy spell while in bed this morning resulting in bilious emesis and diaphoresis of the back of her head/neck. Denies associated chest pain, SOB, palpitations, or worsening abdominal distention. Reports having minimal liquids last night/this morning - one apple juice, half of a popsicle, a few sips of water. +flatus. Has had 3 BMs - first one liquid, and now just loose.   Objective: Vital signs in last 24 hours: Temp:  [97.7 F (36.5 C)-99.9 F (37.7 C)] 98.5 F (36.9 C) (03/27 0700) Pulse Rate:  [96-110] 96 (03/27 0700) Resp:  [10-23] 18 (03/27 0700) BP: (106-129)/(63-79) 129/71 (03/27 0700) SpO2:  [94 %-100 %] 99 % (03/27 0700) Last BM Date: 05/03/17  Intake/Output from previous day: 03/26 0701 - 03/27 0700 In: 1810 [P.O.:60; I.V.:1600; IV Piggyback:150] Out: 790 [Urine:700; Drains:90] Intake/Output this shift: No intake/output data recorded.  PE: Gen:  Alert, NAD, pleasant Card:  Regular rate and rhythm, pedal pulses 2+ BL Pulm:  Normal effort, clear to auscultation bilaterally Abd: Soft, some distention, hypoactive BS, mild TTP lower abdomen/LLQ - improved compared to yesterday  drain: 90cc/24h, purulent  Skin: warm and dry, no rashes  Psych: A&Ox3   Lab Results:  Recent Labs    05/06/17 0539 05/07/17 0452  WBC 6.1 9.9  HGB 9.0* 9.9*  HCT 26.0* 30.1*  PLT 360 338   BMET Recent Labs    05/06/17 0539 05/07/17 0452  NA 136 136  K 3.1* 3.4*  CL 104 102  CO2 24 23  GLUCOSE 118* 125*  BUN 22* 16  CREATININE 1.11* 0.86  CALCIUM 7.9* 8.2*   PT/INR Recent Labs    05/06/17 0539  LABPROT 13.5  INR 1.04   CMP     Component Value Date/Time   NA 136 05/07/2017 0452   K 3.4 (L) 05/07/2017 0452   CL 102 05/07/2017 0452   CO2 23 05/07/2017 0452   GLUCOSE 125 (H) 05/07/2017 0452   BUN 16 05/07/2017 0452   CREATININE 0.86 05/07/2017 0452   CALCIUM 8.2 (L)  05/07/2017 0452   PROT 6.5 05/07/2017 0452   ALBUMIN 2.3 (L) 05/07/2017 0452   AST 17 05/07/2017 0452   ALT 18 05/07/2017 0452   ALKPHOS 125 05/07/2017 0452   BILITOT 1.0 05/07/2017 0452   GFRNONAA >60 05/07/2017 0452   GFRAA >60 05/07/2017 0452   Lipase  No results found for: LIPASE     Studies/Results: Ct Abdomen Pelvis Wo Contrast  Result Date: 05/05/2017 CLINICAL DATA:  Diffuse abdominal pain for 4 days with nausea and diarrhea EXAM: CT ABDOMEN AND PELVIS WITHOUT CONTRAST TECHNIQUE: Multidetector CT imaging of the abdomen and pelvis was performed following the standard protocol without IV contrast. COMPARISON:  Radiograph 05/05/2017 FINDINGS: Lower chest: Lung bases demonstrate no consolidation or pleural effusion. The heart size is within normal limits. Small hiatal hernia Hepatobiliary: No focal liver abnormality is seen. Status post cholecystectomy. No biliary dilatation. Pancreas: Unremarkable. No pancreatic ductal dilatation or surrounding inflammatory changes. Spleen: Normal in size without focal abnormality. Adrenals/Urinary Tract: Adrenal glands are unremarkable. Kidneys are normal, without renal calculi, focal lesion, or hydronephrosis. Bladder is unremarkable. Stomach/Bowel: Multiple loops of dilated fluid-filled small bowel measuring up to 4.1 cm consistent with small bowel obstruction. Transition point visualized within the central pelvis slightly to the right of midline. Normal appendix. Sigmoid colon diverticular disease with some wall thickening present. Perforated viscus as evidence by multiple extraluminal  gas collections within the lower abdominal and pelvic mesentery. Possible source at the distal sigmoid colon, series 2, image number 7 and coronal series 5, image number 72. Mildly thickened bowel loop with gas adjacent to it, series 2, image number 64. Vascular/Lymphatic: Mild aortic atherosclerosis. No aneurysmal dilatation. Mild retroperitoneal lymph nodes. Reproductive:  Uterus and bilateral adnexa are unremarkable. Other: Small amount of free fluid in the pelvis. Right pelvic gas and fluid collection measuring 7.8 x 4.1 cm, appears contiguous on coronal views with gas and fluid adjacent to the sigmoid colon. Musculoskeletal: No acute or significant osseous findings. IMPRESSION: 1. Multiple gas locules within the lower abdominal and pelvic mesentery consistent with hollow viscus perforation. Suspected source is distal sigmoid colon diverticulitis where there is gas and fluid that appears contiguous with a gas and fluid collection in the right pelvis measuring 7.8 x 4.1 cm, suspect for abscess. 2. Multiple loops of fluid-filled dilated small bowel consistent with mechanical bowel obstruction; transition point is in the central pelvis slightly to the right of midline, adjacent to the gas and fluid collection in the right pelvis. Critical Value/emergent results were called by telephone at the time of interpretation on 05/05/2017 at 4:14 pm to Dr. Linwood Dibbles , who verbally acknowledged these results. Electronically Signed   By: Jasmine Pang M.D.   On: 05/05/2017 16:14   Ct Image Guided Drainage By Percutaneous Catheter  Result Date: 05/06/2017 INDICATION: 57 year old female with pelvic abscess, likely secondary to diverticulitis EXAM: CT GUIDED DRAINAGE OF  ABSCESS MEDICATIONS: The patient is currently admitted to the hospital and receiving intravenous antibiotics. The antibiotics were administered within an appropriate time frame prior to the initiation of the procedure. ANESTHESIA/SEDATION: 2.5 mg IV Versed 150 mcg IV Fentanyl Moderate Sedation Time:  19 minutes The patient was continuously monitored during the procedure by the interventional radiology nurse under my direct supervision. COMPLICATIONS: None TECHNIQUE: Informed written consent was obtained from the patient after a thorough discussion of the procedural risks, benefits and alternatives. All questions were addressed.  Maximal Sterile Barrier Technique was utilized including caps, mask, sterile gowns, sterile gloves, sterile drape, hand hygiene and skin antiseptic. A timeout was performed prior to the initiation of the procedure. PROCEDURE: The operative field was prepped with Chlorhexidine in a sterile fashion, and a sterile drape was applied covering the operative field. A sterile gown and sterile gloves were used for the procedure. Local anesthesia was provided with 1% Lidocaine. Patient is position prone position on the CT gantry table. Scout CT was acquired for planning purposes. The patient is prepped and draped in the usual sterile fashion. The skin and subcutaneous tissues were generously infiltrated 1% lidocaine for local anesthesia. Using modified Seldinger technique, a 12 French pigtail drainage catheter was placed via a right transgluteal approach into the abscess cavity. Proximally 60 cc of purulent material was aspirated. Pigtail drainage catheter attached to bulb suction. Patient tolerated the procedure well and remained hemodynamically stable throughout. No complications were encountered and no significant blood loss. FINDINGS: Scout CT demonstrates gas and fluid collection within the right aspect of the pelvis. Final CT image demonstrates placement of 12 French drain into the fluid collection with relatively complete decompression. Approximately 60 cc of purulent material aspirated. IMPRESSION: Status post CT-guided drainage of pelvic abscess, right trans gluteal approach. Signed, Yvone Neu. Loreta Ave, DO [facility] Electronically Signed   By: Gilmer Mor D.O.   On: 05/06/2017 13:57    Anti-infectives: Anti-infectives (From admission,  onward)   Start     Dose/Rate Route Frequency Ordered Stop   05/05/17 2000  piperacillin-tazobactam (ZOSYN) IVPB 3.375 g     3.375 g 12.5 mL/hr over 240 Minutes Intravenous Every 8 hours 05/05/17 1817     05/05/17 1400   piperacillin-tazobactam (ZOSYN) IVPB 3.375 g     3.375 g 100 mL/hr over 30 Minutes Intravenous  Once 05/05/17 1351 05/05/17 1439       Assessment/Plan Perforated sigmoid diverticulitis with abscess - s/p 12 F transgluteal drain 3/26; GS with GNR, G+ cocci, G+ cocci, follow Cx - back off diet to sips/chips and monitor for worsening abdominal distention/further nausea or vomiting  - continue IV Zosyn  - mobilize/IS   AKI - resolved  hyperbilirubinemia - resolved HTN - PRN meds GERD - PPI  FEN: back off diet to sips/chips, IVF; hypokalemia (3.4), replace IV  ID: Zosyn 3/25 >> VTE: SCD's, Lovenox  Foley: none     LOS: 2 days    Adam PhenixElizabeth S Barrington Snow , [facility] 05/07/2017, 10:13 AM Pager: [phone] Consults: [phone] Mon-Fri 7:00 am-4:30 pm Sat-Sun 7:00 am-11:30 am

## 2017-05-08 LAB — BASIC METABOLIC PANEL
Anion gap: 8 (ref 5–15)
BUN: 9 mg/dL (ref 6–20)
CHLORIDE: 106 mmol/L (ref 101–111)
CO2: 24 mmol/L (ref 22–32)
Calcium: 8.2 mg/dL — ABNORMAL LOW (ref 8.9–10.3)
Creatinine, Ser: 0.57 mg/dL (ref 0.44–1.00)
GFR calc Af Amer: >60 mL/min (ref >60)
GFR calc non Af Amer: >60 mL/min (ref >60)
GLUCOSE: 104 mg/dL — AB (ref 65–99)
POTASSIUM: 3.6 mmol/L (ref 3.5–5.1)
Sodium: 138 mmol/L (ref 135–145)

## 2017-05-08 NOTE — Progress Notes (Signed)
Pharmacy Antibiotic Note  Kim Snow is a 57 y.o. female admitted on 05/05/2017 with intra-abdominal infection (diverticulitis of large intestines with abscess).  Pharmacy has been consulted for Zosyn dosing.   05/08/2017:  Tmax 99.1, WBC wnl, SCr wnl  D4 abx, adv to FL diet, possible po abx if tolerates diet  Plan: Zosyn 3.375g IV q8h (4 hour infusion).  Height: 5\' 5"  (165.1 cm) Weight: 160 lb (72.6 kg) IBW/kg (Calculated) : 57  Temp (24hrs), Avg:98.9 F (37.2 C), Min:98.7 F (37.1 C), Max:99.1 F (37.3 C)  Recent Labs  Lab 05/05/17 1328 05/05/17 1412 05/06/17 0539 05/07/17 0452 05/08/17 0435  WBC 20.2*  --  6.1 9.9  --   CREATININE 1.84*  --  1.11* 0.86 0.57  LATICACIDVEN  --  1.68  --   --   --     Estimated Creatinine Clearance: 77.4 mL/min (by C-G formula based on SCr of 0.57 mg/dL).    No Known Allergies  Antimicrobials this admission: 3/25 Zosyn >>   Microbiology results: 3/25 BCx: ngtd 3/25 UCx:  multiple sp-final 3/26 wound/abscess: cx few GNR  Thank you for allowing pharmacy to be a part of this patient's care.  Otho BellowsGreen, Bluford Sedler L PharmD Pager [phone] 05/08/2017, 11:14 AM

## 2017-05-08 NOTE — Progress Notes (Signed)
[facility] Progress Note     Subjective: CC:  Patient denies any further episodes of dizziness, nausea, vomiting.  States her abdominal pain is feeling well and her bloating is improving.  She is mobilizing more and was able to give herself sponge bath this morning.  Tolerated about 4 cups of juice, some water, and some ice chips yesterday without increase in pain or bloating.  Patient is eager for discharge.  States her drain output is becoming less thick.  Objective: Vital signs in last 24 hours: Temp:  [98.7 F (37.1 C)-99.1 F (37.3 C)] 98.7 F (37.1 C) (03/28 0503) Pulse Rate:  [82-94] 94 (03/28 0503) Resp:  [17] 17 (03/28 0503) BP: (114-126)/(71-79) 126/71 (03/28 0503) SpO2:  [98 %-99 %] 99 % (03/28 0503) Last BM Date: 05/07/17  Intake/Output from previous day: 03/27 0701 - 03/28 0700 In: 1350 [I.V.:1200; IV Piggyback:150] Out: 30 [Drains:30] Intake/Output this shift: No intake/output data recorded.  PE: Gen:  Alert, NAD, pleasant Card:  Regular rate and rhythm Pulm:  Normal effort Abd: Soft, mild tenderness to palpation of right lower quadrant, distention improving, bowel sounds present, no peritonitis Skin: warm and dry, no rashes  Psych: A&Ox3   Lab Results:  Recent Labs    05/06/17 0539 05/07/17 0452  WBC 6.1 9.9  HGB 9.0* 9.9*  HCT 26.0* 30.1*  PLT 360 338   BMET Recent Labs    05/07/17 0452 05/08/17 0435  NA 136 138  K 3.4* 3.6  CL 102 106  CO2 23 24  GLUCOSE 125* 104*  BUN 16 9  CREATININE 0.86 0.57  CALCIUM 8.2* 8.2*   PT/INR Recent Labs    05/06/17 0539  LABPROT 13.5  INR 1.04   CMP     Component Value Date/Time   NA 138 05/08/2017 0435   K 3.6 05/08/2017 0435   CL 106 05/08/2017 0435   CO2 24 05/08/2017 0435   GLUCOSE 104 (H) 05/08/2017 0435   BUN 9 05/08/2017 0435   CREATININE 0.57 05/08/2017 0435   CALCIUM 8.2 (L) 05/08/2017 0435   PROT 6.5 05/07/2017 0452   ALBUMIN 2.3 (L) 05/07/2017 0452   AST 17  05/07/2017 0452   ALT 18 05/07/2017 0452   ALKPHOS 125 05/07/2017 0452   BILITOT 1.0 05/07/2017 0452   GFRNONAA >60 05/08/2017 0435   GFRAA >60 05/08/2017 0435   Lipase  No results found for: LIPASE     Studies/Results: Ct Image Guided Drainage By Percutaneous Catheter  Result Date: 05/06/2017 INDICATION: 57 year old female with pelvic abscess, likely secondary to diverticulitis EXAM: CT GUIDED DRAINAGE OF  ABSCESS MEDICATIONS: The patient is currently admitted to the hospital and receiving intravenous antibiotics. The antibiotics were administered within an appropriate time frame prior to the initiation of the procedure. ANESTHESIA/SEDATION: 2.5 mg IV Versed 150 mcg IV Fentanyl Moderate Sedation Time:  19 minutes The patient was continuously monitored during the procedure by the interventional radiology nurse under my direct supervision. COMPLICATIONS: None TECHNIQUE: Informed written consent was obtained from the patient after a thorough discussion of the procedural risks, benefits and alternatives. All questions were addressed. Maximal Sterile Barrier Technique was utilized including caps, mask, sterile gowns, sterile gloves, sterile drape, hand hygiene and skin antiseptic. A timeout was performed prior to the initiation of the procedure. PROCEDURE: The operative field was prepped with Chlorhexidine in a sterile fashion, and a sterile drape was applied covering the operative field. A sterile gown and sterile gloves were used for the procedure. Local  anesthesia was provided with 1% Lidocaine. Patient is position prone position on the CT gantry table. Scout CT was acquired for planning purposes. The patient is prepped and draped in the usual sterile fashion. The skin and subcutaneous tissues were generously infiltrated 1% lidocaine for local anesthesia. Using modified Seldinger technique, a 12 French pigtail drainage catheter was placed via a right transgluteal approach into the abscess cavity.  Proximally 60 cc of purulent material was aspirated. Pigtail drainage catheter attached to bulb suction. Patient tolerated the procedure well and remained hemodynamically stable throughout. No complications were encountered and no significant blood loss. FINDINGS: Scout CT demonstrates gas and fluid collection within the right aspect of the pelvis. Final CT image demonstrates placement of 12 French drain into the fluid collection with relatively complete decompression. Approximately 60 cc of purulent material aspirated. IMPRESSION: Status post CT-guided drainage of pelvic abscess, right trans gluteal approach. Signed, Yvone NeuJaime S. Loreta AveWagner, DO [facility] Electronically Signed   By: Gilmer MorJaime  Wagner D.O.   On: 05/06/2017 13:57    Anti-infectives: Anti-infectives (From admission, onward)   Start     Dose/Rate Route Frequency Ordered Stop   05/05/17 2000  piperacillin-tazobactam (ZOSYN) IVPB 3.375 g     3.375 g 12.5 mL/hr over 240 Minutes Intravenous Every 8 hours 05/05/17 1817     05/05/17 1400  piperacillin-tazobactam (ZOSYN) IVPB 3.375 g     3.375 g 100 mL/hr over 30 Minutes Intravenous  Once 05/05/17 1351 05/05/17 1439     Assessment/Plan Perforated sigmoid diverticulitis with abscess - s/p 12 F transgluteal drain 3/26; GS with GNR, G+ cocci, G+ cocci, follow Cx - Nausea and vomiting resolved.  Having flatus and bowel movements.  Abdominal pain improving. - Advance diet to full liquids  - continue IV Zosyn  - mobilize/IS    AKI - resolved  hyperbilirubinemia- resolved HTN- PRN meds GERD - PPI  FEN: back off diet to sips/chips, IVF; hypokalemia (3.4), replace IV  ID: Zosyn 3/25 >> VTE: SCD's, Lovenox  Foley: none  Dispo - consult to case management for home health for drain care.  If patient does well with full liquid diet, we may be able to advance her diet and switch to oral antibiotics.  Anticipate discharge in the next 24-48  hours.   Hosie SpangleElizabeth Babs Dabbs, PA-C [facility] Pager: [phone] Consults: [phone] Mon-Fri 7:00 am-4:30 pm Sat-Sun 7:00 am-11:30 am

## 2017-05-08 NOTE — Progress Notes (Signed)
[facility]. Patient states she feels comfortable managing the drain herself, has already had some education. RN aware and will continue teaching. [phone]

## 2017-05-09 MED ORDER — ENSURE ENLIVE PO LIQD
237.0000 mL | Freq: Two times a day (BID) | ORAL | Status: DC
  Administered 2017-05-10 – 2017-05-11 (×3): 237 mL via ORAL

## 2017-05-09 NOTE — Progress Notes (Signed)
[facility] Progress Note      Assessment/Plan Perforated sigmoid diverticulitis with abscess -s/p 12 F transgluteal drain 3/26; culture showed E coli -  Abdominal pain unchanged from yesterday - Continue diet of full liquids  -continueIV Zosyn  - mobilize/IS    AKI -resolved hyperbilirubinemia-resolved HTN- PRN meds GERD - PPI  ZOX:WRUEFEN:Full liquids ID: Zosyn 3/25 >> VTE: SCD's,Lovenox Foley: none  Dispo - because pain is unchanged and not improved from yesterday we will continue full liquid diet. Anticipate discharge in the next 24-48 hours.  2 weeks total of antibiotics at time of discharge.    LOS: 4 days    Subjective: CC: Abdominal pain  Patient states her pain is unchanged from yesterday.  She thinks she may have overdone it on the diet yesterday.  She is having discomfort with movement.  No nausea or vomiting, fever or chills.  She is tolerating a full liquid diet.  Patient is very anxious to go home  Objective: Vital signs in last 24 hours: Temp:  [98.3 F (36.8 C)-98.7 F (37.1 C)] 98.7 F (37.1 C) (03/29 0601) Pulse Rate:  [79-82] 82 (03/29 0601) Resp:  [16] 16 (03/29 0601) BP: (119-127)/(69-80) 120/80 (03/29 0601) SpO2:  [98 %-100 %] 98 % (03/29 0601) Last BM Date: 05/08/17  Intake/Output from previous day: 03/28 0701 - 03/29 0700 In: 2605 [P.O.:600; I.V.:1905; IV Piggyback:100] Out: 40 [Drains:40] Intake/Output this shift: No intake/output data recorded.  PE: Gen:  Alert, NAD, pleasant, cooperative, appears uncomfortable with positioning in the bed Card:  RRR, no M/G/R heard Pulm:  CTA, no W/R/R, effort normal Abd: Soft, nondistended, good bowel sounds, TTP in right lower quadrant and periumbilical regions, mild guarding, no signs of peritonitis, drain with minimal cloudy yellow drainage Skin: no rashes noted, warm and dry   Anti-infectives: Anti-infectives (From admission, onward)   Start     Dose/Rate Route  Frequency Ordered Stop   05/05/17 2000  piperacillin-tazobactam (ZOSYN) IVPB 3.375 g     3.375 g 12.5 mL/hr over 240 Minutes Intravenous Every 8 hours 05/05/17 1817     05/05/17 1400  piperacillin-tazobactam (ZOSYN) IVPB 3.375 g     3.375 g 100 mL/hr over 30 Minutes Intravenous  Once 05/05/17 1351 05/05/17 1439      Lab Results:  Recent Labs    05/07/17 0452  WBC 9.9  HGB 9.9*  HCT 30.1*  PLT 338   BMET Recent Labs    05/07/17 0452 05/08/17 0435  NA 136 138  K 3.4* 3.6  CL 102 106  CO2 23 24  GLUCOSE 125* 104*  BUN 16 9  CREATININE 0.86 0.57  CALCIUM 8.2* 8.2*   PT/INR No results for input(s): LABPROT, INR in the last 72 hours. CMP     Component Value Date/Time   NA 138 05/08/2017 0435   K 3.6 05/08/2017 0435   CL 106 05/08/2017 0435   CO2 24 05/08/2017 0435   GLUCOSE 104 (H) 05/08/2017 0435   BUN 9 05/08/2017 0435   CREATININE 0.57 05/08/2017 0435   CALCIUM 8.2 (L) 05/08/2017 0435   PROT 6.5 05/07/2017 0452   ALBUMIN 2.3 (L) 05/07/2017 0452   AST 17 05/07/2017 0452   ALT 18 05/07/2017 0452   ALKPHOS 125 05/07/2017 0452   BILITOT 1.0 05/07/2017 0452   GFRNONAA >60 05/08/2017 0435   GFRAA >60 05/08/2017 0435   Lipase  No results found for: LIPASE  Studies/Results: No results found.    Jerre SimonJessica L Focht , PA-C  [facility] 05/09/2017, 9:09 AM  Pager: [phone] Mon-Wed, Friday 7:00am-4:30pm Thurs 7am-11:30am  Consults: [phone]

## 2017-05-09 NOTE — Progress Notes (Signed)
Drain tube to buttocks flushed with 5 ml of ns, yellow thin drainage with sediments noted, patient tolerated well

## 2017-05-10 LAB — CULTURE, BLOOD (ROUTINE X 2)
Culture: NO GROWTH
Culture: NO GROWTH
SPECIAL REQUESTS: ADEQUATE
Special Requests: ADEQUATE

## 2017-05-10 LAB — AEROBIC/ANAEROBIC CULTURE W GRAM STAIN (SURGICAL/DEEP WOUND)

## 2017-05-10 LAB — AEROBIC/ANAEROBIC CULTURE (SURGICAL/DEEP WOUND)

## 2017-05-10 MED ORDER — DOCUSATE SODIUM 100 MG PO CAPS
100.0000 mg | ORAL_CAPSULE | Freq: Two times a day (BID) | ORAL | Status: DC
  Administered 2017-05-10 – 2017-05-11 (×3): 100 mg via ORAL
  Filled 2017-05-10 (×3): qty 1

## 2017-05-10 MED ORDER — MORPHINE SULFATE (PF) 2 MG/ML IV SOLN: 1.0000 mg | INTRAVENOUS | Status: DC | PRN

## 2017-05-10 MED ORDER — OXYCODONE HCL 5 MG PO TABS
5.0000 mg | ORAL_TABLET | ORAL | Status: DC | PRN
Start: 2017-05-10 — End: 2017-05-11
  Administered 2017-05-10 (×2): 5 mg via ORAL
  Filled 2017-05-10 (×2): qty 1

## 2017-05-10 NOTE — Progress Notes (Signed)
Pt stated she was feeling swelling on the bottom left side of her lip after eating breakfast. She is not sure if the pineapples or [employer] seasoning caused the reaction. I consulted with Dr. Fredricka Bonineonnor and pt was given diphenhydramine at 1020.

## 2017-05-10 NOTE — Progress Notes (Signed)
[facility] Progress Note      Assessment/Plan Perforated sigmoid diverticulitis with abscess -s/p 12 F transgluteal drain 3/26; culture showed E coli -  Abdominal pain improving a little bit - Will advance to soft diet -continueIV Zosyn  - mobilize/IS    AKI -resolved hyperbilirubinemia-resolved HTN- PRN meds GERD - PPI  ZOX:WRUEAVWFEN:Advance to soft diet ID: Zosyn 3/25 >> VTE: SCD's,Lovenox Foley: none  Dispo -pain has improved a bit so we will try advancing the diet.  Recheck her labs tomorrow.  If she tolerates diet, pain is minimal and she is able to have bowel movements, as well as normal labs, will plan discharge tomorrow.  2 weeks total of antibiotics at time of discharge.    LOS: 5 days    Subjective: CC: Abdominal pain  She states that her pain is improved.  She is now able to lay on her side.  She tolerated liquids yesterday.  She is very anxious to go home.  Objective: Vital signs in last 24 hours: Temp:  [98.6 F (37 C)-99.5 F (37.5 C)] 99.4 F (37.4 C) (03/30 0631) Pulse Rate:  [81-90] 87 (03/30 0631) BP: (126-143)/(67-94) 143/94 (03/30 0631) SpO2:  [99 %-100 %] 99 % (03/30 0631) Last BM Date: 05/09/17  Intake/Output from previous day: 03/29 0701 - 03/30 0700 In: 2173.3 [I.V.:1963.3; IV Piggyback:200] Out: 40 [Drains:40] Intake/Output this shift: No intake/output data recorded.  PE: Gen:  Alert, NAD, pleasant, cooperative Card:  RRR, no M/G/R heard Pulm:  CTA, no W/R/R, effort normal Abd: Soft, mildly distended, mildly tender in lower fields. drain with minimal cloudy yellow drainage Skin: no rashes noted, warm and dry   Anti-infectives: Anti-infectives (From admission, onward)   Start     Dose/Rate Route Frequency Ordered Stop   05/05/17 2000  piperacillin-tazobactam (ZOSYN) IVPB 3.375 g     3.375 g 12.5 mL/hr over 240 Minutes Intravenous Every 8 hours 05/05/17 1817     05/05/17 1400  piperacillin-tazobactam  (ZOSYN) IVPB 3.375 g     3.375 g 100 mL/hr over 30 Minutes Intravenous  Once 05/05/17 1351 05/05/17 1439      Lab Results:  No results for input(s): WBC, HGB, HCT, PLT in the last 72 hours. BMET Recent Labs    05/08/17 0435  NA 138  K 3.6  CL 106  CO2 24  GLUCOSE 104*  BUN 9  CREATININE 0.57  CALCIUM 8.2*   PT/INR No results for input(s): LABPROT, INR in the last 72 hours. CMP     Component Value Date/Time   NA 138 05/08/2017 0435   K 3.6 05/08/2017 0435   CL 106 05/08/2017 0435   CO2 24 05/08/2017 0435   GLUCOSE 104 (H) 05/08/2017 0435   BUN 9 05/08/2017 0435   CREATININE 0.57 05/08/2017 0435   CALCIUM 8.2 (L) 05/08/2017 0435   PROT 6.5 05/07/2017 0452   ALBUMIN 2.3 (L) 05/07/2017 0452   AST 17 05/07/2017 0452   ALT 18 05/07/2017 0452   ALKPHOS 125 05/07/2017 0452   BILITOT 1.0 05/07/2017 0452   GFRNONAA >60 05/08/2017 0435   GFRAA >60 05/08/2017 0435   Lipase  No results found for: LIPASE  Studies/Results: No results found.    Berna Buehelsea A Connor , MD [facility] 05/10/2017, 8:10 AM

## 2017-05-10 NOTE — Progress Notes (Signed)
Pharmacy Antibiotic Note  Kim FinnChristine G Kim Snow is a 57 y.o. female admitted on 05/05/2017 with intra-abdominal infection (diverticulitis of large intestines with abscess).  Pharmacy has been consulted for Zosyn dosing.   05/10/2017:  Tmax 99.4, WBC wnl on 3/27, SCr wnl  D6 abx, adv to FL diet, possible po abx if tolerates diet  Plan noted for 14 days total antibiotics  Plan: Zosyn 3.375g IV q8h (4 hour infusion).  No further dose adjustments needed Pharmacy will sign off  Height: 5\' 5"  (165.1 cm) Weight: 160 lb (72.6 kg) IBW/kg (Calculated) : 57  Temp (24hrs), Avg:99.2 F (37.3 C), Min:98.6 F (37 C), Max:99.5 F (37.5 C)  Recent Labs  Lab 05/05/17 1328 05/05/17 1412 05/06/17 0539 05/07/17 0452 05/08/17 0435  WBC 20.2*  --  6.1 9.9  --   CREATININE 1.84*  --  1.11* 0.86 0.57  LATICACIDVEN  --  1.68  --   --   --     Estimated Creatinine Clearance: 77.4 mL/min (by C-G formula based on SCr of 0.57 mg/dL).    No Known Allergies  Antimicrobials this admission: 3/25 Zosyn >>   Microbiology results: 3/25 BCx: ngtd 3/25 UCx:  multiple sp-final 3/26 wound/abscess: cx few E.coli, holding for possible anaerobe  Thank you for allowing pharmacy to be a part of this patient's care.  Loralee PacasErin Shaima Sardinas, PharmD, BCPS Pager: [phone] 05/10/2017, 10:46 AM

## 2017-05-11 ENCOUNTER — Inpatient Hospital Stay (HOSPITAL_COMMUNITY): Payer: BC Managed Care – PPO

## 2017-05-11 ENCOUNTER — Encounter (HOSPITAL_COMMUNITY): Payer: Self-pay | Admitting: Radiology

## 2017-05-11 LAB — CBC
HCT: 29.8 % — ABNORMAL LOW (ref 36.0–46.0)
Hemoglobin: 10 g/dL — ABNORMAL LOW (ref 12.0–15.0)
MCH: 31.1 pg (ref 26.0–34.0)
MCHC: 33.6 g/dL (ref 30.0–36.0)
MCV: 92.5 fL (ref 78.0–100.0)
PLATELETS: 443 10*3/uL — AB (ref 150–400)
RBC: 3.22 MIL/uL — AB (ref 3.87–5.11)
RDW: 13.7 % (ref 11.5–15.5)
WBC: 16.3 10*3/uL — AB (ref 4.0–10.5)

## 2017-05-11 LAB — BASIC METABOLIC PANEL
ANION GAP: 10 (ref 5–15)
BUN: 7 mg/dL (ref 6–20)
CO2: 27 mmol/L (ref 22–32)
Calcium: 8.4 mg/dL — ABNORMAL LOW (ref 8.9–10.3)
Chloride: 98 mmol/L — ABNORMAL LOW (ref 101–111)
Creatinine, Ser: 0.7 mg/dL (ref 0.44–1.00)
GFR calc Af Amer: >60 mL/min (ref >60)
Glucose, Bld: 102 mg/dL — ABNORMAL HIGH (ref 65–99)
POTASSIUM: 3.6 mmol/L (ref 3.5–5.1)
Sodium: 135 mmol/L (ref 135–145)

## 2017-05-11 IMAGING — CT CT ABD-PELV W/ CM
2 of 4 series · 16 of 46 positions shown, 18 images · IV contrast (ISOVUE 300)
Comparison: 05/05/2017

CLINICAL DATA: Diverticular abscess, status post percutaneous drain
05/06/2017

EXAM:
CT ABDOMEN AND PELVIS WITH CONTRAST
TECHNIQUE: Multidetector CT imaging of the abdomen and pelvis was performed
using the standard protocol following bolus administration of
intravenous contrast.
CONTRAST:  100mL CWY7HI-GKK IOPAMIDOL (CWY7HI-GKK) INJECTION 61%

[Series 2: axial st · axial · 0.90mm/px · z∈[+1136,+1542]mm · 13 of 91 slices shown, 15 images]
[im 5/91  soft-tissue]
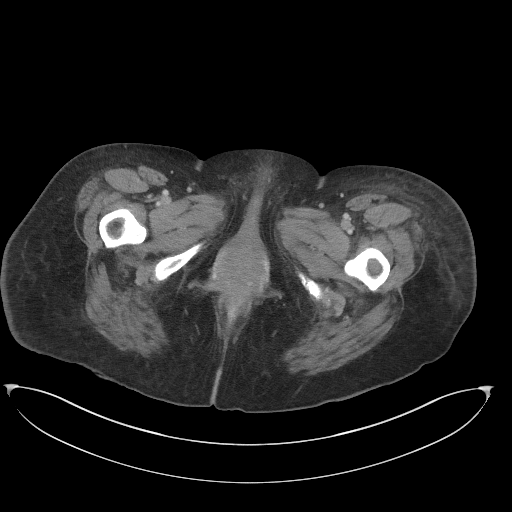
[im 5/91  bone]
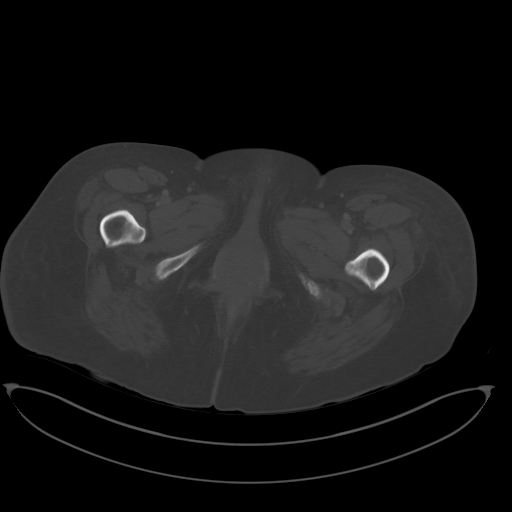
[im 13/91  soft-tissue]
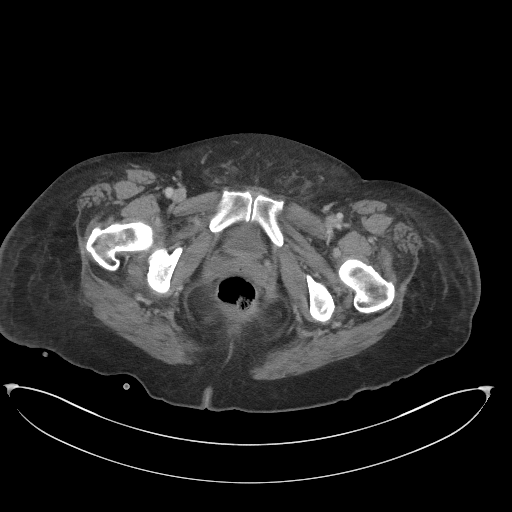
[im 18/91  soft-tissue]
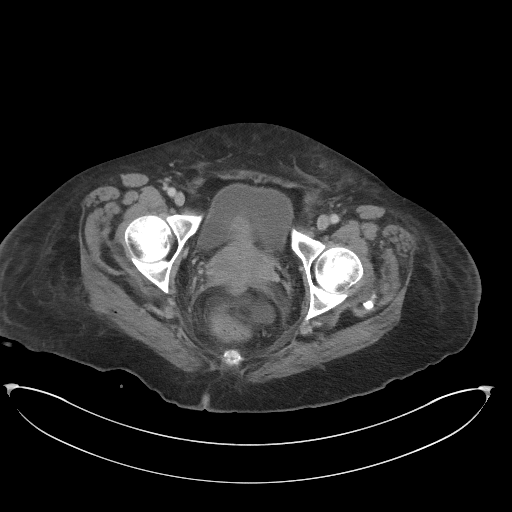
[im 26/91  soft-tissue]
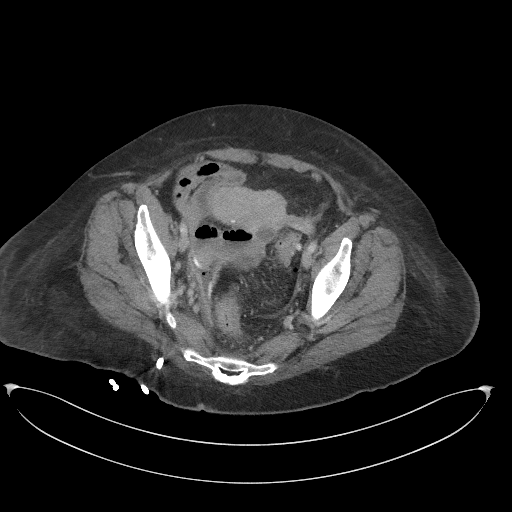
[im 31/91  soft-tissue]
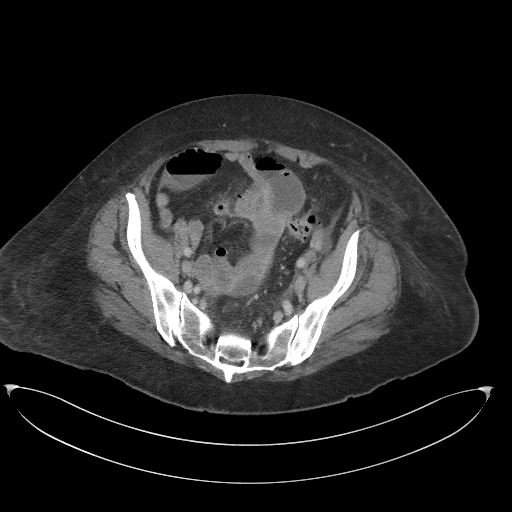
[im 39/91  soft-tissue]
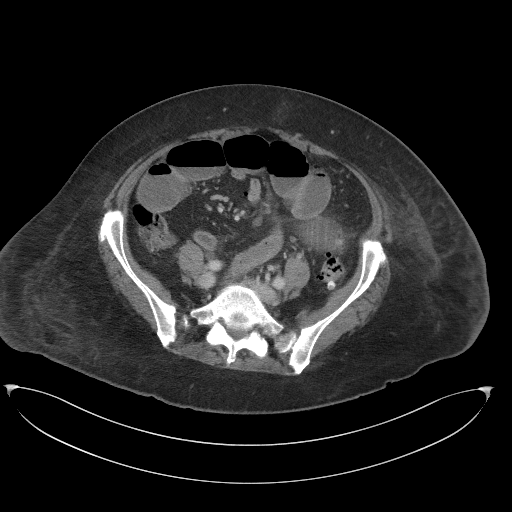
[im 48/91  soft-tissue]
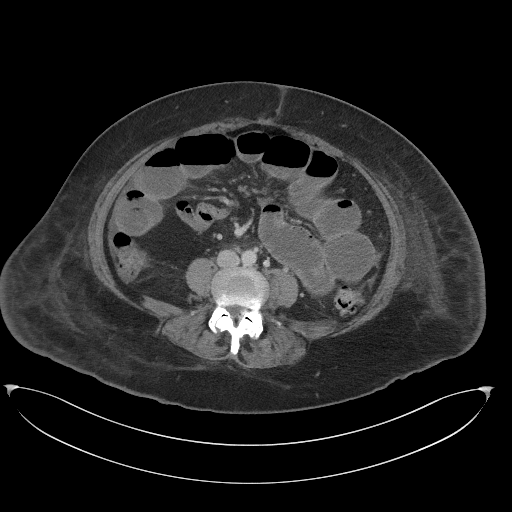
[im 52/91  soft-tissue]
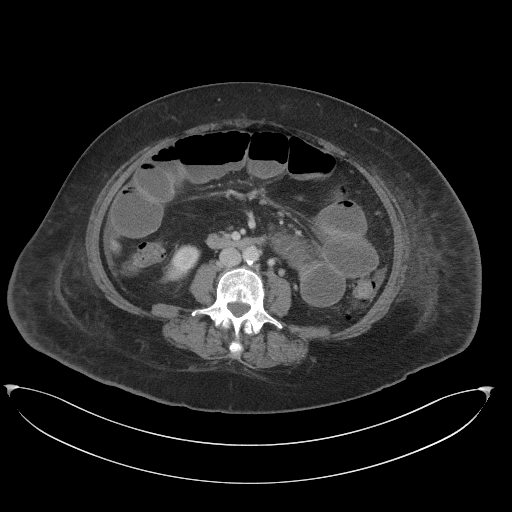
[im 61/91  soft-tissue]
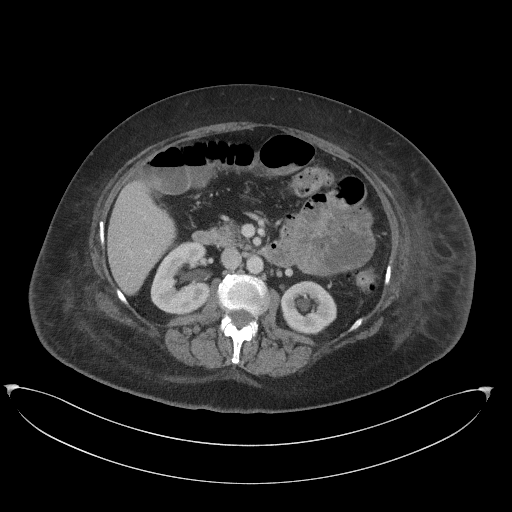
[im 61/91  bone]
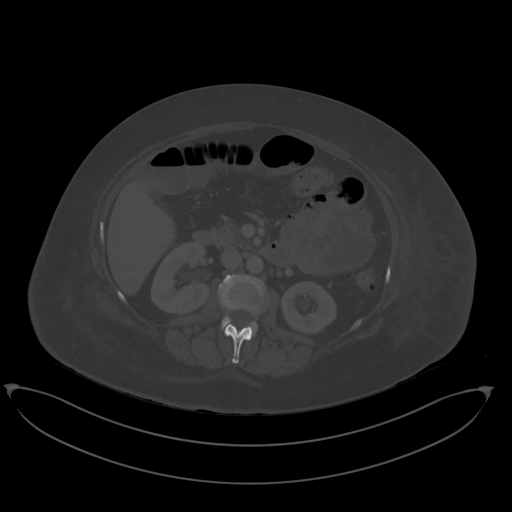
[im 65/91  soft-tissue]
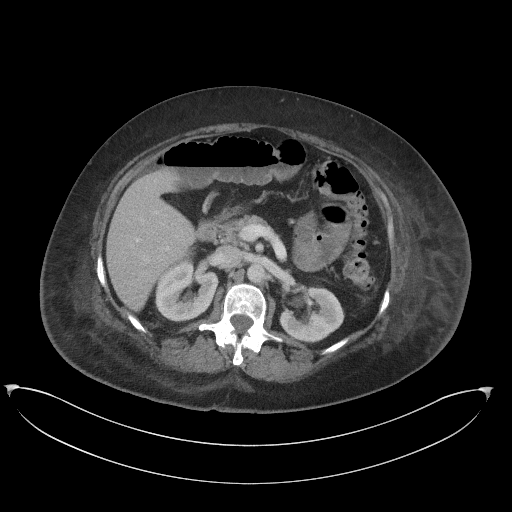
[im 73/91  soft-tissue]
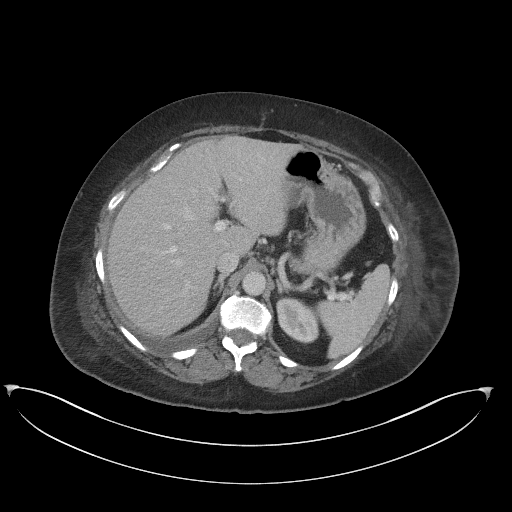
[im 78/91  soft-tissue]
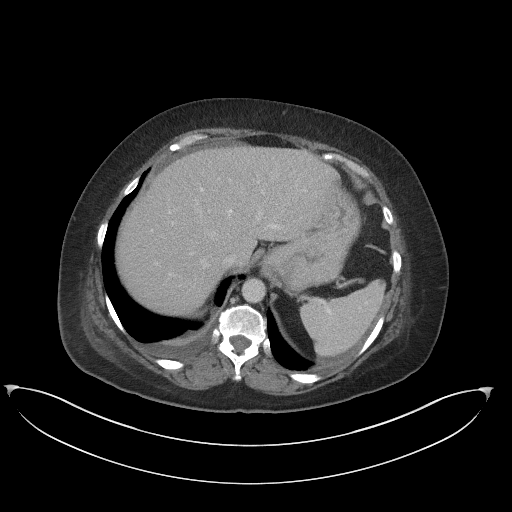
[im 86/91  soft-tissue]
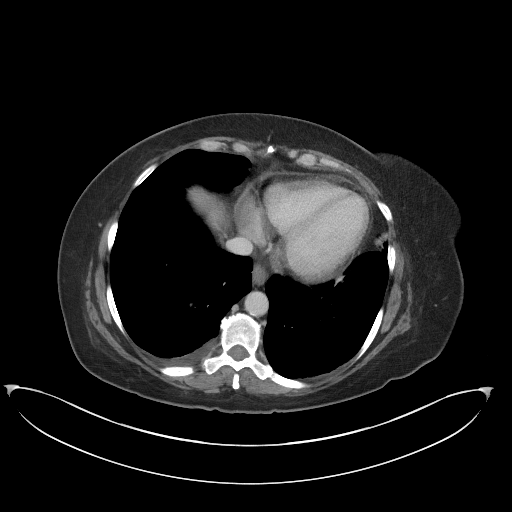

[Series 5: coronal st · coronal · 0.81mm/px · 3 of 109 slices shown]
[im 37/109  soft-tissue]
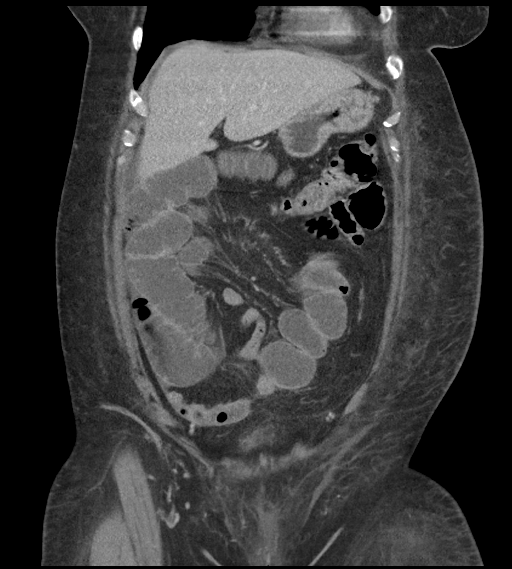
[im 49/109  soft-tissue]
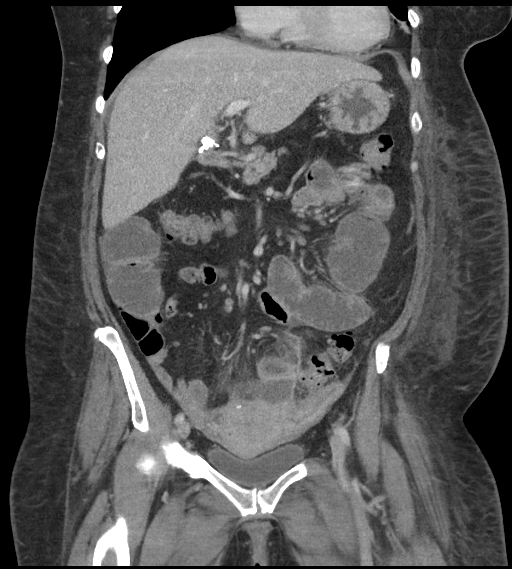
[im 61/109  soft-tissue]
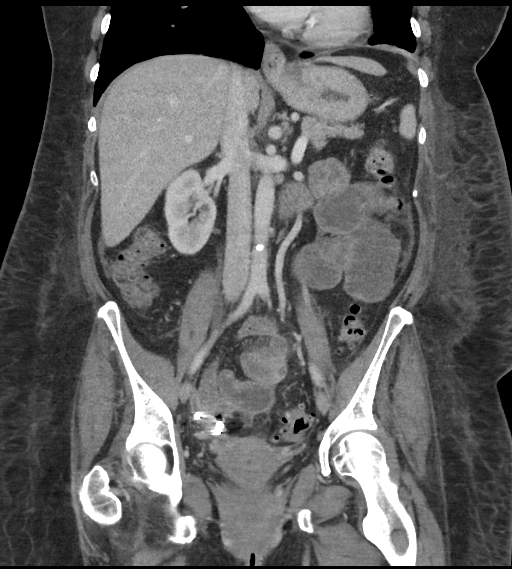

[16 of 46 positions shown; findings below may reference images not displayed]

FINDINGS: Lower chest: Normal heart size. No pericardial effusion. Trace
pleural effusions with minimal basilar atelectasis.

Hepatobiliary: No focal liver abnormality is seen. Status post
cholecystectomy. No biliary dilatation.

Pancreas: Unremarkable. No pancreatic ductal dilatation or
surrounding inflammatory changes.

Spleen: Normal in size without focal abnormality.

Adrenals/Urinary Tract: Adrenal glands are unremarkable. Kidneys are
normal, without renal calculi, focal lesion, or hydronephrosis.
Bladder is unremarkable.

Stomach/Bowel: There remain multiple dilated loops of fluid-filled
small bowel throughout the abdomen extending into the pelvis.
Suspicious for bowel obstruction versus ileus.

Right trans gluteal percutaneous drain has been inserted. Trace
amount of residual fluid at the drain catheter site. Overall
significant improvement in the right pelvic abscess since placement.
Inflammation and edema persist about the pelvis along the sigmoid
colon compatible with diverticulitis. No new abdominal or pelvic
fluid collections.

Vascular/Lymphatic: Aortic atherosclerosis. Negative for aneurysm.
No adenopathy. Mesenteric and renal vasculature appear patent. No
occlusive vascular process.

Reproductive: Uterus and adnexa normal in size.

Other: No inguinal or abdominal wall hernia.

Musculoskeletal: Body anasarca noted. Degenerative changes of the
spine and facet arthropathy. No acute osseous finding.
IMPRESSION: Significant improvement in the right deep pelvis diverticular
abscess status post percutaneous drain. Trace amount of fluid noted
at the drain catheter site compared to the prior study. Pelvic
sigmoid diverticulitis persist.

Persistent dilated small bowel throughout the abdomen and pelvis
terminating in the pelvis compatible with mechanical small-bowel
obstruction, less likely ileus.

## 2017-05-11 MED ORDER — PANTOPRAZOLE SODIUM 40 MG PO TBEC: 40.0000 mg | DELAYED_RELEASE_TABLET | Freq: Every day | ORAL | Status: DC

## 2017-05-11 MED ORDER — ACETAMINOPHEN 325 MG PO TABS: 650.0000 mg | ORAL_TABLET | Freq: Four times a day (QID) | ORAL | Status: AC | PRN

## 2017-05-11 MED ORDER — DOCUSATE SODIUM 100 MG PO CAPS: 100.0000 mg | ORAL_CAPSULE | Freq: Two times a day (BID) | ORAL | 0 refills | Status: DC

## 2017-05-11 MED ORDER — IOPAMIDOL (ISOVUE-300) INJECTION 61%
100.0000 mL | Freq: Once | INTRAVENOUS | Status: AC | PRN
  Administered 2017-05-11: 100 mL via INTRAVENOUS

## 2017-05-11 MED ORDER — OXYCODONE HCL 5 MG PO TABS: 5.0000 mg | ORAL_TABLET | Freq: Four times a day (QID) | ORAL | 0 refills | Status: DC | PRN

## 2017-05-11 MED ORDER — AMOXICILLIN-POT CLAVULANATE 875-125 MG PO TABS: 1.0000 | ORAL_TABLET | Freq: Two times a day (BID) | ORAL | 0 refills | Status: DC

## 2017-05-11 MED ORDER — IOPAMIDOL (ISOVUE-300) INJECTION 61%
INTRAVENOUS | Status: AC
  Filled 2017-05-11: qty 100

## 2017-05-11 NOTE — Progress Notes (Signed)
PHARMACIST - PHYSICIAN COMMUNICATION  DR:   Connor  CONCERNING: IV to Oral Route Change Policy  RECOMMENDATION: This patient is receiving Protonix and Benadryl by the intravenous route.  Based on criteria approved by the Pharmacy and Therapeutics Committee, the intravenous medication(s) is/are beiFredricka Bonineng converted to the equivalent oral dose form(s).   DESCRIPTION: These criteria include:  Diphenhydramine is not prescribed to treat or prevent a severe allergic reaction  Diphenhydramine is not prescribed as premedication prior to receiving blood product, biologic medication, antimicrobial, or chemotherapy agent  The patient has tolerated at least one dose of an oral or enteral medication  The patient has no evidence of active gastrointestinal bleeding or impaired GI absorption (gastrectomy, short bowel, patient on TNA or NPO).  The patient is not undergoing procedural sedation    DESCRIPTION: These criteria include:  The patient is eating (either orally or via tube) and/or has been taking other orally administered medications for a least 24 hours  The patient has no evidence of active gastrointestinal bleeding or impaired GI absorption (gastrectomy, short bowel, patient on TNA or NPO).  If you have questions about this conversion, please contact the Pharmacy Department  []   [phone]( [phone] )  [facility] []   [phone]( [phone] )  [facility] []   [phone]( [phone] )  [facility] []   [phone]  [facility] [x]   [phone]( [phone] )  [facility]   Loralee PacasErin Amayia Ciano, PharmD, BCPS 05/11/2017 10:44 AM

## 2017-05-11 NOTE — Progress Notes (Signed)
[facility] Progress Note      Assessment/Plan Perforated sigmoid diverticulitis with abscess -s/p 12 F transgluteal drain 3/26; culture showed E coli -  Abdominal pain continues to improve - Tolerating soft diet -continueIV Zosyn  - mobilize/IS    AKI -resolved hyperbilirubinemia-resolved HTN- PRN meds GERD - PPI  ZOX:WRUEFEN:soft diet ID: Remains afebrile but WBC increased. Zosyn 3/25 >> VTE: SCD's,Lovenox Foley: none  Dispo -Continue to improve symptomatically however her wbc has trended back up. Drain output has slowed. She is eager to be dc'd. Will repeat CT to ensure no undrained fluid collection. If that looks ok, will be more comfortable letting her go home later today. 2 weeks total of antibiotics at time of discharge. Will need follow up with IR and with Dr. Andrey CampanileWilson.     LOS: 6 days    Subjective: CC: Abdominal pain  Pain continues to improve. Tolerating PO but doesn't like the food.  She is very anxious to go home.  Objective: Vital signs in last 24 hours: Temp:  [98.5 F (36.9 C)-98.9 F (37.2 C)] 98.5 F (36.9 C) (03/31 0525) Pulse Rate:  [87-93] 93 (03/31 0525) Resp:  [16-17] 17 (03/31 0525) BP: (118-138)/(77-92) 138/78 (03/31 0525) SpO2:  [94 %-100 %] 98 % (03/31 0525) Last BM Date: 05/10/17  Intake/Output from previous day: 03/30 0701 - 03/31 0700 In: 642 [P.O.:477; IV Piggyback:150] Out: 19 [Drains:19] Intake/Output this shift: No intake/output data recorded.  PE: Gen:  Alert, NAD, pleasant, cooperative Card:  RRR, no M/G/R heard Pulm:  CTA, no W/R/R, effort normal Abd: Soft, mildly distended, minimally tender in lower fields. drain with minimal output, mostly flush Skin: no rashes noted, warm and dry   Anti-infectives: Anti-infectives (From admission, onward)   Start     Dose/Rate Route Frequency Ordered Stop   05/05/17 2000  piperacillin-tazobactam (ZOSYN) IVPB 3.375 g     3.375 g 12.5 mL/hr over 240  Minutes Intravenous Every 8 hours 05/05/17 1817     05/05/17 1400  piperacillin-tazobactam (ZOSYN) IVPB 3.375 g     3.375 g 100 mL/hr over 30 Minutes Intravenous  Once 05/05/17 1351 05/05/17 1439      Lab Results:  Recent Labs    05/11/17 0446  WBC 16.3*  HGB 10.0*  HCT 29.8*  PLT 443*   BMET Recent Labs    05/11/17 0446  NA 135  K 3.6  CL 98*  CO2 27  GLUCOSE 102*  BUN 7  CREATININE 0.70  CALCIUM 8.4*   PT/INR No results for input(s): LABPROT, INR in the last 72 hours. CMP     Component Value Date/Time   NA 135 05/11/2017 0446   K 3.6 05/11/2017 0446   CL 98 (L) 05/11/2017 0446   CO2 27 05/11/2017 0446   GLUCOSE 102 (H) 05/11/2017 0446   BUN 7 05/11/2017 0446   CREATININE 0.70 05/11/2017 0446   CALCIUM 8.4 (L) 05/11/2017 0446   PROT 6.5 05/07/2017 0452   ALBUMIN 2.3 (L) 05/07/2017 0452   AST 17 05/07/2017 0452   ALT 18 05/07/2017 0452   ALKPHOS 125 05/07/2017 0452   BILITOT 1.0 05/07/2017 0452   GFRNONAA >60 05/11/2017 0446   GFRAA >60 05/11/2017 0446   Lipase  No results found for: LIPASE  Studies/Results: No results found.    Berna Buehelsea A Keondra Haydu , MD [facility] 05/11/2017, 7:48 AM

## 2017-05-12 NOTE — Discharge Summary (Signed)
Physician Discharge Summary  Patient ID: Kim Snow MRN: [id number] DOB/AGE: 58-30-1962 58 y.o.  Admit date: 05/05/2017 Discharge date: 05/12/2017  Admission Diagnoses:  Discharge Diagnoses:  Active Problems:   Diverticulitis of colon with perforation   Diverticulitis of large intestine with perforation and abscess   Discharged Condition: good  Hospital Course: Perforated diverticulitis with abscess. Underwent percutaneous drain placement in IR and treatment with IV antibiotics.   Consults: IR  Discharge Exam: Blood pressure 118/72, pulse (!) 103, temperature 99.7 F (37.6 C), temperature source Oral, resp. rate 16, height 5\' 5"  (1.651 m), weight 72.6 kg (160 lb), SpO2 97 %. See progress note from day of discharge  Disposition:   Discharge Instructions    Diet - low sodium heart healthy   Complete by:  As directed    Increase activity slowly   Complete by:  As directed      Allergies as of 05/11/2017   No Known Allergies     Medication List    TAKE these medications   acetaminophen 325 MG tablet Commonly known as:  TYLENOL Take 2 tablets (650 mg total) by mouth every 6 (six) hours as needed for mild pain (or temp > 100).   ALPRAZolam 1 MG tablet Commonly known as:  XANAX Take 1 mg by mouth. .5 MG IN THE AM, .5 MG IN THE AFTERNOON AND .5 MG AT BEDTIME   amoxicillin-clavulanate 875-125 MG tablet Commonly known as:  AUGMENTIN Take 1 tablet by mouth 2 (two) times daily for 14 days.   CALCIUM PO Take 1,000 mg by mouth daily.   docusate sodium 100 MG capsule Commonly known as:  COLACE Take 1 capsule (100 mg total) by mouth 2 (two) times daily.   IFEREX 150 PO Take 150 mg by mouth daily.   losartan-hydrochlorothiazide 100-25 MG tablet Commonly known as:  HYZAAR Take 1 tablet by mouth daily.   meloxicam 15 MG tablet Commonly known as:  MOBIC Take 15 mg by mouth daily at 12 noon.   omeprazole 40 MG capsule Commonly known as:  PRILOSEC Take 40 mg  by mouth daily.   oxyCODONE 5 MG immediate release tablet Commonly known as:  Oxy IR/ROXICODONE Take 1 tablet (5 mg total) by mouth every 6 (six) hours as needed for severe pain or breakthrough pain.   PREMARIN vaginal cream Generic drug:  conjugated estrogens Place 1 Applicatorful vaginally Nightly.   VITAMIN B 12 PO Take 1 tablet by mouth daily.      Follow-up Information    Gaynelle AduWilson, Eric, MD. Schedule an appointment as soon as possible for a visit in 2 week(s).   Specialty:  General Surgery Contact information: [address] [place] [address] [phone]        [facility]. Call in 1 day(s).           Signed: Berna BueChelsea A Zaleigh Bermingham 05/12/2017, 10:33 AM

## 2017-05-14 ENCOUNTER — Other Ambulatory Visit: Payer: Self-pay | Admitting: Surgery

## 2017-05-14 DIAGNOSIS — N739 Female pelvic inflammatory disease, unspecified: Secondary | ICD-10-CM

## 2017-05-14 DIAGNOSIS — K630 Abscess of intestine: Secondary | ICD-10-CM

## 2017-05-20 ENCOUNTER — Ambulatory Visit
Admission: RE | Admit: 2017-05-20 | Discharge: 2017-05-20 | Disposition: A | Discharge status: Home / Self Care | Payer: BC Managed Care – PPO | Source: Ambulatory Visit | Attending: Surgery | Admitting: Surgery

## 2017-05-20 ENCOUNTER — Encounter: Payer: Self-pay | Admitting: Radiology

## 2017-05-20 DIAGNOSIS — N739 Female pelvic inflammatory disease, unspecified: Secondary | ICD-10-CM

## 2017-05-20 DIAGNOSIS — K630 Abscess of intestine: Secondary | ICD-10-CM

## 2017-05-20 HISTORY — PX: IR RADIOLOGIST EVAL & MGMT: IMG5224

## 2017-05-20 IMAGING — RF DG SINUS / FISTULA TRACT / ABSCESSOGRAM
2 series · 5 of 5 positions shown · non-contrast
Comparison: none

INDICATION: Status post percutaneous catheter drainage of right posterior pelvic
abscess on 05/06/2017.

[Series 1: sequence · 4 of 34 frames shown]
[frame 1/34]
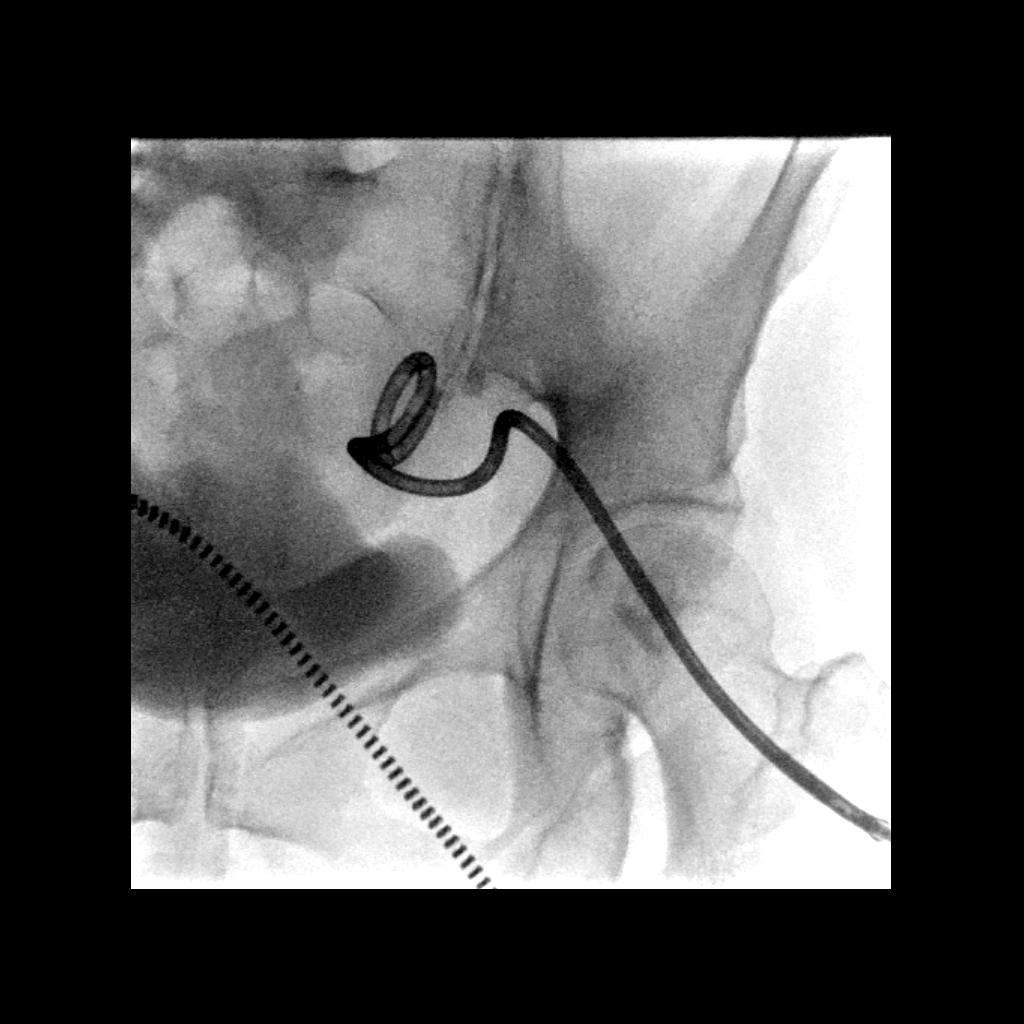
[frame 6/34]
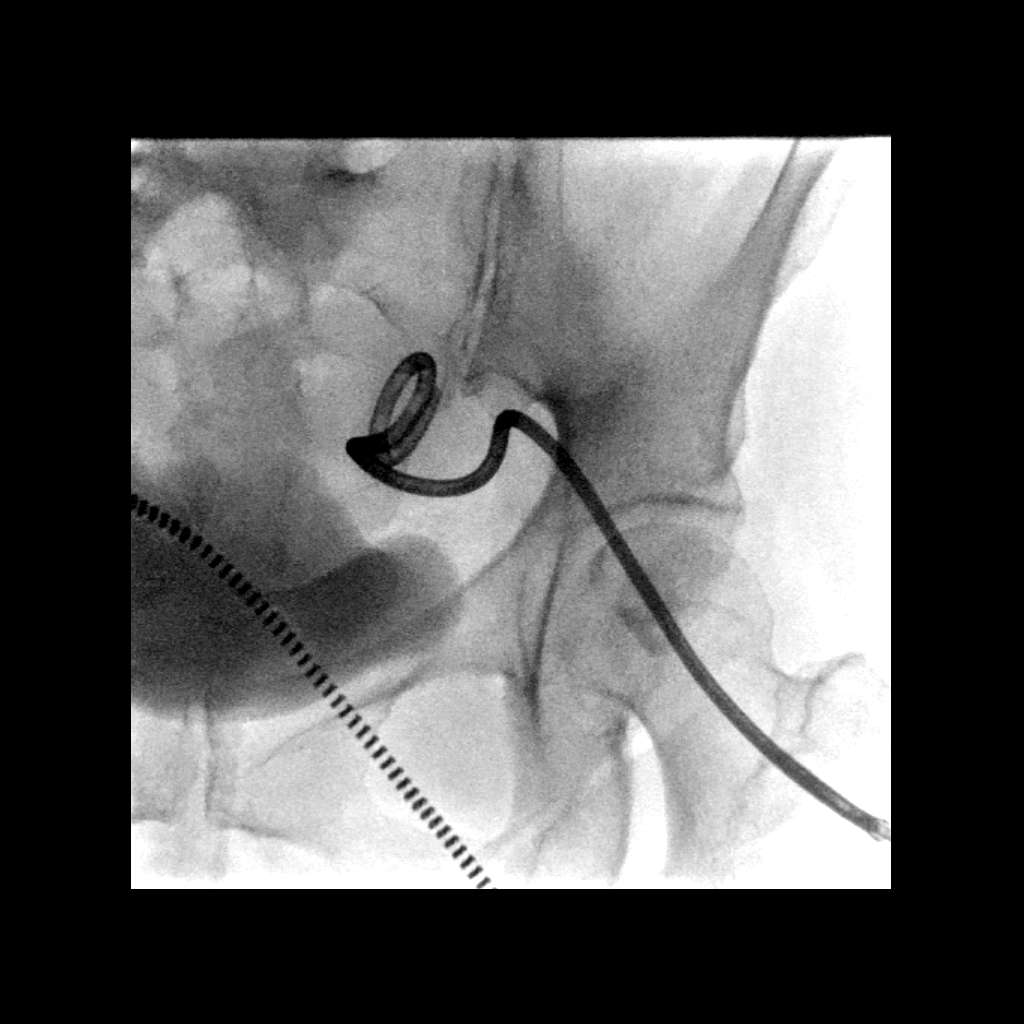
[frame 18/34]
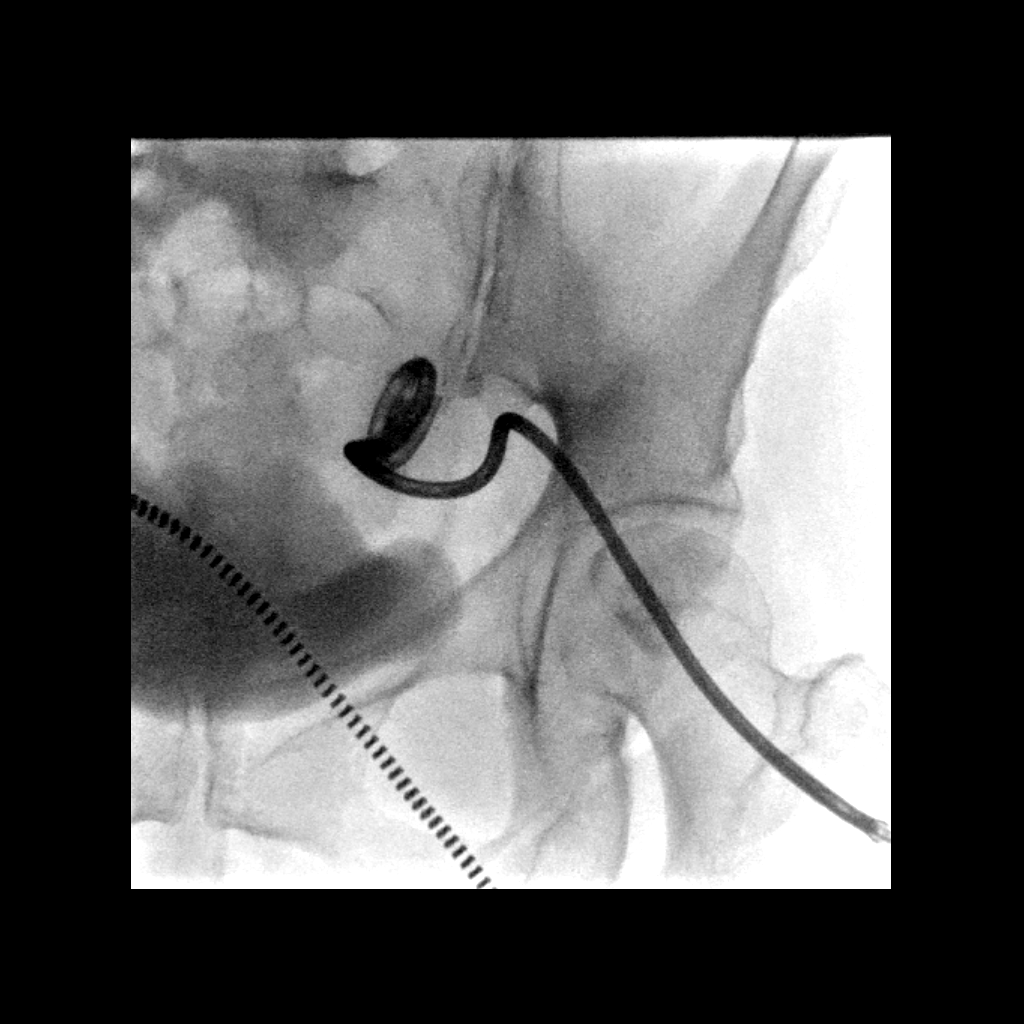
[frame 29/34]
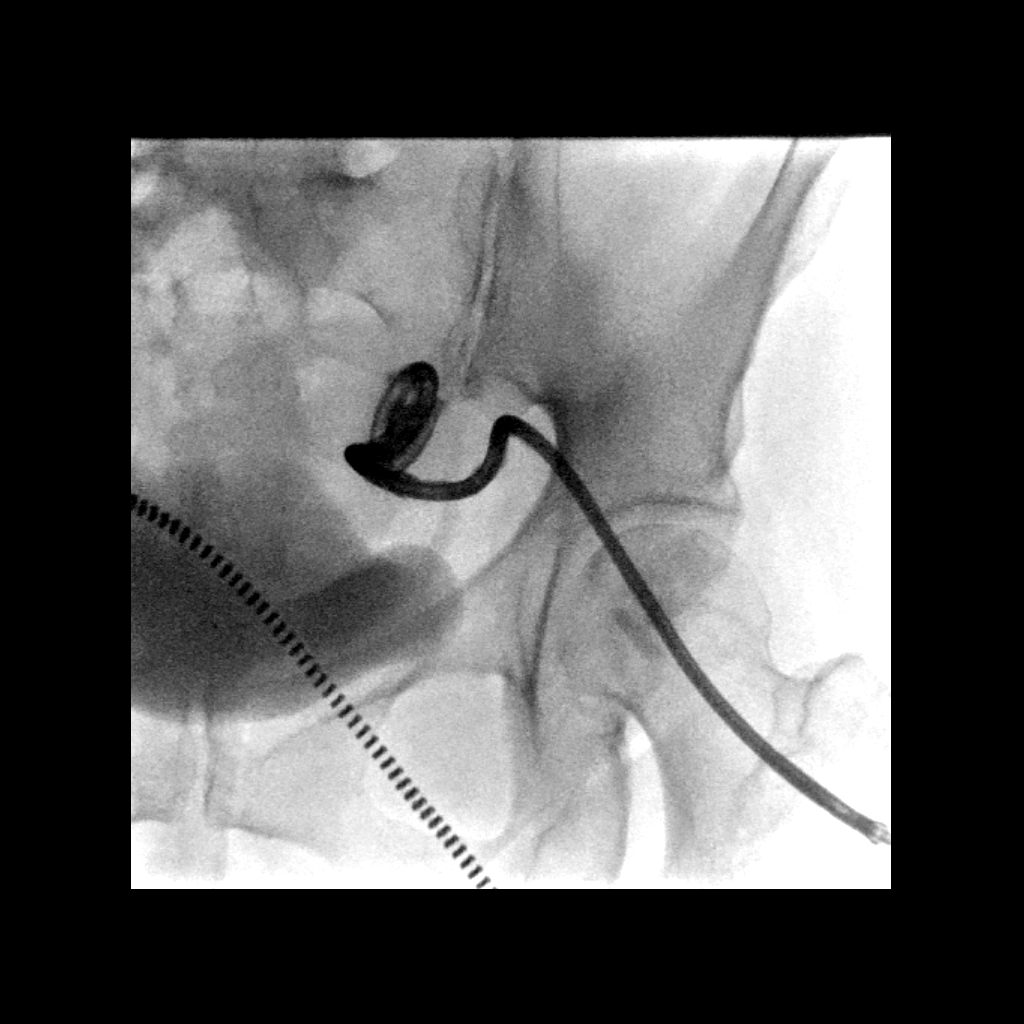

[Series 2: one shot · 1 of 1 slices shown]
[im 1/1]
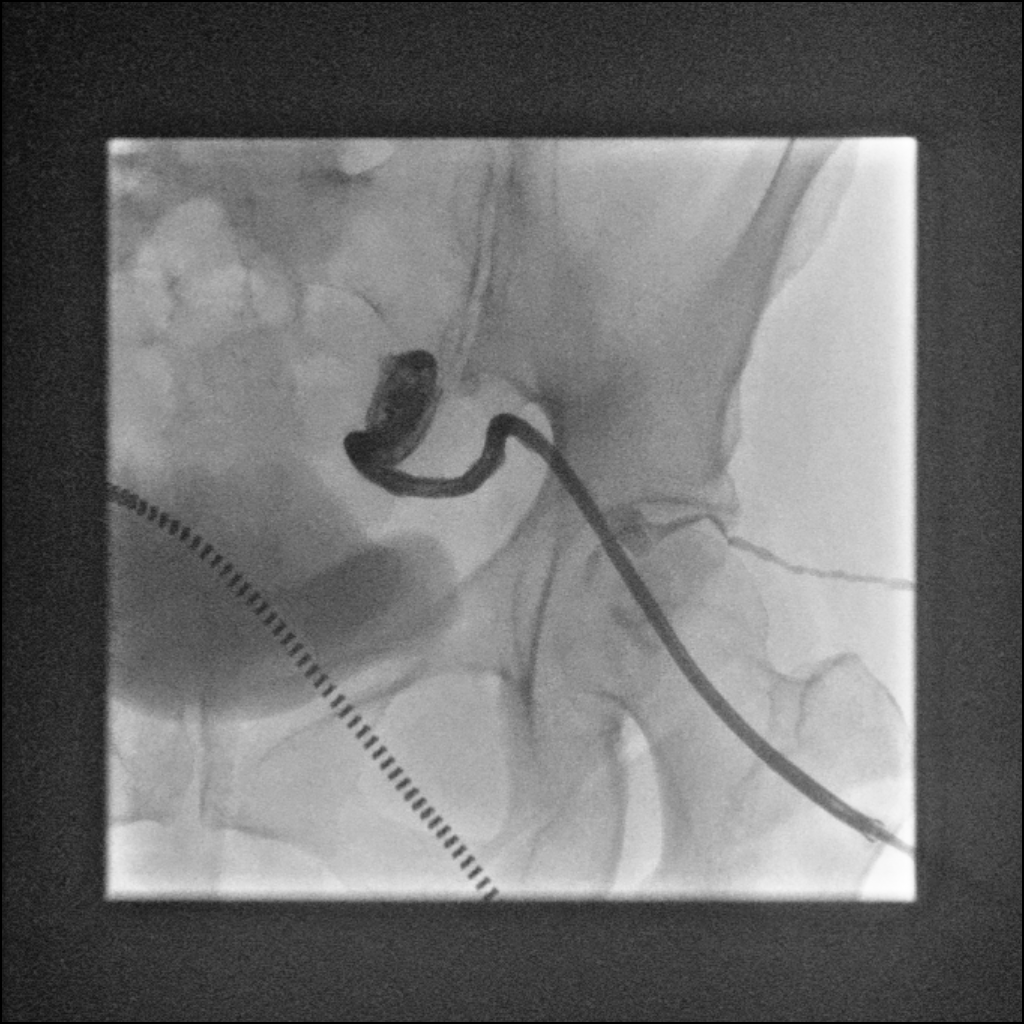

[5 of 5 positions shown; findings below may reference images not displayed]

EXAM:
INJECTION OF ABSCESS DRAINAGE CATHETER UNDER FLUOROSCOPY

MEDICATIONS:
None

ANESTHESIA/SEDATION:
None

COMPLICATIONS:
None immediate.

CONTRAST:  10 mL Omnipaque 300

FLUOROSCOPY TIME:  24 seconds.  13.2 mGy.

PROCEDURE:
The pre-existing right transgluteal drainage catheter was injected
with contrast material under fluoroscopy. Fluoroscopic spot images
were saved. The catheter was then cut and removed.
FINDINGS: Contrast injection demonstrates no significant residual abscess
cavity surrounding the percutaneous drainage catheter. There is no
demonstrated fistula to adjacent bowel.
IMPRESSION: Resolution of right-sided pelvic abscess with no fistula
demonstrated to bowel with injection of the drainage catheter. The
drainage catheter was removed following the injection procedure.

## 2017-05-20 IMAGING — CT CT ABD-PELV W/ CM
2 of 4 series · 15 of 46 positions shown, 17 images · IV contrast (iopamidol)
Comparison: 05/11/2017

CLINICAL DATA: Presumed diverticulitis and status post percutaneous
catheter drainage of right pelvic abscess on 05/06/2017.

EXAM:
CT ABDOMEN AND PELVIS WITH CONTRAST
TECHNIQUE: Multidetector CT imaging of the abdomen and pelvis was performed
using the standard protocol following bolus administration of
intravenous contrast.
CONTRAST:  100mL TQ2BIE-755 IOPAMIDOL ([id number]-755) INJECTION 61%

[Series 2: abd pelvis 5.00 br40 s3 ax · axial · 0.68mm/px · z∈[+1250,+1615]mm · 12 of 84 slices shown, 14 images]
[im 7/84  soft-tissue]
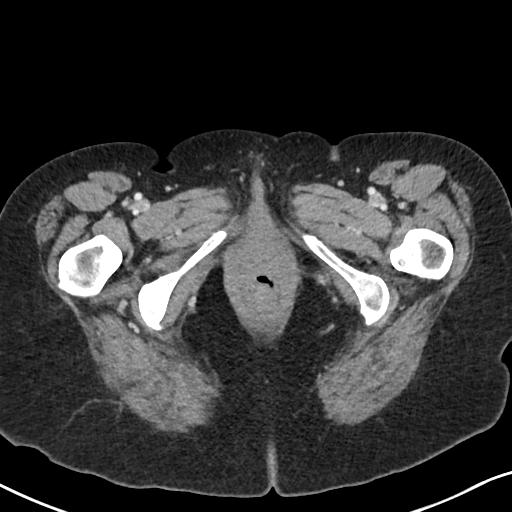
[im 7/84  bone]
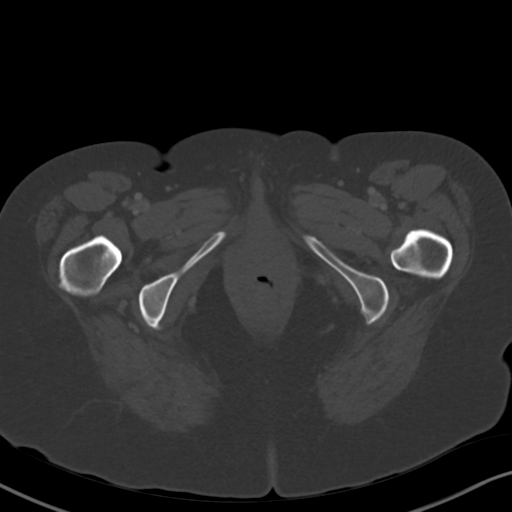
[im 14/84  soft-tissue]
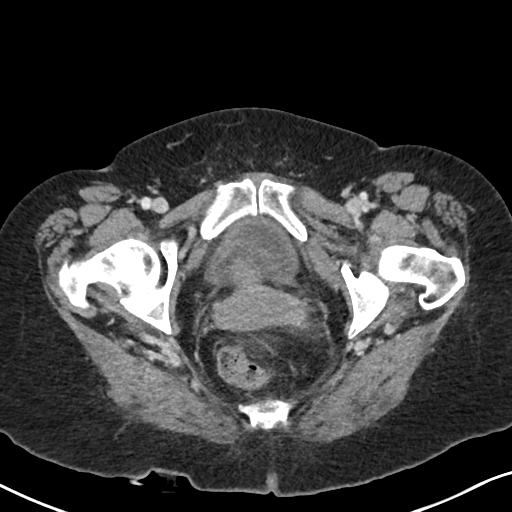
[im 20/84  soft-tissue]
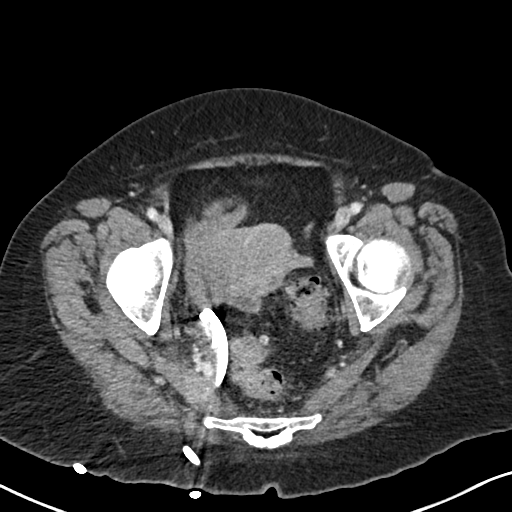
[im 27/84  soft-tissue]
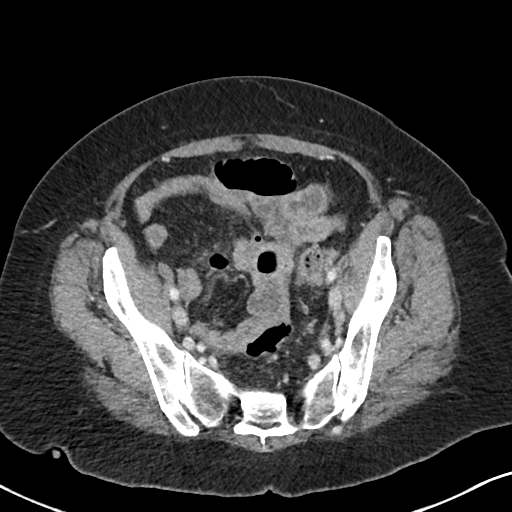
[im 34/84  soft-tissue]
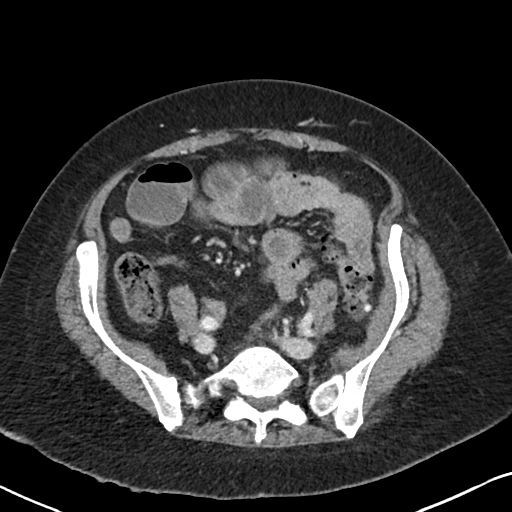
[im 40/84  soft-tissue]
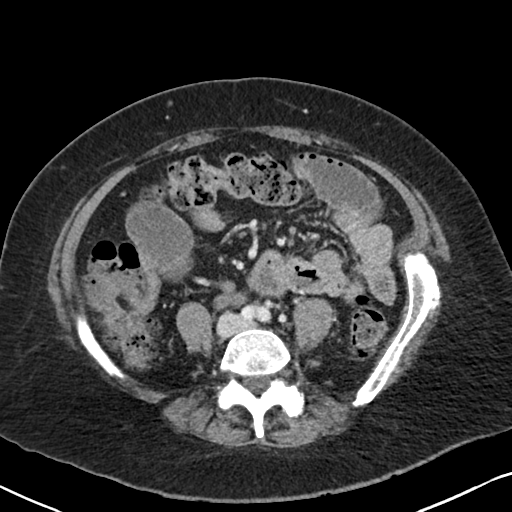
[im 47/84  soft-tissue]
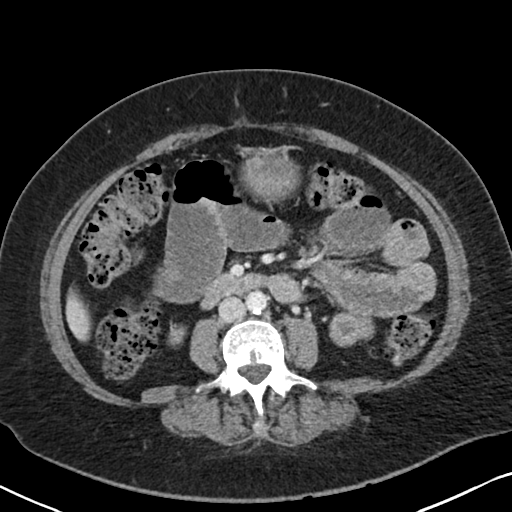
[im 54/84  soft-tissue]
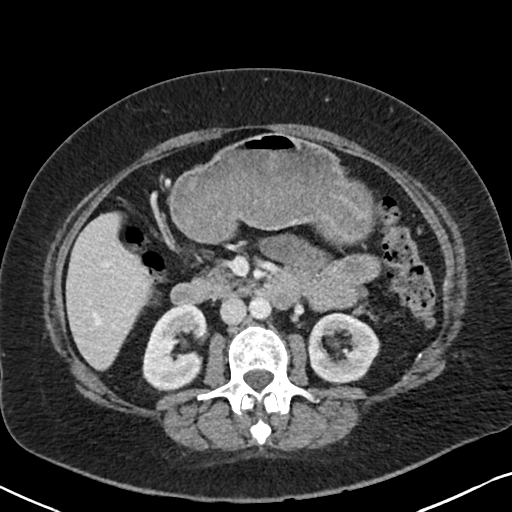
[im 60/84  soft-tissue]
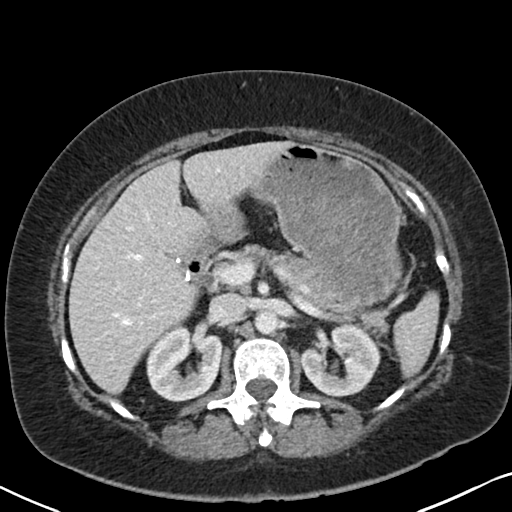
[im 60/84  bone]
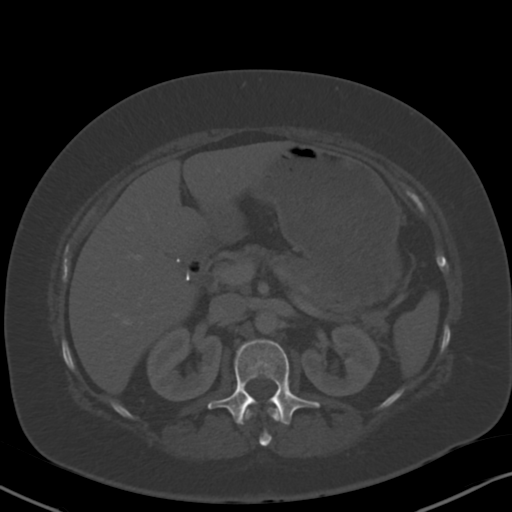
[im 67/84  soft-tissue]
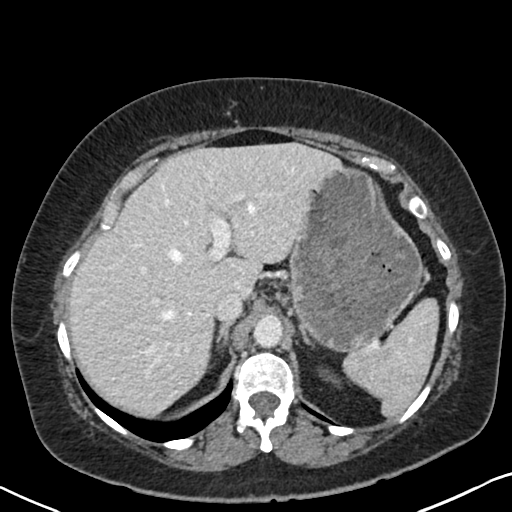
[im 74/84  soft-tissue]
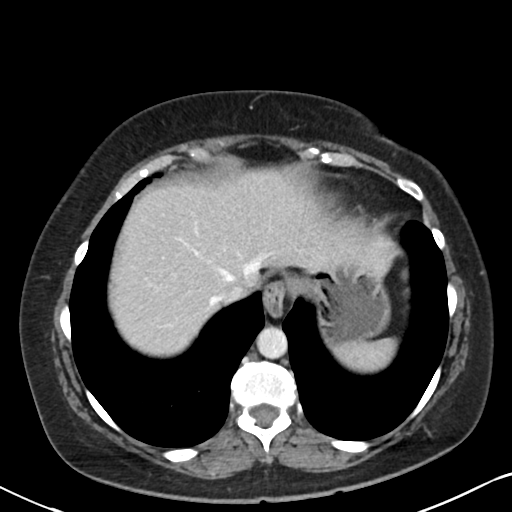
[im 80/84  soft-tissue]
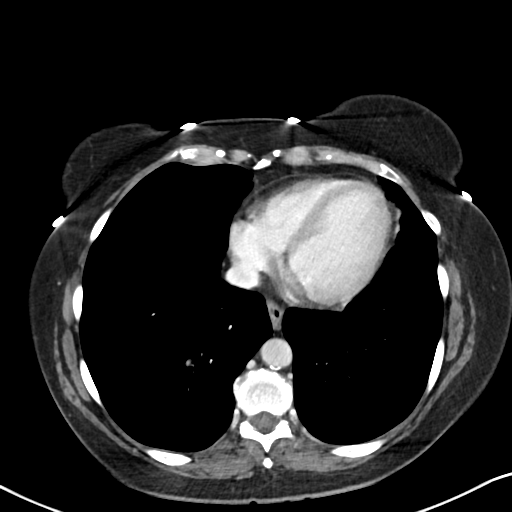

[Series 6: abd pelvis 2.00 br40 s3 cor · coronal · 0.68mm/px · 3 of 154 slices shown]
[im 52/154  soft-tissue]
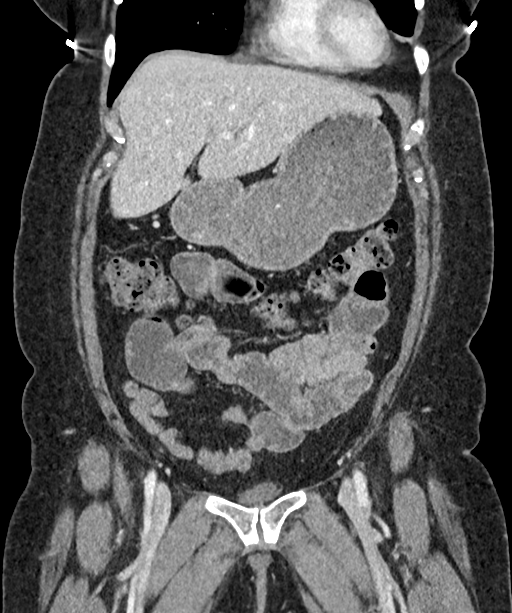
[im 69/154  soft-tissue]
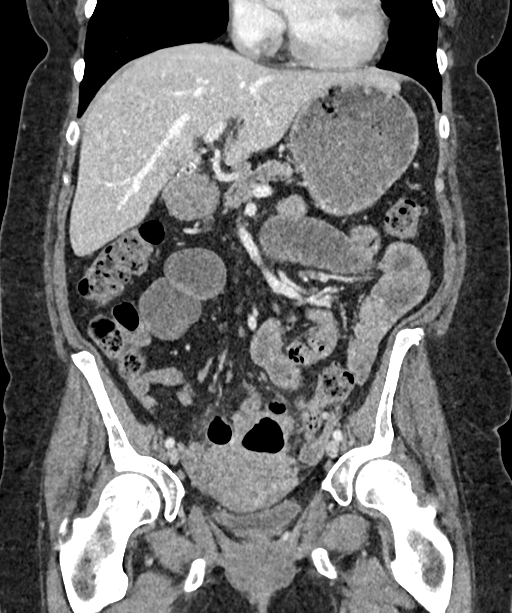
[im 86/154  soft-tissue]
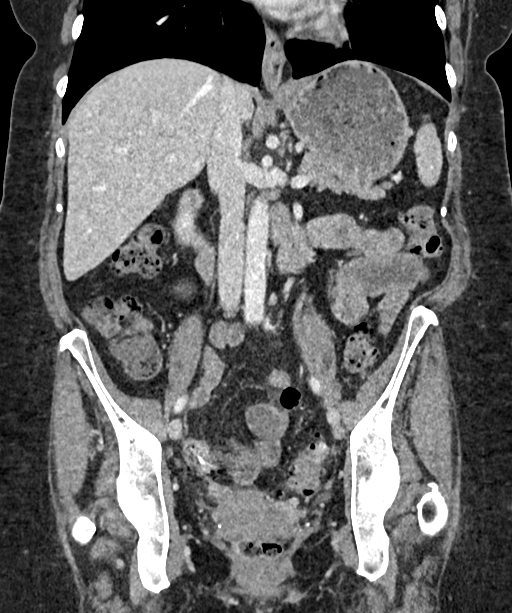

[15 of 46 positions shown; findings below may reference images not displayed]

FINDINGS: Lower chest: No acute abnormality.

Hepatobiliary: No focal liver abnormality is seen. Status post
cholecystectomy. No biliary dilatation.

Pancreas: Unremarkable. No pancreatic ductal dilatation or
surrounding inflammatory changes.

Spleen: Normal in size without focal abnormality.

Adrenals/Urinary Tract: Adrenal glands are unremarkable. Kidneys are
normal, without renal calculi, focal lesion, or hydronephrosis.
Bladder is unremarkable.

Stomach/Bowel: There remains mild to moderate dilatation of several
jejunal loops without overt obstruction. Abscess in the right pelvis
has resolved at the level of the indwelling transgluteal
percutaneous drain. There are two residual irregular shaped
collections of extraluminal loculated air in the pelvis. One lies in
the central pelvis and measures approximately 2.3 x 5.3 cm just
superior to the uterine fundus. A second collection more posterior
and slightly superior to the other collection is in the presacral
region and measures approximately 1.7 x 5.7 cm. There also is an
additional small locule of extraluminal gas present. The collection
just above the uterus contains a very small amount of fluid. These
areas of predominantly air did contain fluid on the prior scan dated
[DATE]. The presence of air would implicate communication with bowel
and in this location it is suspected that this represents a
contained small bowel perforation rather than sigmoid
diverticulitis. The sigmoid colon does contain diverticula but does
not appear inflamed or thickened on the current study. Next to the
pockets of air there is a loop of distal small bowel that appears
thickened and abnormal in appearance. Focal area of thickening is
somewhat rounded and measures nearly 3 cm. Underlying small bowel
mass cannot be excluded.

Vascular/Lymphatic: No arterial or venous abnormalities are
identified. No enlarged lymph nodes are identified.

Reproductive: Uterus and bilateral adnexa are unremarkable.

Other: No significant free fluid.  No hernias.

Musculoskeletal: No acute or significant osseous findings.
IMPRESSION: 1. Resolution of right pelvic abscess at the level of the current
indwelling drainage catheter. This drain was injected and also
removed today. See separate injection report and clinic note in
[REDACTED].
2. Etiology of the right pelvic abscess as well as current loculated
pockets of extraluminal air in the pelvis is felt be secondary to
localized small bowel perforation rather than sigmoid diverticulitis
based on current CT findings, as above. Area of thickened small
bowel appears abnormal in the region of loculated air and an
underlying small bowel mass cannot be excluded. There is also
residual evidence of associated proximal dilatation of small bowel
consistent with ileus/partial obstruction.

## 2017-05-20 MED ORDER — IOPAMIDOL (ISOVUE-300) INJECTION 61%
100.0000 mL | Freq: Once | INTRAVENOUS | Status: AC | PRN
  Administered 2017-05-20: 100 mL via INTRAVENOUS

## 2017-05-20 NOTE — Progress Notes (Signed)
Chief Complaint: F/U drain  Referring Physician(s): White,Christopher M  Supervising Physician: Irish Lack  History of Present Illness: Kim Snow is a 57 y.o. female with past medical history of hypertension, GERD, anxiety, iron deficiency anemia, depression, who was admitted to [facility] on 05/05/17 with several day history of abdominal pain, nausea, fever, diarrhea.    Subsequent imaging revealed multiple gas locules within the lower abdominal and pelvic mesentery consistent with hollow viscus perforation with gas and fluid collection in the right pelvis suspicious for abscess.    There were multiple loops of fluid-filled dilated small bowel consistent with mechanical bowel obstruction.    She underwent a CT-guided drainage of the right pelvic abscess by Dr. Loreta Ave on 05/06/2017.   F/U CT done on 05/11/2017 = Significant improvement in the right deep pelvis diverticular abscess status post percutaneous drain. Trace amount of fluid noted at the drain catheter site compared to the prior study. Pelvic sigmoid diverticulitis persist.  She is here today for drain injection.  She is still taking antibiotics.  She has get good track of her output. The last time she had 20 mL output was about a week ago.  She has been flushing with 5 mL and has about 10 mL output per day.  The fluid in the bulb is clear/tan in appearance.  She has a f/u appt with Gen Surg 05/27/2017.  Past Medical History:  Diagnosis Date  . Anemia   . Anxiety   . Asthma   . Depression   . GERD (gastroesophageal reflux disease)   . Hypertension   . Insomnia   . Migraine     Past Surgical History:  Procedure Laterality Date  . CESAREAN SECTION    . CHOLECYSTECTOMY    . KNEE ARTHROSCOPY      Allergies: Patient has no known allergies.  Medications: Prior to Admission medications   Medication Sig Start Date End Date Taking? Authorizing Provider  acetaminophen (TYLENOL)  325 MG tablet Take 2 tablets (650 mg total) by mouth every 6 (six) hours as needed for mild pain (or temp > 100). 05/11/17   Berna Bue, MD  ALPRAZolam Prudy Feeler) 1 MG tablet Take 1 mg by mouth. .5 MG IN THE AM, .5 MG IN THE AFTERNOON AND .5 MG AT BEDTIME 02/11/17   [provider]  amoxicillin-clavulanate (AUGMENTIN) 875-125 MG tablet Take 1 tablet by mouth 2 (two) times daily for 14 days. 05/11/17 05/25/17  Berna Bue, MD  CALCIUM PO Take 1,000 mg by mouth daily.    [provider]  Cyanocobalamin (VITAMIN B 12 PO) Take 1 tablet by mouth daily.    [provider]  docusate sodium (COLACE) 100 MG capsule Take 1 capsule (100 mg total) by mouth 2 (two) times daily. 05/11/17   Berna Bue, MD  losartan-hydrochlorothiazide (HYZAAR) 100-25 MG tablet Take 1 tablet by mouth daily. 03/11/17   [provider]  meloxicam (MOBIC) 15 MG tablet Take 15 mg by mouth daily at 12 noon. 04/21/17   [provider]  omeprazole (PRILOSEC) 40 MG capsule Take 40 mg by mouth daily. 03/28/17   [provider]  oxyCODONE (OXY IR/ROXICODONE) 5 MG immediate release tablet Take 1 tablet (5 mg total) by mouth every 6 (six) hours as needed for severe pain or breakthrough pain. 05/11/17   Berna Bue, MD  Polysaccharide Iron Complex (IFEREX 150 PO) Take 150 mg by mouth daily.    [provider]  [facility]  vaginal cream Place 1 Applicatorful vaginally Nightly. 04/30/17   [provider]     No family history on file.  Social History   Socioeconomic History  . Marital status: Married    Spouse name: Not on file  . Number of children: Not on file  . Years of education: Not on file  . Highest education level: Not on file  Occupational History  . Not on file  Social Needs  . Financial resource strain: Not on file  . Food insecurity:    Worry: Not on file    Inability: Not on file  . Transportation needs:    Medical: Not on file     Non-medical: Not on file  Tobacco Use  . Smoking status: Never Smoker  . Smokeless tobacco: Never Used  Substance and Sexual Activity  . Alcohol use: Yes    Comment: social  . Drug use: Never  . Sexual activity: Not on file  Lifestyle  . Physical activity:    Days per week: Not on file    Minutes per session: Not on file  . Stress: Not on file  Relationships  . Social connections:    Talks on phone: Not on file    Gets together: Not on file    Attends religious service: Not on file    Active member of club or organization: Not on file    Attends meetings of clubs or organizations: Not on file    Relationship status: Not on file  Other Topics Concern  . Not on file  Social History Narrative  . Not on file   Review of Systems  Vital Signs: There were no vitals taken for this visit.  Physical Exam Awake and alert NAD Drain in place right trans-gluteal region Drain injection today does NOT show fistulous connection to the bowel  Imaging: Ct Abdomen Pelvis Wo Contrast  Result Date: 05/05/2017 CLINICAL DATA:  Diffuse abdominal pain for 4 days with nausea and diarrhea EXAM: CT ABDOMEN AND PELVIS WITHOUT CONTRAST TECHNIQUE: Multidetector CT imaging of the abdomen and pelvis was performed following the standard protocol without IV contrast. COMPARISON:  Radiograph 05/05/2017 FINDINGS: Lower chest: Lung bases demonstrate no consolidation or pleural effusion. The heart size is within normal limits. Small hiatal hernia Hepatobiliary: No focal liver abnormality is seen. Status post cholecystectomy. No biliary dilatation. Pancreas: Unremarkable. No pancreatic ductal dilatation or surrounding inflammatory changes. Spleen: Normal in size without focal abnormality. Adrenals/Urinary Tract: Adrenal glands are unremarkable. Kidneys are normal, without renal calculi, focal lesion, or hydronephrosis. Bladder is unremarkable. Stomach/Bowel: Multiple loops of dilated fluid-filled small bowel  measuring up to 4.1 cm consistent with small bowel obstruction. Transition point visualized within the central pelvis slightly to the right of midline. Normal appendix. Sigmoid colon diverticular disease with some wall thickening present. Perforated viscus as evidence by multiple extraluminal gas collections within the lower abdominal and pelvic mesentery. Possible source at the distal sigmoid colon, series 2, image number 7 and coronal series 5, image number 72. Mildly thickened bowel loop with gas adjacent to it, series 2, image number 64. Vascular/Lymphatic: Mild aortic atherosclerosis. No aneurysmal dilatation. Mild retroperitoneal lymph nodes. Reproductive: Uterus and bilateral adnexa are unremarkable. Other: Small amount of free fluid in the pelvis. Right pelvic gas and fluid collection measuring 7.8 x 4.1 cm, appears contiguous on coronal views with gas and fluid adjacent to the sigmoid colon. Musculoskeletal: No acute or significant osseous findings. IMPRESSION: 1. Multiple gas locules within the lower abdominal and  pelvic mesentery consistent with hollow viscus perforation. Suspected source is distal sigmoid colon diverticulitis where there is gas and fluid that appears contiguous with a gas and fluid collection in the right pelvis measuring 7.8 x 4.1 cm, suspect for abscess. 2. Multiple loops of fluid-filled dilated small bowel consistent with mechanical bowel obstruction; transition point is in the central pelvis slightly to the right of midline, adjacent to the gas and fluid collection in the right pelvis. Critical Value/emergent results were called by telephone at the time of interpretation on 05/05/2017 at 4:14 pm to Dr. Linwood Dibbles , who verbally acknowledged these results. Electronically Signed   By: Jasmine Pang M.D.   On: 05/05/2017 16:14   Ct Abdomen Pelvis W Contrast  Result Date: 05/11/2017 CLINICAL DATA:  Diverticular abscess, status post percutaneous drain 05/06/2017 EXAM: CT ABDOMEN AND  PELVIS WITH CONTRAST TECHNIQUE: Multidetector CT imaging of the abdomen and pelvis was performed using the standard protocol following bolus administration of intravenous contrast. CONTRAST:  ISOVUE-300 IOPAMIDOL (ISOVUE-300) INJECTION 61% COMPARISON:  05/05/2017 FINDINGS: Lower chest: Normal heart size. No pericardial effusion. Trace pleural effusions with minimal basilar atelectasis. Hepatobiliary: No focal liver abnormality is seen. Status post cholecystectomy. No biliary dilatation. Pancreas: Unremarkable. No pancreatic ductal dilatation or surrounding inflammatory changes. Spleen: Normal in size without focal abnormality. Adrenals/Urinary Tract: Adrenal glands are unremarkable. Kidneys are normal, without renal calculi, focal lesion, or hydronephrosis. Bladder is unremarkable. Stomach/Bowel: There remain multiple dilated loops of fluid-filled small bowel throughout the abdomen extending into the pelvis. Suspicious for bowel obstruction versus ileus. Right trans gluteal percutaneous drain has been inserted. Trace amount of residual fluid at the drain catheter site. Overall significant improvement in the right pelvic abscess since placement. Inflammation and edema persist about the pelvis along the sigmoid colon compatible with diverticulitis. No new abdominal or pelvic fluid collections. Vascular/Lymphatic: Aortic atherosclerosis. Negative for aneurysm. No adenopathy. Mesenteric and renal vasculature appear patent. No occlusive vascular process. Reproductive: Uterus and adnexa normal in size. Other: No inguinal or abdominal wall hernia. Musculoskeletal: Body anasarca noted. Degenerative changes of the spine and facet arthropathy. No acute osseous finding. IMPRESSION: Significant improvement in the right deep pelvis diverticular abscess status post percutaneous drain. Trace amount of fluid noted at the drain catheter site compared to the prior study. Pelvic sigmoid diverticulitis persist. Persistent dilated  small bowel throughout the abdomen and pelvis terminating in the pelvis compatible with mechanical small-bowel obstruction, less likely ileus. Electronically Signed   By: Judie Petit.  Shick M.D.   On: 05/11/2017 11:35   Ct Image Guided Drainage By Percutaneous Catheter  Result Date: 05/06/2017 INDICATION: 57 year old female with pelvic abscess, likely secondary to diverticulitis EXAM: CT GUIDED DRAINAGE OF  ABSCESS MEDICATIONS: The patient is currently admitted to the hospital and receiving intravenous antibiotics. The antibiotics were administered within an appropriate time frame prior to the initiation of the procedure. ANESTHESIA/SEDATION: 2.5 mg IV Versed 150 mcg IV Fentanyl Moderate Sedation Time:  19 minutes The patient was continuously monitored during the procedure by the interventional radiology nurse under my direct supervision. COMPLICATIONS: None TECHNIQUE: Informed written consent was obtained from the patient after a thorough discussion of the procedural risks, benefits and alternatives. All questions were addressed. Maximal Sterile Barrier Technique was utilized including caps, mask, sterile gowns, sterile gloves, sterile drape, hand hygiene and skin antiseptic. A timeout was performed prior to the initiation of the procedure. PROCEDURE: The operative field was prepped with Chlorhexidine in a sterile fashion, and a sterile drape was applied  covering the operative field. A sterile gown and sterile gloves were used for the procedure. Local anesthesia was provided with 1% Lidocaine. Patient is position prone position on the CT gantry table. Scout CT was acquired for planning purposes. The patient is prepped and draped in the usual sterile fashion. The skin and subcutaneous tissues were generously infiltrated 1% lidocaine for local anesthesia. Using modified Seldinger technique, a 12 French pigtail drainage catheter was placed via a right transgluteal approach into the abscess cavity. Proximally 60 cc of  purulent material was aspirated. Pigtail drainage catheter attached to bulb suction. Patient tolerated the procedure well and remained hemodynamically stable throughout. No complications were encountered and no significant blood loss. FINDINGS: Scout CT demonstrates gas and fluid collection within the right aspect of the pelvis. Final CT image demonstrates placement of 12 French drain into the fluid collection with relatively complete decompression. Approximately 60 cc of purulent material aspirated. IMPRESSION: Status post CT-guided drainage of pelvic abscess, right trans gluteal approach. Signed, Yvone Neu. Loreta Ave, DO [facility] Electronically Signed   By: Gilmer Mor D.O.   On: 05/06/2017 13:57    Labs:  CBC: Recent Labs    05/05/17 1328 05/06/17 0539 05/07/17 0452 05/11/17 0446  WBC 20.2* 6.1 9.9 16.3*  HGB 11.9* 9.0* 9.9* 10.0*  HCT 35.2* 26.0* 30.1* 29.8*  PLT 431* 360 338 443*    COAGS: Recent Labs    05/06/17 0539  INR 1.04  APTT 29    BMP: Recent Labs    05/06/17 0539 05/07/17 0452 05/08/17 0435 05/11/17 0446  NA 136 136 138 135  K 3.1* 3.4* 3.6 3.6  CL 104 102 106 98*  CO2 24 23 24 27   GLUCOSE 118* 125* 104* 102*  BUN 22* 16 9 7   CALCIUM 7.9* 8.2* 8.2* 8.4*  CREATININE 1.11* 0.86 0.57 0.70  GFRNONAA 54* >60 >60 >60  GFRAA >60 >60 >60 >60    LIVER FUNCTION TESTS: Recent Labs    05/05/17 1328 05/06/17 0539 05/07/17 0452  BILITOT 3.7* 2.3* 1.0  AST 27 12* 17  ALT 25 18 18   ALKPHOS 186* 131* 125  PROT 7.8 5.9* 6.5  ALBUMIN 3.1* 2.3* 2.3*    TUMOR MARKERS: No results for input(s): AFPTM, CEA, CA199, CHROMGRNA in the last 8760 hours.  Assessment:  Right pelvic abscess  S/P CT guided drain placement by Dr. Loreta Ave on 05/06/2017  CT scan today show complete resolution of the abscess and drain injection does NOT show fistulous connection to the bowel.  Drain removed today with no  complications.  Keep planned follow up appointment with general surgery on 05/27/17.  Electronically Signed: Gwynneth Macleod PA-C 05/20/2017, 2:52 PM   Please refer to Dr. Antonietta Jewel attestation of this note for management and plan.

## 2017-05-21 ENCOUNTER — Inpatient Hospital Stay (HOSPITAL_COMMUNITY)
Admission: EM | Admit: 2017-05-21 | Discharge: 2017-05-29 | DRG: 331 | Disposition: A | Discharge status: Home Health | Payer: BC Managed Care – PPO | Attending: General Surgery | Admitting: General Surgery

## 2017-05-21 ENCOUNTER — Encounter (HOSPITAL_COMMUNITY): Payer: Self-pay | Admitting: Emergency Medicine

## 2017-05-21 ENCOUNTER — Other Ambulatory Visit: Payer: Self-pay

## 2017-05-21 DIAGNOSIS — K668 Other specified disorders of peritoneum: Secondary | ICD-10-CM | POA: Diagnosis present

## 2017-05-21 DIAGNOSIS — Z791 Long term (current) use of non-steroidal anti-inflammatories (NSAID): Secondary | ICD-10-CM

## 2017-05-21 DIAGNOSIS — J45909 Unspecified asthma, uncomplicated: Secondary | ICD-10-CM | POA: Diagnosis present

## 2017-05-21 DIAGNOSIS — F419 Anxiety disorder, unspecified: Secondary | ICD-10-CM | POA: Diagnosis present

## 2017-05-21 DIAGNOSIS — K219 Gastro-esophageal reflux disease without esophagitis: Secondary | ICD-10-CM | POA: Diagnosis present

## 2017-05-21 DIAGNOSIS — I10 Essential (primary) hypertension: Secondary | ICD-10-CM | POA: Diagnosis present

## 2017-05-21 DIAGNOSIS — K5732 Diverticulitis of large intestine without perforation or abscess without bleeding: Secondary | ICD-10-CM | POA: Diagnosis present

## 2017-05-21 DIAGNOSIS — E785 Hyperlipidemia, unspecified: Secondary | ICD-10-CM | POA: Diagnosis present

## 2017-05-21 DIAGNOSIS — K5720 Diverticulitis of large intestine with perforation and abscess without bleeding: Principal | ICD-10-CM | POA: Diagnosis present

## 2017-05-21 DIAGNOSIS — K631 Perforation of intestine (nontraumatic): Secondary | ICD-10-CM

## 2017-05-21 DIAGNOSIS — K651 Peritoneal abscess: Secondary | ICD-10-CM

## 2017-05-21 DIAGNOSIS — Z79899 Other long term (current) drug therapy: Secondary | ICD-10-CM

## 2017-05-21 DIAGNOSIS — D649 Anemia, unspecified: Secondary | ICD-10-CM | POA: Diagnosis present

## 2017-05-21 DIAGNOSIS — R933 Abnormal findings on diagnostic imaging of other parts of digestive tract: Secondary | ICD-10-CM

## 2017-05-21 LAB — CBC
HEMATOCRIT: 32.1 % — AB (ref 36.0–46.0)
HEMOGLOBIN: 10.5 g/dL — AB (ref 12.0–15.0)
MCH: 30.2 pg (ref 26.0–34.0)
MCHC: 32.7 g/dL (ref 30.0–36.0)
MCV: 92.2 fL (ref 78.0–100.0)
PLATELETS: 695 10*3/uL — AB (ref 150–400)
RBC: 3.48 MIL/uL — AB (ref 3.87–5.11)
RDW: 13.4 % (ref 11.5–15.5)
WBC: 10.5 10*3/uL (ref 4.0–10.5)

## 2017-05-21 LAB — URINALYSIS, ROUTINE W REFLEX MICROSCOPIC
BACTERIA UA: NONE SEEN
BILIRUBIN URINE: NEGATIVE
Glucose, UA: NEGATIVE mg/dL
KETONES UR: NEGATIVE mg/dL
LEUKOCYTES UA: NEGATIVE
Nitrite: NEGATIVE
PH: 5 (ref 5.0–8.0)
PROTEIN: NEGATIVE mg/dL
SQUAMOUS EPITHELIAL / LPF: NONE SEEN
Specific Gravity, Urine: 1.012 (ref 1.005–1.030)
WBC UA: NONE SEEN WBC/hpf (ref 0–5)

## 2017-05-21 LAB — COMPREHENSIVE METABOLIC PANEL
ALT: 22 U/L (ref 14–54)
AST: 23 U/L (ref 15–41)
Albumin: 3.4 g/dL — ABNORMAL LOW (ref 3.5–5.0)
Alkaline Phosphatase: 161 U/L — ABNORMAL HIGH (ref 38–126)
Anion gap: 11 (ref 5–15)
BUN: 22 mg/dL — ABNORMAL HIGH (ref 6–20)
CHLORIDE: 100 mmol/L — AB (ref 101–111)
CO2: 27 mmol/L (ref 22–32)
CREATININE: 1.04 mg/dL — AB (ref 0.44–1.00)
Calcium: 9.1 mg/dL (ref 8.9–10.3)
GFR, EST NON AFRICAN AMERICAN: 58 mL/min — AB (ref >60)
Glucose, Bld: 133 mg/dL — ABNORMAL HIGH (ref 65–99)
POTASSIUM: 3.8 mmol/L (ref 3.5–5.1)
SODIUM: 138 mmol/L (ref 135–145)
Total Bilirubin: 0.3 mg/dL (ref 0.3–1.2)
Total Protein: 7.9 g/dL (ref 6.5–8.1)

## 2017-05-21 LAB — LIPASE, BLOOD: LIPASE: 28 U/L (ref 11–51)

## 2017-05-21 MED ORDER — DIPHENHYDRAMINE HCL 25 MG PO CAPS: 25.0000 mg | ORAL_CAPSULE | Freq: Four times a day (QID) | ORAL | Status: DC | PRN

## 2017-05-21 MED ORDER — PIPERACILLIN-TAZOBACTAM 3.375 G IVPB
3.3750 g | Freq: Three times a day (TID) | INTRAVENOUS | Status: DC
  Administered 2017-05-21 – 2017-05-29 (×23): 3.375 g via INTRAVENOUS
  Filled 2017-05-21 (×24): qty 50

## 2017-05-21 MED ORDER — ACETAMINOPHEN 325 MG PO TABS
650.0000 mg | ORAL_TABLET | Freq: Four times a day (QID) | ORAL | Status: DC | PRN
  Administered 2017-05-22: 650 mg via ORAL
  Filled 2017-05-21: qty 2

## 2017-05-21 MED ORDER — PANTOPRAZOLE SODIUM 40 MG PO TBEC
40.0000 mg | DELAYED_RELEASE_TABLET | Freq: Every day | ORAL | Status: DC
  Administered 2017-05-22 – 2017-05-29 (×8): 40 mg via ORAL
  Filled 2017-05-21 (×9): qty 1

## 2017-05-21 MED ORDER — ACETAMINOPHEN 650 MG RE SUPP: 650.0000 mg | Freq: Four times a day (QID) | RECTAL | Status: DC | PRN

## 2017-05-21 MED ORDER — HYDROCODONE-ACETAMINOPHEN 5-325 MG PO TABS
1.0000 | ORAL_TABLET | ORAL | Status: DC | PRN
  Administered 2017-05-21 – 2017-05-22 (×2): 2 via ORAL
  Filled 2017-05-21 (×2): qty 2

## 2017-05-21 MED ORDER — ONDANSETRON 4 MG PO TBDP: 4.0000 mg | ORAL_TABLET | Freq: Four times a day (QID) | ORAL | Status: DC | PRN

## 2017-05-21 MED ORDER — POTASSIUM CHLORIDE IN NACL 20-0.9 MEQ/L-% IV SOLN
INTRAVENOUS | Status: DC
  Administered 2017-05-21 – 2017-05-24 (×6): via INTRAVENOUS
  Administered 2017-05-25: 1000 mL via INTRAVENOUS
  Administered 2017-05-25 – 2017-05-26 (×2): via INTRAVENOUS
  Filled 2017-05-21 (×11): qty 1000

## 2017-05-21 MED ORDER — DIPHENHYDRAMINE HCL 50 MG/ML IJ SOLN: 25.0000 mg | Freq: Four times a day (QID) | INTRAMUSCULAR | Status: DC | PRN

## 2017-05-21 MED ORDER — ONDANSETRON HCL 4 MG/2ML IJ SOLN
4.0000 mg | Freq: Four times a day (QID) | INTRAMUSCULAR | Status: DC | PRN
  Administered 2017-05-22: 4 mg via INTRAVENOUS
  Filled 2017-05-21: qty 2

## 2017-05-21 MED ORDER — MORPHINE SULFATE (PF) 2 MG/ML IV SOLN
1.0000 mg | INTRAVENOUS | Status: DC | PRN
  Administered 2017-05-21 – 2017-05-23 (×4): 2 mg via INTRAVENOUS
  Filled 2017-05-21 (×4): qty 1

## 2017-05-21 MED ORDER — ALPRAZOLAM 1 MG PO TABS
1.0000 mg | ORAL_TABLET | Freq: Every evening | ORAL | Status: DC | PRN
  Administered 2017-05-21 – 2017-05-28 (×8): 1 mg via ORAL
  Filled 2017-05-21 (×8): qty 1

## 2017-05-21 NOTE — ED Notes (Signed)
ED TO INPATIENT HANDOFF REPORT  Name/Age/Gender Kim Snow 57 y.o. female  Code Status    Code Status Orders  (From admission, onward)        Start     Ordered   05/21/17 1733  Full code  Continuous     05/21/17 1737    Code Status History    Date Active Date Inactive Code Status Order ID Comments User Context   05/05/2017 1952 05/11/2017 1814 Full Code [id number]  Greer Pickerel, MD ED      Home/SNF/Other Home  Chief Complaint stomach pain  Level of Care/Admitting Diagnosis ED Disposition    ED Disposition Condition [facility] [100102]  Level of Care: Med-Surg [16]  Diagnosis: Diverticulitis large intestine [662947]  Admitting Physician: CCS, Loris  Attending Physician: CCS, MD [[id number]  PT Class (Do Not Modify): Observation [104]  PT Acc Code (Do Not Modify): Observation [10022]       Medical History Past Medical History:  Diagnosis Date  . Anemia   . Anxiety   . Asthma   . Depression   . GERD (gastroesophageal reflux disease)   . Hypertension   . Insomnia   . Migraine     Allergies No Known Allergies  IV Location/Drains/Wounds Patient Lines/Drains/Airways Status   Active Line/Drains/Airways    Name:   Placement date:   Placement time:   Site:   Days:   Peripheral IV 05/21/17 Left Forearm   05/21/17    1755    Forearm   less than 1          Labs/Imaging Results for orders placed or performed during the hospital encounter of 05/21/17 (from the past 48 hour(s))  Lipase, blood     Status: None   Collection Time: 05/21/17  2:32 PM  Result Value Ref Range   Lipase 28 11 - 51 U/L    Comment: Performed at [facility], [place] [address], [place], [place] [address]  Comprehensive metabolic panel     Status: Abnormal   Collection Time: 05/21/17  2:32 PM  Result Value Ref Range   Sodium 138 135 - 145 mmol/L   Potassium 3.8 3.5 - 5.1 mmol/L   Chloride 100 (L) 101 - 111  mmol/L   CO2 27 22 - 32 mmol/L   Glucose, Bld 133 (H) 65 - 99 mg/dL   BUN 22 (H) 6 - 20 mg/dL   Creatinine, Ser 1.04 (H) 0.44 - 1.00 mg/dL   Calcium 9.1 8.9 - 10.3 mg/dL   Total Protein 7.9 6.5 - 8.1 g/dL   Albumin 3.4 (L) 3.5 - 5.0 g/dL   AST 23 15 - 41 U/L   ALT 22 14 - 54 U/L   Alkaline Phosphatase 161 (H) 38 - 126 U/L   Total Bilirubin 0.3 0.3 - 1.2 mg/dL   GFR calc non Af Amer 58 (L) >60 mL/min   GFR calc Af Amer >60 >60 mL/min    Comment: (NOTE) The eGFR has been calculated using the CKD EPI equation. This calculation has not been validated in all clinical situations. eGFR's persistently <60 mL/min signify possible Chronic Kidney Disease.    Anion gap 11 5 - 15    Comment: Performed at [facility], [place] [address], [place], [place] [address]  CBC     Status: Abnormal   Collection Time: 05/21/17  2:32 PM  Result Value Ref Range   WBC 10.5 4.0 -  10.5 K/uL   RBC 3.48 (L) 3.87 - 5.11 MIL/uL   Hemoglobin 10.5 (L) 12.0 - 15.0 g/dL   HCT 32.1 (L) 36.0 - 46.0 %   MCV 92.2 78.0 - 100.0 fL   MCH 30.2 26.0 - 34.0 pg   MCHC 32.7 30.0 - 36.0 g/dL   RDW 13.4 11.5 - 15.5 %   Platelets 695 (H) 150 - 400 K/uL    Comment: Performed at [facility], [place] [address], [place], [place] [address]  Urinalysis, Routine w reflex microscopic     Status: Abnormal   Collection Time: 05/21/17  3:34 PM  Result Value Ref Range   Color, Urine YELLOW YELLOW   APPearance CLEAR CLEAR   Specific Gravity, Urine 1.012 1.005 - 1.030   pH 5.0 5.0 - 8.0   Glucose, UA NEGATIVE NEGATIVE mg/dL   Hgb urine dipstick MODERATE (A) NEGATIVE   Bilirubin Urine NEGATIVE NEGATIVE   Ketones, ur NEGATIVE NEGATIVE mg/dL   Protein, ur NEGATIVE NEGATIVE mg/dL   Nitrite NEGATIVE NEGATIVE   Leukocytes, UA NEGATIVE NEGATIVE   RBC / HPF 0-5 0 - 5 RBC/hpf   WBC, UA NONE SEEN 0 - 5 WBC/hpf   Bacteria, UA NONE SEEN NONE SEEN   Squamous Epithelial / LPF NONE SEEN NONE SEEN   Mucus  PRESENT     Comment: Performed at [facility], [place] [address] [place], [place] [address]   Ct Abdomen Pelvis W Contrast  Result Date: 05/21/2017 CLINICAL DATA:  Presumed diverticulitis and status post percutaneous catheter drainage of right pelvic abscess on 05/06/2017. EXAM: CT ABDOMEN AND PELVIS WITH CONTRAST TECHNIQUE: Multidetector CT imaging of the abdomen and pelvis was performed using the standard protocol following bolus administration of intravenous contrast. CONTRAST:  151m ISOVUE-300 IOPAMIDOL (ISOVUE-300) INJECTION 61% COMPARISON:  05/11/2017 FINDINGS: Lower chest: No acute abnormality. Hepatobiliary: No focal liver abnormality is seen. Status post cholecystectomy. No biliary dilatation. Pancreas: Unremarkable. No pancreatic ductal dilatation or surrounding inflammatory changes. Spleen: Normal in size without focal abnormality. Adrenals/Urinary Tract: Adrenal glands are unremarkable. Kidneys are normal, without renal calculi, focal lesion, or hydronephrosis. Bladder is unremarkable. Stomach/Bowel: There remains mild to moderate dilatation of several jejunal loops without overt obstruction. Abscess in the right pelvis has resolved at the level of the indwelling transgluteal percutaneous drain. There are two residual irregular shaped collections of extraluminal loculated air in the pelvis. One lies in the central pelvis and measures approximately 2.3 x 5.3 cm just superior to the uterine fundus. A second collection more posterior and slightly superior to the other collection is in the presacral region and measures approximately 1.7 x 5.7 cm. There also is an additional small locule of extraluminal gas present. The collection just above the uterus contains a very small amount of fluid. These areas of predominantly air did contain fluid on the prior scan dated 03/31. The presence of air would implicate communication with bowel and in this location it is suspected that this  represents a contained small bowel perforation rather than sigmoid diverticulitis. The sigmoid colon does contain diverticula but does not appear inflamed or thickened on the current study. Next to the pockets of air there is a loop of distal small bowel that appears thickened and abnormal in appearance. Focal area of thickening is somewhat rounded and measures nearly 3 cm. Underlying small bowel mass cannot be excluded. Vascular/Lymphatic: No arterial or venous abnormalities are identified. No enlarged lymph nodes are identified. Reproductive: Uterus and bilateral adnexa are unremarkable.  Other: No significant free fluid.  No hernias. Musculoskeletal: No acute or significant osseous findings. IMPRESSION: 1. Resolution of right pelvic abscess at the level of the current indwelling drainage catheter. This drain was injected and also removed today. See separate injection report and clinic note in Epic. 2. Etiology of the right pelvic abscess as well as current loculated pockets of extraluminal air in the pelvis is felt be secondary to localized small bowel perforation rather than sigmoid diverticulitis based on current CT findings, as above. Area of thickened small bowel appears abnormal in the region of loculated air and an underlying small bowel mass cannot be excluded. There is also residual evidence of associated proximal dilatation of small bowel consistent with ileus/partial obstruction. Electronically Signed   By: Aletta Edouard M.D.   On: 05/21/2017 08:23   Dg Sinus/fist Tube Chk-non Gi  Result Date: 05/20/2017 INDICATION: Status post percutaneous catheter drainage of right posterior pelvic abscess on 05/06/2017. EXAM: INJECTION OF ABSCESS DRAINAGE CATHETER UNDER FLUOROSCOPY MEDICATIONS: None ANESTHESIA/SEDATION: None COMPLICATIONS: None immediate. CONTRAST:  10 mL Omnipaque 300 FLUOROSCOPY TIME:  24 seconds.  13.2 mGy. PROCEDURE: The pre-existing right transgluteal drainage catheter was injected with  contrast material under fluoroscopy. Fluoroscopic spot images were saved. The catheter was then cut and removed. FINDINGS: Contrast injection demonstrates no significant residual abscess cavity surrounding the percutaneous drainage catheter. There is no demonstrated fistula to adjacent bowel. IMPRESSION: Resolution of right-sided pelvic abscess with no fistula demonstrated to bowel with injection of the drainage catheter. The drainage catheter was removed following the injection procedure. Electronically Signed   By: Aletta Edouard M.D.   On: 05/20/2017 15:54   Ir Radiologist Eval & Mgmt  Result Date: 05/20/2017 Please refer to notes tab for details about interventional procedure. (Op Note)   Pending Labs Unresulted Labs (From admission, onward)   None      Vitals/Pain Today's Vitals   05/21/17 1405 05/21/17 1407 05/21/17 1534 05/21/17 1744  BP:  102/69 122/79 125/78  Pulse:  (!) 108 (!) 101 (!) 103  Resp:  '18 16 18  ' Temp:  98.2 F (36.8 C)    TempSrc:  Oral    SpO2:  97% 100% 99%  Weight: 158 lb (71.7 kg)     Height: '5\' 5"'  (1.651 m)     PainSc: 0-No pain       Isolation Precautions No active isolations  Medications Medications  piperacillin-tazobactam (ZOSYN) IVPB 3.375 g (has no administration in time range)  0.9 % NaCl with KCl 20 mEq/ L  infusion (has no administration in time range)  acetaminophen (TYLENOL) tablet 650 mg (has no administration in time range)    Or  acetaminophen (TYLENOL) suppository 650 mg (has no administration in time range)  HYDROcodone-acetaminophen (NORCO/VICODIN) 5-325 MG per tablet 1-2 tablet (has no administration in time range)  morphine 2 MG/ML injection 1-3 mg (has no administration in time range)  diphenhydrAMINE (BENADRYL) capsule 25 mg (has no administration in time range)    Or  diphenhydrAMINE (BENADRYL) injection 25 mg (has no administration in time range)  ondansetron (ZOFRAN-ODT) disintegrating tablet 4 mg (has no administration in  time range)    Or  ondansetron (ZOFRAN) injection 4 mg (has no administration in time range)  ALPRAZolam (XANAX) tablet 1 mg (has no administration in time range)  pantoprazole (PROTONIX) EC tablet 40 mg (has no administration in time range)    Mobility walks

## 2017-05-21 NOTE — ED Provider Notes (Signed)
Pt seen by Dr Sheliah HatchKinsinger in the ED.  Plan on admission and further treatment.   Linwood DibblesKnapp, Aking Klabunde, MD 05/21/17 1719

## 2017-05-21 NOTE — ED Triage Notes (Addendum)
Pt reports that she had drain removed yesterday for abscess in right hip and CT scan. Was called today by her PCP to go to ED for abnormal CT scan showing either mass, perforated diverticulitis,or bowel obstruction. Pt denies any pain at this time.

## 2017-05-21 NOTE — H&P (Signed)
ASHAUNA Snow is an 57 y.o. female.   Chief Complaint: CT findings HPI: 57 yo female with history of diverticulitis who was seen at drain clinic and had her drain removed. Her CT scan was reviewed after the event and was concerning for new air filled collections in her pelvis and some changes to her small intestine concerning for mass or infection. She has had some bloating symptoms for the past month, but overall feels 10x better than when she was admitted previously. She has been eating a normal diet and having normal bowel movements. She has 3 days left of antibiotics.  Past Medical History:  Diagnosis Date  . Anemia   . Anxiety   . Asthma   . Depression   . GERD (gastroesophageal reflux disease)   . Hypertension   . Insomnia   . Migraine     Past Surgical History:  Procedure Laterality Date  . CESAREAN SECTION    . CHOLECYSTECTOMY    . IR RADIOLOGIST EVAL & MGMT  05/20/2017  . KNEE ARTHROSCOPY      No family history on file. Social History:  reports that she has never smoked. She has never used smokeless tobacco. She reports that she drinks alcohol. She reports that she does not use drugs.  Allergies: No Known Allergies   (Not in a hospital admission)  Results for orders placed or performed during the hospital encounter of 05/21/17 (from the past 48 hour(s))  Lipase, blood     Status: None   Collection Time: 05/21/17  2:32 PM  Result Value Ref Range   Lipase 28 11 - 51 U/L    Comment: Performed at [facility], [place] [address] [place], [place] [address]  Comprehensive metabolic panel     Status: Abnormal   Collection Time: 05/21/17  2:32 PM  Result Value Ref Range   Sodium 138 135 - 145 mmol/L   Potassium 3.8 3.5 - 5.1 mmol/L   Chloride 100 (L) 101 - 111 mmol/L   CO2 27 22 - 32 mmol/L   Glucose, Bld 133 (H) 65 - 99 mg/dL   BUN 22 (H) 6 - 20 mg/dL   Creatinine, Ser 1.04 (H) 0.44 - 1.00 mg/dL   Calcium 9.1 8.9 - 10.3 mg/dL   Total Protein 7.9  6.5 - 8.1 g/dL   Albumin 3.4 (L) 3.5 - 5.0 g/dL   AST 23 15 - 41 U/L   ALT 22 14 - 54 U/L   Alkaline Phosphatase 161 (H) 38 - 126 U/L   Total Bilirubin 0.3 0.3 - 1.2 mg/dL   GFR calc non Af Amer 58 (L) >60 mL/min   GFR calc Af Amer >60 >60 mL/min    Comment: (NOTE) The eGFR has been calculated using the CKD EPI equation. This calculation has not been validated in all clinical situations. eGFR's persistently <60 mL/min signify possible Chronic Kidney Disease.    Anion gap 11 5 - 15    Comment: Performed at [facility], [place] [address], [place],  [address]  CBC     Status: Abnormal   Collection Time: 05/21/17  2:32 PM  Result Value Ref Range   WBC 10.5 4.0 - 10.5 K/uL   RBC 3.48 (L) 3.87 - 5.11 MIL/uL   Hemoglobin 10.5 (L) 12.0 - 15.0 g/dL   HCT 32.1 (L) 36.0 - 46.0 %   MCV 92.2 78.0 - 100.0 fL   MCH 30.2 26.0 - 34.0 pg   MCHC 32.7 30.0 -  36.0 g/dL   RDW 13.4 11.5 - 15.5 %   Platelets 695 (H) 150 - 400 K/uL    Comment: Performed at [facility], [place] [address] [place], [place] [address]  Urinalysis, Routine w reflex microscopic     Status: Abnormal   Collection Time: 05/21/17  3:34 PM  Result Value Ref Range   Color, Urine YELLOW YELLOW   APPearance CLEAR CLEAR   Specific Gravity, Urine 1.012 1.005 - 1.030   pH 5.0 5.0 - 8.0   Glucose, UA NEGATIVE NEGATIVE mg/dL   Hgb urine dipstick MODERATE (A) NEGATIVE   Bilirubin Urine NEGATIVE NEGATIVE   Ketones, ur NEGATIVE NEGATIVE mg/dL   Protein, ur NEGATIVE NEGATIVE mg/dL   Nitrite NEGATIVE NEGATIVE   Leukocytes, UA NEGATIVE NEGATIVE   RBC / HPF 0-5 0 - 5 RBC/hpf   WBC, UA NONE SEEN 0 - 5 WBC/hpf   Bacteria, UA NONE SEEN NONE SEEN   Squamous Epithelial / LPF NONE SEEN NONE SEEN   Mucus PRESENT     Comment: Performed at [facility], [place] [address] [place],  [address]   Ct Abdomen Pelvis W Contrast  Result Date: 05/21/2017 CLINICAL DATA:  Presumed  diverticulitis and status post percutaneous catheter drainage of right pelvic abscess on 05/06/2017. EXAM: CT ABDOMEN AND PELVIS WITH CONTRAST TECHNIQUE: Multidetector CT imaging of the abdomen and pelvis was performed using the standard protocol following bolus administration of intravenous contrast. CONTRAST:  166m ISOVUE-300 IOPAMIDOL (ISOVUE-300) INJECTION 61% COMPARISON:  05/11/2017 FINDINGS: Lower chest: No acute abnormality. Hepatobiliary: No focal liver abnormality is seen. Status post cholecystectomy. No biliary dilatation. Pancreas: Unremarkable. No pancreatic ductal dilatation or surrounding inflammatory changes. Spleen: Normal in size without focal abnormality. Adrenals/Urinary Tract: Adrenal glands are unremarkable. Kidneys are normal, without renal calculi, focal lesion, or hydronephrosis. Bladder is unremarkable. Stomach/Bowel: There remains mild to moderate dilatation of several jejunal loops without overt obstruction. Abscess in the right pelvis has resolved at the level of the indwelling transgluteal percutaneous drain. There are two residual irregular shaped collections of extraluminal loculated air in the pelvis. One lies in the central pelvis and measures approximately 2.3 x 5.3 cm just superior to the uterine fundus. A second collection more posterior and slightly superior to the other collection is in the presacral region and measures approximately 1.7 x 5.7 cm. There also is an additional small locule of extraluminal gas present. The collection just above the uterus contains a very small amount of fluid. These areas of predominantly air did contain fluid on the prior scan dated 03/31. The presence of air would implicate communication with bowel and in this location it is suspected that this represents a contained small bowel perforation rather than sigmoid diverticulitis. The sigmoid colon does contain diverticula but does not appear inflamed or thickened on the current study. Next to the  pockets of air there is a loop of distal small bowel that appears thickened and abnormal in appearance. Focal area of thickening is somewhat rounded and measures nearly 3 cm. Underlying small bowel mass cannot be excluded. Vascular/Lymphatic: No arterial or venous abnormalities are identified. No enlarged lymph nodes are identified. Reproductive: Uterus and bilateral adnexa are unremarkable. Other: No significant free fluid.  No hernias. Musculoskeletal: No acute or significant osseous findings. IMPRESSION: 1. Resolution of right pelvic abscess at the level of the current indwelling drainage catheter. This drain was injected and also removed today. See separate injection report and clinic note in Epic. 2. Etiology of  the right pelvic abscess as well as current loculated pockets of extraluminal air in the pelvis is felt be secondary to localized small bowel perforation rather than sigmoid diverticulitis based on current CT findings, as above. Area of thickened small bowel appears abnormal in the region of loculated air and an underlying small bowel mass cannot be excluded. There is also residual evidence of associated proximal dilatation of small bowel consistent with ileus/partial obstruction. Electronically Signed   By: Aletta Edouard M.D.   On: 05/21/2017 08:23   Dg Sinus/fist Tube Chk-non Gi  Result Date: 05/20/2017 INDICATION: Status post percutaneous catheter drainage of right posterior pelvic abscess on 05/06/2017. EXAM: INJECTION OF ABSCESS DRAINAGE CATHETER UNDER FLUOROSCOPY MEDICATIONS: None ANESTHESIA/SEDATION: None COMPLICATIONS: None immediate. CONTRAST:  10 mL Omnipaque 300 FLUOROSCOPY TIME:  24 seconds.  13.2 mGy. PROCEDURE: The pre-existing right transgluteal drainage catheter was injected with contrast material under fluoroscopy. Fluoroscopic spot images were saved. The catheter was then cut and removed. FINDINGS: Contrast injection demonstrates no significant residual abscess cavity surrounding  the percutaneous drainage catheter. There is no demonstrated fistula to adjacent bowel. IMPRESSION: Resolution of right-sided pelvic abscess with no fistula demonstrated to bowel with injection of the drainage catheter. The drainage catheter was removed following the injection procedure. Electronically Signed   By: Aletta Edouard M.D.   On: 05/20/2017 15:54   Ir Radiologist Eval & Mgmt  Result Date: 05/20/2017 Please refer to notes tab for details about interventional procedure. (Op Note)   Review of Systems  Constitutional: Negative for chills and fever.  HENT: Negative for hearing loss.   Eyes: Negative for blurred vision and double vision.  Respiratory: Negative for cough and hemoptysis.   Cardiovascular: Negative for chest pain and palpitations.  Gastrointestinal: Negative for abdominal pain, nausea and vomiting.  Genitourinary: Negative for dysuria and urgency.  Musculoskeletal: Negative for myalgias and neck pain.  Skin: Negative for itching and rash.  Neurological: Negative for dizziness, tingling and headaches.  Endo/Heme/Allergies: Does not bruise/bleed easily.  Psychiatric/Behavioral: Negative for depression and suicidal ideas.    Blood pressure 122/79, pulse (!) 101, temperature 98.2 F (36.8 C), temperature source Oral, resp. rate 16, height '5\' 5"'  (1.651 m), weight 71.7 kg (158 lb), SpO2 100 %. Physical Exam  Vitals reviewed. Constitutional: She is oriented to person, place, and time. She appears well-developed and well-nourished.  HENT:  Head: Normocephalic and atraumatic.  Eyes: Pupils are equal, round, and reactive to light. Conjunctivae and EOM are normal.  Neck: Normal range of motion. Neck supple.  Cardiovascular: Normal rate and regular rhythm.  Respiratory: Effort normal and breath sounds normal.  GI: Soft. Bowel sounds are normal. She exhibits no distension. There is no tenderness.  Musculoskeletal: Normal range of motion.  Neurological: She is alert and  oriented to person, place, and time.  Skin: Skin is warm and dry.  Psychiatric: She has a normal mood and affect. Her behavior is normal.     Assessment/Plan 57 yo female with findings of contained air pockets in the pelvis and concern for small bowel pathology. Her exam is benign and she has no leukocytosis. This presentation is odd and we discussed options of prolonged antibiotics or observation or observation and laparoscopy. Due to the rareness of this presentation, we have decided to proceed with observation and plan for laparoscopy tomorrow. -iv abx -pain control -clear liquids tonight -rediscuss surgery plan in the mornign  Mickeal Skinner, MD 05/21/2017, 5:15 PM

## 2017-05-21 NOTE — ED Provider Notes (Signed)
[facility]-EMERGENCY DEPT Provider Note   CSN: [id number] Arrival date & time: 05/21/17  1359     History   Chief Complaint Chief Complaint  Patient presents with  . sent for abnormal CT scan    HPI Kim Snow is a 57 y.o. female.  HPI   Presents with concern for abnormal CT scan Reports she has been feeling well  Went home 4/1 after having diverticulitis with abscess, perforation, had drain placed Drain was removed yesterday and radiology looked at the CT and thought that maybe air present was perforated small bowel instead of diverticulitis, thickened small bowel and cannot rule out underlying small bowel mass, concern for proximal ileus/obstruction Reports she has some mild bloating which is unchanged since before her last hospitalization No nausea/vomiting, is having normal bowel movements, passing flatus, no fevers Reports she does not want to go into the hospital, or even change into gown.    Past Medical History:  Diagnosis Date  . Anemia   . Anxiety   . Asthma   . Depression   . GERD (gastroesophageal reflux disease)   . Hypertension   . Insomnia   . Migraine     Patient Active Problem List   Diagnosis Date Noted  . Diverticulitis of large intestine with perforation and abscess 05/06/2017  . Diverticulitis of colon with perforation 05/05/2017    Past Surgical History:  Procedure Laterality Date  . CESAREAN SECTION    . CHOLECYSTECTOMY    . IR RADIOLOGIST EVAL & MGMT  05/20/2017  . KNEE ARTHROSCOPY       OB History   None      Home Medications    Prior to Admission medications   Medication Sig Start Date End Date Taking? Authorizing Provider  acetaminophen (TYLENOL) 325 MG tablet Take 2 tablets (650 mg total) by mouth every 6 (six) hours as needed for mild pain (or temp > 100). 05/11/17  Yes Berna Bue, MD  ALPRAZolam Prudy Feeler) 1 MG tablet Take 1 mg by mouth at bedtime as needed for sleep.  02/11/17  Yes  [provider]  amoxicillin-clavulanate (AUGMENTIN) 875-125 MG tablet Take 1 tablet by mouth 2 (two) times daily for 14 days. 05/11/17 05/25/17 Yes Berna Bue, MD  CALCIUM PO Take 1,000 mg by mouth daily.   Yes [provider]  Cyanocobalamin (VITAMIN B 12 PO) Take 1 tablet by mouth daily.   Yes [provider]  docusate sodium (COLACE) 100 MG capsule Take 1 capsule (100 mg total) by mouth 2 (two) times daily. 05/11/17  Yes Berna Bue, MD  losartan-hydrochlorothiazide (HYZAAR) 100-25 MG tablet Take 1 tablet by mouth daily. 03/11/17  Yes [provider]  meloxicam (MOBIC) 15 MG tablet Take 15 mg by mouth daily at 12 noon. 04/21/17  Yes [provider]  omeprazole (PRILOSEC) 40 MG capsule Take 40 mg by mouth daily. 03/28/17  Yes [provider]  oxyCODONE (OXY IR/ROXICODONE) 5 MG immediate release tablet Take 1 tablet (5 mg total) by mouth every 6 (six) hours as needed for severe pain or breakthrough pain. 05/11/17  Yes Berna Bue, MD  Polysaccharide Iron Complex (IFEREX 150 PO) Take 150 mg by mouth daily.   Yes [provider]    Family History No family history on file.  Social History Social History   Tobacco Use  . Smoking status: Never Smoker  . Smokeless tobacco: Never Used  Substance Use Topics  . Alcohol use: Yes  Comment: social  . Drug use: Never     Allergies   Patient has no known allergies.   Review of Systems Review of Systems  Constitutional: Negative for fever.  HENT: Negative for sore throat.   Eyes: Negative for visual disturbance.  Respiratory: Negative for cough and shortness of breath.   Cardiovascular: Negative for chest pain.  Gastrointestinal: Positive for abdominal distention (unchanged). Negative for abdominal pain, constipation, diarrhea, nausea and vomiting.  Genitourinary: Negative for difficulty urinating and dysuria.  Musculoskeletal: Negative for back pain and neck  pain.  Skin: Negative for rash.  Neurological: Negative for syncope and headaches.     Physical Exam Updated Vital Signs BP 122/79   Pulse (!) 101   Temp 98.2 F (36.8 C) (Oral)   Resp 16   Ht 5\' 5"  (1.651 m)   Wt 71.7 kg (158 lb)   SpO2 100%   BMI 26.29 kg/m   Physical Exam  Constitutional: She is oriented to person, place, and time. She appears well-developed and well-nourished. No distress.  HENT:  Head: Normocephalic and atraumatic.  Eyes: Conjunctivae and EOM are normal.  Neck: Normal range of motion.  Cardiovascular: Normal rate, regular rhythm, normal heart sounds and intact distal pulses. Exam reveals no gallop and no friction rub.  No murmur heard. Pulmonary/Chest: Effort normal and breath sounds normal. No respiratory distress. She has no wheezes. She has no rales.  Abdominal: Soft. She exhibits no distension. There is no tenderness. There is no guarding (reports mild left sided discomfort that she believes is because sh eneeds to use the bathroom, no other tenderness).  Musculoskeletal: She exhibits no edema or tenderness.  Neurological: She is alert and oriented to person, place, and time.  Skin: Skin is warm and dry. No rash noted. She is not diaphoretic. No erythema.  Nursing note and vitals reviewed.    ED Treatments / Results  Labs (all labs ordered are listed, but only abnormal results are displayed) Labs Reviewed  COMPREHENSIVE METABOLIC PANEL - Abnormal; Notable for the following components:      Result Value   Chloride 100 (*)    Glucose, Bld 133 (*)    BUN 22 (*)    Creatinine, Ser 1.04 (*)    Albumin 3.4 (*)    Alkaline Phosphatase 161 (*)    GFR calc non Af Amer 58 (*)    All other components within normal limits  CBC - Abnormal; Notable for the following components:   RBC 3.48 (*)    Hemoglobin 10.5 (*)    HCT 32.1 (*)    Platelets 695 (*)    All other components within normal limits  URINALYSIS, ROUTINE W REFLEX MICROSCOPIC - Abnormal;  Notable for the following components:   Hgb urine dipstick MODERATE (*)    All other components within normal limits  LIPASE, BLOOD    EKG None  Radiology Ct Abdomen Pelvis W Contrast  Result Date: 05/21/2017 CLINICAL DATA:  Presumed diverticulitis and status post percutaneous catheter drainage of right pelvic abscess on 05/06/2017. EXAM: CT ABDOMEN AND PELVIS WITH CONTRAST TECHNIQUE: Multidetector CT imaging of the abdomen and pelvis was performed using the standard protocol following bolus administration of intravenous contrast. CONTRAST:  ISOVUE-300 IOPAMIDOL (ISOVUE-300) INJECTION 61% COMPARISON:  05/11/2017 FINDINGS: Lower chest: No acute abnormality. Hepatobiliary: No focal liver abnormality is seen. Status post cholecystectomy. No biliary dilatation. Pancreas: Unremarkable. No pancreatic ductal dilatation or surrounding inflammatory changes. Spleen: Normal in size without focal abnormality. Adrenals/Urinary Tract: Adrenal  glands are unremarkable. Kidneys are normal, without renal calculi, focal lesion, or hydronephrosis. Bladder is unremarkable. Stomach/Bowel: There remains mild to moderate dilatation of several jejunal loops without overt obstruction. Abscess in the right pelvis has resolved at the level of the indwelling transgluteal percutaneous drain. There are two residual irregular shaped collections of extraluminal loculated air in the pelvis. One lies in the central pelvis and measures approximately 2.3 x 5.3 cm just superior to the uterine fundus. A second collection more posterior and slightly superior to the other collection is in the presacral region and measures approximately 1.7 x 5.7 cm. There also is an additional small locule of extraluminal gas present. The collection just above the uterus contains a very small amount of fluid. These areas of predominantly air did contain fluid on the prior scan dated 03/31. The presence of air would implicate communication with bowel and  in this location it is suspected that this represents a contained small bowel perforation rather than sigmoid diverticulitis. The sigmoid colon does contain diverticula but does not appear inflamed or thickened on the current study. Next to the pockets of air there is a loop of distal small bowel that appears thickened and abnormal in appearance. Focal area of thickening is somewhat rounded and measures nearly 3 cm. Underlying small bowel mass cannot be excluded. Vascular/Lymphatic: No arterial or venous abnormalities are identified. No enlarged lymph nodes are identified. Reproductive: Uterus and bilateral adnexa are unremarkable. Other: No significant free fluid.  No hernias. Musculoskeletal: No acute or significant osseous findings. IMPRESSION: 1. Resolution of right pelvic abscess at the level of the current indwelling drainage catheter. This drain was injected and also removed today. See separate injection report and clinic note in Epic. 2. Etiology of the right pelvic abscess as well as current loculated pockets of extraluminal air in the pelvis is felt be secondary to localized small bowel perforation rather than sigmoid diverticulitis based on current CT findings, as above. Area of thickened small bowel appears abnormal in the region of loculated air and an underlying small bowel mass cannot be excluded. There is also residual evidence of associated proximal dilatation of small bowel consistent with ileus/partial obstruction. Electronically Signed   By: Irish Lack M.D.   On: 05/21/2017 08:23   Dg Sinus/fist Tube Chk-non Gi  Result Date: 05/20/2017 INDICATION: Status post percutaneous catheter drainage of right posterior pelvic abscess on 05/06/2017. EXAM: INJECTION OF ABSCESS DRAINAGE CATHETER UNDER FLUOROSCOPY MEDICATIONS: None ANESTHESIA/SEDATION: None COMPLICATIONS: None immediate. CONTRAST:  10 mL Omnipaque 300 FLUOROSCOPY TIME:  24 seconds.  13.2 mGy. PROCEDURE: The pre-existing right  transgluteal drainage catheter was injected with contrast material under fluoroscopy. Fluoroscopic spot images were saved. The catheter was then cut and removed. FINDINGS: Contrast injection demonstrates no significant residual abscess cavity surrounding the percutaneous drainage catheter. There is no demonstrated fistula to adjacent bowel. IMPRESSION: Resolution of right-sided pelvic abscess with no fistula demonstrated to bowel with injection of the drainage catheter. The drainage catheter was removed following the injection procedure. Electronically Signed   By: Irish Lack M.D.   On: 05/20/2017 15:54   Ir Radiologist Eval & Mgmt  Result Date: 05/20/2017 Please refer to notes tab for details about interventional procedure. (Op Note)   Procedures Procedures (including critical care time)  Medications Ordered in ED Medications - No data to display   Initial Impression / Assessment and Plan / ED Course  I have reviewed the triage vital signs and the nursing notes.  Pertinent labs & imaging  results that were available during my care of the patient were reviewed by me and considered in my medical decision making (see chart for details).     57 year old female with history above, including recent admission with concern for suspected perforated diverticulitis and pelvic abscess with drain improvement and removal by IR yesterday, who presents with concern for abnormal CT scan.  Interventional radiology had done a repeat CT scan which showed resolution of the right pelvic abscess, and the drain was removed, however CT had showed residual pockets of air in the pelvis with an adjacent abnormal small bowel loop and persistent small bowel ileus or possible partial SBO, cannot exclude SB tumor.  Clinically, patient is well-appearing, asymptomatic, tolerating p.o., passing flatus, having normal bowel movements, and having no nausea, vomiting or fevers.  She is well-appearing, with lab work that shows no  leukocytosis, Cr mildly elevated from discharge but improved from prior values.  I doubt clinically she has new perforation or SBO, however will have Surgery evaluate given abnormal CT.  Consulted General Surgery who will provide recommendations and signed out to Dr. Lynelle DoctorKnapp.        Final Clinical Impressions(s) / ED Diagnoses   Final diagnoses:  Abnormal CT scan, small bowel    ED Discharge Orders    None       Alvira MondaySchlossman, Ramell Wacha, MD 05/21/17 1659

## 2017-05-22 ENCOUNTER — Observation Stay (HOSPITAL_COMMUNITY): Payer: BC Managed Care – PPO | Admitting: Anesthesiology

## 2017-05-22 ENCOUNTER — Encounter (HOSPITAL_COMMUNITY): Payer: Self-pay | Admitting: Emergency Medicine

## 2017-05-22 ENCOUNTER — Encounter (HOSPITAL_COMMUNITY): Admission: EM | Disposition: A | Discharge status: Home Health | Payer: Self-pay | Source: Home / Self Care

## 2017-05-22 HISTORY — PX: LAPAROSCOPY: SHX197

## 2017-05-22 LAB — CBC WITH DIFFERENTIAL/PLATELET
Basophils Absolute: 0.1 10*3/uL (ref 0.0–0.1)
Basophils Relative: 1 %
EOS PCT: 3 %
Eosinophils Absolute: 0.1 10*3/uL (ref 0.0–0.7)
HEMATOCRIT: 30.3 % — AB (ref 36.0–46.0)
Hemoglobin: 9.7 g/dL — ABNORMAL LOW (ref 12.0–15.0)
LYMPHS ABS: 1.4 10*3/uL (ref 0.7–4.0)
LYMPHS PCT: 26 %
MCH: 29.8 pg (ref 26.0–34.0)
MCHC: 32 g/dL (ref 30.0–36.0)
MCV: 93.2 fL (ref 78.0–100.0)
Monocytes Absolute: 0.5 10*3/uL (ref 0.1–1.0)
Monocytes Relative: 9 %
NEUTROS ABS: 3.4 10*3/uL (ref 1.7–7.7)
Neutrophils Relative %: 61 %
PLATELETS: 550 10*3/uL — AB (ref 150–400)
RBC: 3.25 MIL/uL — AB (ref 3.87–5.11)
RDW: 13.4 % (ref 11.5–15.5)
WBC: 5.5 10*3/uL (ref 4.0–10.5)

## 2017-05-22 LAB — TYPE AND SCREEN
ABO/RH(D): O POS
Antibody Screen: NEGATIVE

## 2017-05-22 LAB — BASIC METABOLIC PANEL
ANION GAP: 10 (ref 5–15)
BUN: 14 mg/dL (ref 6–20)
CO2: 27 mmol/L (ref 22–32)
Calcium: 8.8 mg/dL — ABNORMAL LOW (ref 8.9–10.3)
Chloride: 103 mmol/L (ref 101–111)
Creatinine, Ser: 0.87 mg/dL (ref 0.44–1.00)
GFR calc Af Amer: >60 mL/min (ref >60)
GLUCOSE: 104 mg/dL — AB (ref 65–99)
Potassium: 3.7 mmol/L (ref 3.5–5.1)
Sodium: 140 mmol/L (ref 135–145)

## 2017-05-22 LAB — ABO/RH: ABO/RH(D): O POS

## 2017-05-22 LAB — SURGICAL PCR SCREEN
MRSA, PCR: NEGATIVE
STAPHYLOCOCCUS AUREUS: NEGATIVE

## 2017-05-22 SURGERY — LAPAROSCOPY, DIAGNOSTIC
Anesthesia: General | Site: Abdomen

## 2017-05-22 MED ORDER — LACTATED RINGERS IV SOLN
INTRAVENOUS | Status: DC
  Administered 2017-05-22 (×2): via INTRAVENOUS

## 2017-05-22 MED ORDER — MIDAZOLAM HCL 5 MG/5ML IJ SOLN
INTRAMUSCULAR | Status: DC | PRN
  Administered 2017-05-22: 2 mg via INTRAVENOUS

## 2017-05-22 MED ORDER — SUGAMMADEX SODIUM 200 MG/2ML IV SOLN
INTRAVENOUS | Status: DC | PRN
  Administered 2017-05-22: 200 mg via INTRAVENOUS

## 2017-05-22 MED ORDER — FENTANYL CITRATE (PF) 100 MCG/2ML IJ SOLN
INTRAMUSCULAR | Status: DC | PRN
  Administered 2017-05-22: 100 ug via INTRAVENOUS

## 2017-05-22 MED ORDER — LIDOCAINE HCL (CARDIAC) 20 MG/ML IV SOLN
INTRAVENOUS | Status: DC | PRN
  Administered 2017-05-22: 50 mg via INTRAVENOUS

## 2017-05-22 MED ORDER — SUGAMMADEX SODIUM 200 MG/2ML IV SOLN
INTRAVENOUS | Status: AC
  Filled 2017-05-22: qty 2

## 2017-05-22 MED ORDER — PROPOFOL 10 MG/ML IV BOLUS
INTRAVENOUS | Status: DC | PRN
  Administered 2017-05-22: 120 mg via INTRAVENOUS

## 2017-05-22 MED ORDER — MIDAZOLAM HCL 2 MG/2ML IJ SOLN
INTRAMUSCULAR | Status: AC
  Filled 2017-05-22: qty 2

## 2017-05-22 MED ORDER — SCOPOLAMINE 1 MG/3DAYS TD PT72
1.0000 | MEDICATED_PATCH | TRANSDERMAL | Status: DC
  Administered 2017-05-22: 1.5 mg via TRANSDERMAL
  Filled 2017-05-22: qty 1

## 2017-05-22 MED ORDER — BUPIVACAINE-EPINEPHRINE (PF) 0.5% -1:200000 IJ SOLN
INTRAMUSCULAR | Status: DC | PRN
  Administered 2017-05-22: 30 mL

## 2017-05-22 MED ORDER — LACTATED RINGERS IR SOLN
Status: DC | PRN
  Administered 2017-05-22: 1000 mL

## 2017-05-22 MED ORDER — 0.9 % SODIUM CHLORIDE (POUR BTL) OPTIME
TOPICAL | Status: DC | PRN
  Administered 2017-05-22: 1000 mL

## 2017-05-22 MED ORDER — MEPERIDINE HCL 50 MG/ML IJ SOLN: 6.2500 mg | INTRAMUSCULAR | Status: DC | PRN

## 2017-05-22 MED ORDER — EPHEDRINE 5 MG/ML INJ
INTRAVENOUS | Status: AC
  Filled 2017-05-22: qty 10

## 2017-05-22 MED ORDER — ROCURONIUM BROMIDE 10 MG/ML (PF) SYRINGE
PREFILLED_SYRINGE | INTRAVENOUS | Status: AC
  Filled 2017-05-22: qty 5

## 2017-05-22 MED ORDER — DEXAMETHASONE SODIUM PHOSPHATE 10 MG/ML IJ SOLN
INTRAMUSCULAR | Status: AC
  Filled 2017-05-22: qty 1

## 2017-05-22 MED ORDER — ROCURONIUM BROMIDE 100 MG/10ML IV SOLN
INTRAVENOUS | Status: DC | PRN
  Administered 2017-05-22 (×4): 10 mg via INTRAVENOUS
  Administered 2017-05-22: 30 mg via INTRAVENOUS

## 2017-05-22 MED ORDER — SUCCINYLCHOLINE CHLORIDE 20 MG/ML IJ SOLN
INTRAMUSCULAR | Status: DC | PRN
  Administered 2017-05-22: 100 mg via INTRAVENOUS

## 2017-05-22 MED ORDER — SUCCINYLCHOLINE CHLORIDE 200 MG/10ML IV SOSY
PREFILLED_SYRINGE | INTRAVENOUS | Status: AC
  Filled 2017-05-22: qty 10

## 2017-05-22 MED ORDER — ONDANSETRON HCL 4 MG/2ML IJ SOLN
INTRAMUSCULAR | Status: AC
  Filled 2017-05-22: qty 2

## 2017-05-22 MED ORDER — PROPOFOL 10 MG/ML IV BOLUS
INTRAVENOUS | Status: AC
  Filled 2017-05-22: qty 20

## 2017-05-22 MED ORDER — HYDROMORPHONE HCL 1 MG/ML IJ SOLN
0.2500 mg | INTRAMUSCULAR | Status: DC | PRN
  Administered 2017-05-22 (×3): 0.5 mg via INTRAVENOUS

## 2017-05-22 MED ORDER — METOCLOPRAMIDE HCL 5 MG/ML IJ SOLN: 10.0000 mg | Freq: Once | INTRAMUSCULAR | Status: DC | PRN

## 2017-05-22 MED ORDER — ONDANSETRON HCL 4 MG/2ML IJ SOLN
INTRAMUSCULAR | Status: DC | PRN
  Administered 2017-05-22: 4 mg via INTRAVENOUS

## 2017-05-22 MED ORDER — LIDOCAINE 2% (20 MG/ML) 5 ML SYRINGE
INTRAMUSCULAR | Status: AC
  Filled 2017-05-22: qty 5

## 2017-05-22 MED ORDER — EPHEDRINE SULFATE 50 MG/ML IJ SOLN
INTRAMUSCULAR | Status: DC | PRN
  Administered 2017-05-22: 10 mg via INTRAVENOUS

## 2017-05-22 MED ORDER — DEXAMETHASONE SODIUM PHOSPHATE 10 MG/ML IJ SOLN
INTRAMUSCULAR | Status: DC | PRN
  Administered 2017-05-22: 10 mg via INTRAVENOUS

## 2017-05-22 MED ORDER — HYDROMORPHONE HCL 1 MG/ML IJ SOLN
INTRAMUSCULAR | Status: AC
  Administered 2017-05-22: 0.5 mg via INTRAVENOUS
  Filled 2017-05-22: qty 2

## 2017-05-22 MED ORDER — FENTANYL CITRATE (PF) 250 MCG/5ML IJ SOLN
INTRAMUSCULAR | Status: AC
Start: 2017-05-22 — End: 2017-05-22
  Filled 2017-05-22: qty 5

## 2017-05-22 MED ORDER — BUPIVACAINE-EPINEPHRINE (PF) 0.5% -1:200000 IJ SOLN
INTRAMUSCULAR | Status: AC
  Filled 2017-05-22: qty 30

## 2017-05-22 SURGICAL SUPPLY — 59 items
ADH SKN CLS APL DERMABOND .7 (GAUZE/BANDAGES/DRESSINGS)
APL SKNCLS STERI-STRIP NONHPOA (GAUZE/BANDAGES/DRESSINGS) ×1
BANDAGE ADH SHEER 1  50/CT (GAUZE/BANDAGES/DRESSINGS) ×2 IMPLANT
BENZOIN TINCTURE PRP APPL 2/3 (GAUZE/BANDAGES/DRESSINGS) ×2 IMPLANT
BLADE 10 SAFETY STRL DISP (BLADE) ×2 IMPLANT
CABLE HIGH FREQUENCY MONO STRZ (ELECTRODE) ×2 IMPLANT
CLOSURE WOUND 1/2 X4 (GAUZE/BANDAGES/DRESSINGS) ×1
COVER SURGICAL LIGHT HANDLE (MISCELLANEOUS) ×3 IMPLANT
DECANTER SPIKE VIAL GLASS SM (MISCELLANEOUS) IMPLANT
DERMABOND ADVANCED (GAUZE/BANDAGES/DRESSINGS)
DERMABOND ADVANCED .7 DNX12 (GAUZE/BANDAGES/DRESSINGS) IMPLANT
DRAIN CHANNEL 19F RND (DRAIN) ×2 IMPLANT
DRAPE WARM FLUID 44X44 (DRAPE) ×3 IMPLANT
DRSG VAC ATS MED SENSATRAC (GAUZE/BANDAGES/DRESSINGS) ×2 IMPLANT
ELECT CAUTERY BLADE 6.4 (BLADE) ×2 IMPLANT
ELECT REM PT RETURN 15FT ADLT (MISCELLANEOUS) ×3 IMPLANT
EVACUATOR SILICONE 100CC (DRAIN) ×2 IMPLANT
GLOVE BIOGEL PI IND STRL 7.0 (GLOVE) ×1 IMPLANT
GLOVE BIOGEL PI INDICATOR 7.0 (GLOVE) ×8
GLOVE SURG SS PI 7.0 STRL IVOR (GLOVE) ×10 IMPLANT
GOWN STRL REUS W/TWL LRG LVL3 (GOWN DISPOSABLE) ×7 IMPLANT
GOWN STRL REUS W/TWL XL LVL3 (GOWN DISPOSABLE) ×4 IMPLANT
IRRIG SUCT STRYKERFLOW 2 WTIP (MISCELLANEOUS)
IRRIGATION SUCT STRKRFLW 2 WTP (MISCELLANEOUS) IMPLANT
KIT BASIN OR (CUSTOM PROCEDURE TRAY) ×3 IMPLANT
LIGASURE IMPACT 36 18CM CVD LR (INSTRUMENTS) ×2 IMPLANT
PENCIL HANDSWITCHING (ELECTRODE) ×2 IMPLANT
PORT LAP GEL ALEXIS MED 5-9CM (MISCELLANEOUS) ×2 IMPLANT
RELOAD AUTO 90-3.5 TA90 BLE (ENDOMECHANICALS) ×3 IMPLANT
RELOAD PROXIMATE 75MM BLUE (ENDOMECHANICALS) ×15 IMPLANT
RELOAD STAPLE 75 3.8 BLU REG (ENDOMECHANICALS) IMPLANT
RELOAD STAPLE 90 BLU REG (ENDOMECHANICALS) IMPLANT
SHEARS HARMONIC ACE PLUS 36CM (ENDOMECHANICALS) IMPLANT
SLEEVE XCEL OPT CAN 5 100 (ENDOMECHANICALS) ×7 IMPLANT
SPONGE DRAIN TRACH 4X4 STRL 2S (GAUZE/BANDAGES/DRESSINGS) ×2 IMPLANT
STAPLER 90 3.5 STAND SLIM (STAPLE) ×3
STAPLER 90 3.5 STD SLIM (STAPLE) IMPLANT
STAPLER PROXIMATE 75MM BLUE (STAPLE) ×2 IMPLANT
STRIP CLOSURE SKIN 1/2X4 (GAUZE/BANDAGES/DRESSINGS) ×1 IMPLANT
SUT ETHILON 2 0 PS N (SUTURE) ×2 IMPLANT
SUT MNCRL AB 4-0 PS2 18 (SUTURE) ×3 IMPLANT
SUT PDS AB 0 CT1 36 (SUTURE) ×4 IMPLANT
SUT SILK 2 0 (SUTURE) ×3
SUT SILK 2 0 SH CR/8 (SUTURE) ×2 IMPLANT
SUT SILK 2-0 18XBRD TIE 12 (SUTURE) IMPLANT
SUT SILK 2-0 30XBRD TIE 12 (SUTURE) IMPLANT
SUT SILK 3 0 (SUTURE) ×3
SUT SILK 3 0 SH CR/8 (SUTURE) ×4 IMPLANT
SUT SILK 3-0 18XBRD TIE 12 (SUTURE) IMPLANT
SUT VIC AB 3-0 SH 27 (SUTURE) ×3
SUT VIC AB 3-0 SH 27XBRD (SUTURE) IMPLANT
SYR BULB IRRIGATION 50ML (SYRINGE) ×2 IMPLANT
TOWEL OR 17X26 10 PK STRL BLUE (TOWEL DISPOSABLE) ×5 IMPLANT
TOWEL OR NON WOVEN STRL DISP B (DISPOSABLE) ×3 IMPLANT
TRAY FOLEY CATH 14FR (SET/KITS/TRAYS/PACK) ×2 IMPLANT
TRAY LAPAROSCOPIC (CUSTOM PROCEDURE TRAY) ×3 IMPLANT
TROCAR BLADELESS OPT 5 100 (ENDOMECHANICALS) ×3 IMPLANT
TROCAR XCEL 12X100 BLDLESS (ENDOMECHANICALS) IMPLANT
YANKAUER SUCT BULB TIP 10FT TU (MISCELLANEOUS) ×2 IMPLANT

## 2017-05-22 NOTE — Transfer of Care (Signed)
Immediate Anesthesia Transfer of Care Note  Patient: Kim Snow  Procedure(s) Performed: LAPAROSCOPY DIAGNOSTIC (N/A )  Patient Location: PACU  Anesthesia Type:General  Level of Consciousness: awake, alert  and oriented  Airway & Oxygen Therapy: Patient Spontanous Breathing and Patient connected to face mask oxygen  Post-op Assessment: Report given to RN and Post -op Vital signs reviewed and stable  Post vital signs: Reviewed and stable  Last Vitals:  Vitals Value Taken Time  BP    Temp    Pulse 85 05/22/2017  4:21 PM  Resp 19 05/22/2017  4:21 PM  SpO2 100 % 05/22/2017  4:21 PM  Vitals shown include unvalidated device data.  Last Pain:  Vitals:   05/22/17 1206  TempSrc: Oral  PainSc: 8       Patients Stated Pain Goal: 4 (05/22/17 1206)  Complications: No apparent anesthesia complications

## 2017-05-22 NOTE — Anesthesia Procedure Notes (Signed)
Procedure Name: Intubation Date/Time: 05/22/2017 1:14 PM Performed by: Thornell MuleStubblefield, Yaris Ferrell G, CRNA Pre-anesthesia Checklist: Patient identified, Emergency Drugs available, Suction available and Patient being monitored Patient Re-evaluated:Patient Re-evaluated prior to induction Oxygen Delivery Method: Circle system utilized Preoxygenation: Pre-oxygenation with 100% oxygen Induction Type: IV induction Ventilation: Mask ventilation without difficulty Laryngoscope Size: Miller and 3 Grade View: Grade II Tube type: Oral Tube size: 7.0 mm Number of attempts: 1 Airway Equipment and Method: Stylet and Oral airway Placement Confirmation: ETT inserted through vocal cords under direct vision,  positive ETCO2 and breath sounds checked- equal and bilateral Secured at: 21 cm Tube secured with: Tape Dental Injury: Teeth and Oropharynx as per pre-operative assessment

## 2017-05-22 NOTE — Op Note (Signed)
Preoperative diagnosis: pneumoperitoneum  Postoperative diagnosis: pelvic abscess, small intestine perforation  Procedure: laparoscopic lysis of adhesions, small bowel resection with anastomosis x2, 30cm^2 placement of negative pressure wound dressing, rigid proctoscopy  Surgeon: Kim Snow, M.D.  Asst: Estelle GrumblesSteve Gross, M.D., Angelena Formhris White, M.D., Mattie MarlinJessica Focht, [facility]  Anesthesia: general  Indications for procedure: Kim FinnChristine G Snow is a 57 y.o. year old female with symptoms of abdominal pain was treated with antibiotics and drain placement. At follow up imaging there was increased concern for new fluid pockets with air and small intestine changes concerning for mass or inflammatory changes.  Description of procedure: The patient was brought into the operative suite. Anesthesia was administered with General endotracheal anesthesia. WHO checklist was applied. The patient was then placed in supine position. The area was prepped and draped in the usual sterile fashion.  Next, a small incision was made in the right subcostal area and a 5 mm optical entry trocar was used to gain entrance to the peritoneal cavity.  Pneumoperitoneum was applied with a high flow low pressure.  Upon initial assessment of the abdomen.  There were multiple adhesions of the small intestine down to the pelvis and uterus.  4 additional trochars were placed to in the right abdomen and 2 in the left abdomen.  Peritoneal TAP block was placed with Marcaine. Sharp dissection was used to free the omentum from the uterus adhesions.  Blunt and sharp dissection was used to free the small bowel areas from the interloop adhesions and adhesions to the sigmoid colon mesentery and uterus.  In doing this there is a pus pocket that was entered in the pelvis as well as identification of an air-filled pocket consistent with CT finding.  Lysis of adhesion portion of the case took approximately 90 minutes. once the loops of intestine were completely  free, the entirety of the small intestine was run from ligament of Treitz to the terminal ileum.  The areas that were involved in the adhesions had multiple serosal tears and at least one full-thickness hole.  On examining the colon, there do not appear to be a focal area of serosal inflammation consistent with diverticulitis.  Due to the level of serosal injury as well as the full-thickness hole of the small intestine incision made to make a Pfannenstiel for extraction and reexamination of the small intestine.    Therefore, a transverse incision was made. Cautery was used to dissect down to this obtains tissues.  The fascia was transversely incised and flaps were raised of the anterior rectus sheath, the rectus muscle was divided longitudinally, peritoneum was divided into the peritoneal cavity.  A wound protector was placed.  Small bowel was brought through the incision and run identification of the areas involved with the inflammatory process showed 2 small full-thickness holes.  There are also one additional area of large serosal damage and multiple small areas of serosal damage.  Decision was made to resect 2 areas of small intestine.  One area was approximately 18 inches in size.  This was done with 2 75 mm GIA blue load staplers.  75 mm GIA blue load stapler was used for the anastomosis.  A 90 TA blue load stapler was used to close the enterotomy defect.  3-0 silk suture was used to close the mesenteric defect in interrupted fashion and a 3-0 crotch stitch was placed.  And then 24 inches distal to that was a 4 inch area that required removal. This was done with 2 75 mm GIA blue  load staplers.  75 mm GIA blue load stapler was used for the anastomosis.  A 90 TA blue load stapler was used to close the enterotomy defect.  3-0 silk suture was used to close the mesenteric defect in interrupted fashion and a 3-0 crotch stitch was placed.  The small intestine was then placed back into the abdomen.  Contents of the  abdomen were reexamined laparoscopically.  Still concern for colon issue therefore Dr. Cliffton Asters went below and performed a rigid proctoscopy.  There appeared to be no air leaks consistent with perforation.  Therefore drain was placed in the pelvis and brought out through the right lower trocar site.  Pelvis and area were irrigated with saline.  The pnemoperitoneum was removed.  Peritoneum was closed with 3-0 Vicryl in running fashion.  Anterior rectus sheath was closed with a 0 PDS in a running fashion.  Skin was left open due to contamination of the case with active infection.  And a black sponge vac was placed.  Wound appeared to be 10 cm x 3 cm.  Findings: Multiple adhesions of the small intestine to the pelvis and uterus, multiple abscesses identified, full-thickness defect of small intestine  Specimen: Small intestine, ileum  Implant: Sponge wound VAC, 19 French Blake drain  Blood loss: 100 mL  Local anesthesia: 30 ml marcaine   Complications: none  Kim Snow, M.D. General, Bariatric, & Minimally Invasive Surgery [facility]

## 2017-05-22 NOTE — Progress Notes (Signed)
  Progress Note: General Surgery Service   Assessment/Plan: Patient Active Problem List   Diagnosis Date Noted  . Diverticulitis large intestine 05/21/2017  . Diverticulitis of large intestine with perforation and abscess 05/06/2017  . Diverticulitis of colon with perforation 05/05/2017   s/p Procedure(s): LAPAROSCOPY DIAGNOSTIC 05/22/2017 -no new pain -discussed procedure again that we would look at small intestine for infectious or tumor concern, look at these pockets of infectious concern, with possibilities of drain placement, small bowel resection, open procedure. We discussed risks of infection, bleeding, leak. She showed understanding and agreed to proceed.    LOS: 0 days  Chief Complaint/Subjective: No new pains, no nausea, +BM overnight  Objective: Vital signs in last 24 hours: Temp:  [97.8 F (36.6 C)-99.4 F (37.4 C)] 97.8 F (36.6 C) (04/11 0557) Pulse Rate:  [87-108] 95 (04/11 0557) Resp:  [13-18] 13 (04/11 0557) BP: (96-125)/(50-82) 115/78 (04/11 0557) SpO2:  [97 %-100 %] 97 % (04/11 0557) Weight:  [71.7 kg (158 lb)] 71.7 kg (158 lb) (04/10 1405) Last BM Date: 05/21/17  Intake/Output from previous day: 04/10 0701 - 04/11 0700 In: 1038.3 [P.O.:60; I.V.:878.3; IV Piggyback:100] Out: -  Intake/Output this shift: No intake/output data recorded.  Lungs: CTAB  Cardiovascular: RRR  Abd: soft, NT, ND  Extremities: no edema  Neuro: AOx4  Lab Results: CBC  Recent Labs    05/21/17 1432 05/22/17 0745  WBC 10.5 5.5  HGB 10.5* 9.7*  HCT 32.1* 30.3*  PLT 695* 550*   BMET Recent Labs    05/21/17 1432 05/22/17 0745  NA 138 140  K 3.8 3.7  CL 100* 103  CO2 27 27  GLUCOSE 133* 104*  BUN 22* 14  CREATININE 1.04* 0.87  CALCIUM 9.1 8.8*   PT/INR No results for input(s): LABPROT, INR in the last 72 hours. ABG No results for input(s): PHART, HCO3 in the last 72 hours.  Invalid input(s): PCO2,  PO2  Studies/Results:  Anti-infectives: Anti-infectives (From admission, onward)   Start     Dose/Rate Route Frequency Ordered Stop   05/21/17 1815  piperacillin-tazobactam (ZOSYN) IVPB 3.375 g     3.375 g 12.5 mL/hr over 240 Minutes Intravenous Every 8 hours 05/21/17 1737        Medications: Scheduled Meds: . pantoprazole  40 mg Oral Daily   Continuous Infusions: . 0.9 % NaCl with KCl 20 mEq / L 100 mL/hr at 05/22/17 0720  . piperacillin-tazobactam (ZOSYN)  IV Stopped (05/22/17 0640)   PRN Meds:.acetaminophen **OR** acetaminophen, ALPRAZolam, diphenhydrAMINE **OR** diphenhydrAMINE, HYDROcodone-acetaminophen, morphine injection, ondansetron **OR** ondansetron (ZOFRAN) IV  Rodman PickleLuke Aaron Kinsinger, MD Pg# [phone] [facility]

## 2017-05-22 NOTE — Anesthesia Postprocedure Evaluation (Signed)
Anesthesia Post Note  Patient: Kim Snow  Procedure(s) Performed: LAPAROSCOPY DIAGNOSTIC, LYSIS OF ADHESIONS, DRAINAGE OF ABCESS, RIGID PROCTOSCOPY, SMALL BOWEL RESECTION X2 (N/A Abdomen)     Patient location during evaluation: PACU Anesthesia Type: General Level of consciousness: awake and alert and oriented Pain management: pain level controlled Vital Signs Assessment: post-procedure vital signs reviewed and stable Respiratory status: spontaneous breathing, nonlabored ventilation, respiratory function stable and patient connected to nasal cannula oxygen Cardiovascular status: blood pressure returned to baseline and stable Postop Assessment: no apparent nausea or vomiting Anesthetic complications: no    Last Vitals:  Vitals:   05/22/17 1700 05/22/17 1715  BP: 123/76 113/61  Pulse: 86 89  Resp: 16 12  Temp:  36.8 C  SpO2: 100% 100%    Last Pain:  Vitals:   05/22/17 1715  TempSrc:   PainSc: 5                  Kealy Lewter A.

## 2017-05-22 NOTE — Anesthesia Preprocedure Evaluation (Addendum)
Anesthesia Evaluation  Patient identified by MRN, date of birth, ID band Patient awake    Reviewed: Allergy & Precautions, NPO status , Patient's Chart, lab work & pertinent test results  Airway Mallampati: II  TM Distance: >3 FB Neck ROM: Full    Dental  (+) Teeth Intact, Caps   Pulmonary asthma ,    Pulmonary exam normal breath sounds clear to auscultation       Cardiovascular hypertension, Pt. on medications Normal cardiovascular exam Rhythm:Regular Rate:Normal  EKG - sinus tachycardia, LBBB pattern   Neuro/Psych  Headaches, PSYCHIATRIC DISORDERS Anxiety Depression    GI/Hepatic Neg liver ROS, GERD  Medicated and Controlled,Perforated viscus most likely colon diverticulum with diverticulitis S/P percutaneous CT-guided drainage of right pelvic abscess on 05/09/2017   Endo/Other  Hyperlipidemia  Renal/GU negative Renal ROS  negative genitourinary   Musculoskeletal negative musculoskeletal ROS (+)   Abdominal (+)  Abdomen: tender.    Peds  Hematology  (+) anemia ,   Anesthesia Other Findings   Reproductive/Obstetrics                            Anesthesia Physical Anesthesia Plan  ASA: II  Anesthesia Plan: General   Post-op Pain Management:    Induction: Intravenous, Cricoid pressure planned and Rapid sequence  PONV Risk Score and Plan: 4 or greater and Scopolamine patch - Pre-op, Midazolam, Dexamethasone, Ondansetron and Treatment may vary due to age or medical condition  Airway Management Planned: Oral ETT  Additional Equipment:   Intra-op Plan:   Post-operative Plan: Extubation in OR  Informed Consent: I have reviewed the patients History and Physical, chart, labs and discussed the procedure including the risks, benefits and alternatives for the proposed anesthesia with the patient or authorized representative who has indicated his/her understanding and acceptance.   Dental  advisory given  Plan Discussed with: CRNA, Anesthesiologist and Surgeon  Anesthesia Plan Comments:         Anesthesia Quick Evaluation

## 2017-05-23 ENCOUNTER — Encounter (HOSPITAL_COMMUNITY): Payer: Self-pay | Admitting: General Surgery

## 2017-05-23 DIAGNOSIS — J45909 Unspecified asthma, uncomplicated: Secondary | ICD-10-CM | POA: Diagnosis present

## 2017-05-23 DIAGNOSIS — Z791 Long term (current) use of non-steroidal anti-inflammatories (NSAID): Secondary | ICD-10-CM | POA: Diagnosis not present

## 2017-05-23 DIAGNOSIS — K668 Other specified disorders of peritoneum: Secondary | ICD-10-CM | POA: Diagnosis present

## 2017-05-23 DIAGNOSIS — E785 Hyperlipidemia, unspecified: Secondary | ICD-10-CM | POA: Diagnosis present

## 2017-05-23 DIAGNOSIS — Z79899 Other long term (current) drug therapy: Secondary | ICD-10-CM | POA: Diagnosis not present

## 2017-05-23 DIAGNOSIS — K5720 Diverticulitis of large intestine with perforation and abscess without bleeding: Secondary | ICD-10-CM | POA: Diagnosis present

## 2017-05-23 DIAGNOSIS — K219 Gastro-esophageal reflux disease without esophagitis: Secondary | ICD-10-CM | POA: Diagnosis present

## 2017-05-23 DIAGNOSIS — F419 Anxiety disorder, unspecified: Secondary | ICD-10-CM | POA: Diagnosis present

## 2017-05-23 DIAGNOSIS — I10 Essential (primary) hypertension: Secondary | ICD-10-CM | POA: Diagnosis present

## 2017-05-23 DIAGNOSIS — D649 Anemia, unspecified: Secondary | ICD-10-CM | POA: Diagnosis present

## 2017-05-23 DIAGNOSIS — R933 Abnormal findings on diagnostic imaging of other parts of digestive tract: Secondary | ICD-10-CM | POA: Diagnosis present

## 2017-05-23 LAB — BASIC METABOLIC PANEL
Anion gap: 10 (ref 5–15)
BUN: 12 mg/dL (ref 6–20)
CO2: 26 mmol/L (ref 22–32)
CREATININE: 0.86 mg/dL (ref 0.44–1.00)
Calcium: 8.6 mg/dL — ABNORMAL LOW (ref 8.9–10.3)
Chloride: 103 mmol/L (ref 101–111)
GFR calc Af Amer: >60 mL/min (ref >60)
GLUCOSE: 133 mg/dL — AB (ref 65–99)
POTASSIUM: 4.9 mmol/L (ref 3.5–5.1)
SODIUM: 139 mmol/L (ref 135–145)

## 2017-05-23 LAB — CBC
HCT: 29.2 % — ABNORMAL LOW (ref 36.0–46.0)
Hemoglobin: 9.4 g/dL — ABNORMAL LOW (ref 12.0–15.0)
MCH: 29.7 pg (ref 26.0–34.0)
MCHC: 32.2 g/dL (ref 30.0–36.0)
MCV: 92.4 fL (ref 78.0–100.0)
PLATELETS: 619 10*3/uL — AB (ref 150–400)
RBC: 3.16 MIL/uL — ABNORMAL LOW (ref 3.87–5.11)
RDW: 13.1 % (ref 11.5–15.5)
WBC: 13.8 10*3/uL — ABNORMAL HIGH (ref 4.0–10.5)

## 2017-05-23 MED ORDER — KETOROLAC TROMETHAMINE 15 MG/ML IJ SOLN
15.0000 mg | Freq: Four times a day (QID) | INTRAMUSCULAR | Status: AC
  Administered 2017-05-23 – 2017-05-26 (×12): 15 mg via INTRAVENOUS
  Filled 2017-05-23 (×12): qty 1

## 2017-05-23 MED ORDER — METHOCARBAMOL 1000 MG/10ML IJ SOLN
500.0000 mg | Freq: Four times a day (QID) | INTRAVENOUS | Status: DC | PRN
  Administered 2017-05-24 – 2017-05-25 (×2): 500 mg via INTRAVENOUS
  Filled 2017-05-23 (×2): qty 550

## 2017-05-23 MED ORDER — ACETAMINOPHEN 325 MG PO TABS
650.0000 mg | ORAL_TABLET | Freq: Four times a day (QID) | ORAL | Status: DC
  Administered 2017-05-23 – 2017-05-29 (×23): 650 mg via ORAL
  Filled 2017-05-23 (×23): qty 2

## 2017-05-23 MED ORDER — OXYCODONE HCL 5 MG PO TABS
5.0000 mg | ORAL_TABLET | ORAL | Status: DC | PRN
  Administered 2017-05-23 – 2017-05-29 (×10): 10 mg via ORAL
  Filled 2017-05-23 (×10): qty 2

## 2017-05-23 MED ORDER — ENSURE ENLIVE PO LIQD
237.0000 mL | Freq: Two times a day (BID) | ORAL | Status: DC
  Administered 2017-05-23 – 2017-05-28 (×5): 237 mL via ORAL

## 2017-05-23 MED ORDER — DOCUSATE SODIUM 100 MG PO CAPS
100.0000 mg | ORAL_CAPSULE | Freq: Every day | ORAL | Status: DC
  Administered 2017-05-23 – 2017-05-29 (×7): 100 mg via ORAL
  Filled 2017-05-23 (×8): qty 1

## 2017-05-23 MED ORDER — MORPHINE SULFATE (PF) 2 MG/ML IV SOLN
2.0000 mg | INTRAVENOUS | Status: DC | PRN
  Administered 2017-05-23 (×2): 2 mg via INTRAVENOUS
  Filled 2017-05-23 (×2): qty 1

## 2017-05-23 MED ORDER — ENOXAPARIN SODIUM 40 MG/0.4ML ~~LOC~~ SOLN
40.0000 mg | SUBCUTANEOUS | Status: DC
  Administered 2017-05-23 – 2017-05-28 (×4): 40 mg via SUBCUTANEOUS
  Filled 2017-05-23 (×5): qty 0.4

## 2017-05-23 NOTE — Progress Notes (Signed)
Spoke with patient at bedside, [facility] and neg [facility] at [facility]. [facility] contacted rep and they have accepted referral. Completed [employer] auth form and placed on shadow chart, notified PA to sign. Anticipating d/c sometime next week, will f/u with patient on Monday.

## 2017-05-23 NOTE — Progress Notes (Signed)
[facility] Progress Note  1 Day Post-Op   Assessment/Plan  Free intraabdominal air seen on CT - S/P exploratory laparoscopy, LOA, small bowel resection x 2, rigid proctoscopy, drainage of abscess, Dr. Sheliah HatchKinsinger, 04/11 - await return of bowel function  - Pain control - encourage ambulation and IS  FEN: clears, IVF VTE: SCD's, lovenox ID: Zosyn 04/10>> Foley: DC today Follow up: Dr. Sheliah HatchKinsinger 2 weeks  DISPO: dc foley, encourage IS and ambulation, added scheduled tylenol and toradol, path pending    LOS: 0 days    Subjective: CC; abdominal pain  Pain worse in the RUQ and epigastric region. Worse with movement or deep breath. Pain not well controlled. Pulling roughly 700 on IS. No flatus or BM since surgery. No nausea or vomiting overnight. Pt has not been out of bed yet. No family at bedside.   Objective: Vital signs in last 24 hours: Temp:  [97.5 F (36.4 C)-98.6 F (37 C)] 98.6 F (37 C) (04/12 0505) Pulse Rate:  [75-98] 80 (04/12 0505) Resp:  [12-19] 15 (04/12 0505) BP: (113-136)/(61-81) 123/69 (04/12 0505) SpO2:  [93 %-100 %] 96 % (04/12 0505) Weight:  [71.7 kg (158 lb)] 71.7 kg (158 lb) (04/11 1206) Last BM Date: 05/22/17  Intake/Output from previous day: 04/11 0701 - 04/12 0700 In: 3732 [P.O.:120; I.V.:3512; IV Piggyback:100] Out: 1430 [Urine:960; Drains:370; Blood:100] Intake/Output this shift: No intake/output data recorded.  PE: Gen:  Alert, NAD, pleasant, cooperative Card:  RRR, no M/G/R heard Pulm:  CTA, no W/R/R, rate and effort normal Abd: Soft, not distended, hypoactive BS, incisions C/D/I, wound vac in place, drain with minimal serosanguinous drainage, TTP in RUQ and epigastric region, no guarding, no peritonitis.  Skin: no rashes noted, warm and dry   Anti-infectives: Anti-infectives (From admission, onward)   Start     Dose/Rate Route Frequency Ordered Stop   05/21/17 1815  piperacillin-tazobactam (ZOSYN) IVPB 3.375 g      3.375 g 12.5 mL/hr over 240 Minutes Intravenous Every 8 hours 05/21/17 1737        Lab Results:  Recent Labs    05/22/17 0745 05/23/17 0441  WBC 5.5 13.8*  HGB 9.7* 9.4*  HCT 30.3* 29.2*  PLT 550* 619*   BMET Recent Labs    05/22/17 0745 05/23/17 0441  NA 140 139  K 3.7 4.9  CL 103 103  CO2 27 26  GLUCOSE 104* 133*  BUN 14 12  CREATININE 0.87 0.86  CALCIUM 8.8* 8.6*   PT/INR No results for input(s): LABPROT, INR in the last 72 hours. CMP     Component Value Date/Time   NA 139 05/23/2017 0441   K 4.9 05/23/2017 0441   CL 103 05/23/2017 0441   CO2 26 05/23/2017 0441   GLUCOSE 133 (H) 05/23/2017 0441   BUN 12 05/23/2017 0441   CREATININE 0.86 05/23/2017 0441   CALCIUM 8.6 (L) 05/23/2017 0441   PROT 7.9 05/21/2017 1432   ALBUMIN 3.4 (L) 05/21/2017 1432   AST 23 05/21/2017 1432   ALT 22 05/21/2017 1432   ALKPHOS 161 (H) 05/21/2017 1432   BILITOT 0.3 05/21/2017 1432   GFRNONAA >60 05/23/2017 0441   GFRAA >60 05/23/2017 0441   Lipase     Component Value Date/Time   LIPASE 28 05/21/2017 1432    Studies/Results: No results found.    Jerre SimonJessica L Mekenna Finau , [facility] 05/23/2017, 8:57 AM  Pager: [phone] Mon-Wed, Friday 7:00am-4:30pm Thurs 7am-11:30am  Consults: [phone]

## 2017-05-23 NOTE — Consult Note (Signed)
WOC Nurse wound consult note Reason for Consult: Lower abdominal surgical wound.  Healing by secondary intention with aid of NPWT (VAC) therapy Wound type: surgical wound Pressure Injury POA: NA Measurement:not assesed surgeon to determine first post op VAC change.  Surgery less than 24 hours ago.  Seal is intact.  Patient is anxious about pain.   Dressing procedure/placement/frequency:  Once surgeon has ordered:   Remove old NPWT dressing Cleansed wound with normal saline Periwound skin protected with skin barrier wipe or window framed with drape Filled wound with foam.  Sealed NPWT dressing at 125mm HG/16700mmHG  Patient to receive IV/PO pain medication per bedside nurse prior to dressing change WOC team will follow and await surgical team orders for first pot op dressing change.  Maple HudsonKaren Natausha Jungwirth RN BSN CWON Pager [phone]

## 2017-05-23 NOTE — Progress Notes (Signed)
Pt states  the prn medication, Toradol and Oxy,  we are giving her does not do much for her pain.

## 2017-05-24 LAB — CBC
HCT: 24.4 % — ABNORMAL LOW (ref 36.0–46.0)
Hemoglobin: 7.9 g/dL — ABNORMAL LOW (ref 12.0–15.0)
MCH: 30.7 pg (ref 26.0–34.0)
MCHC: 32.4 g/dL (ref 30.0–36.0)
MCV: 94.9 fL (ref 78.0–100.0)
PLATELETS: 451 10*3/uL — AB (ref 150–400)
RBC: 2.57 MIL/uL — AB (ref 3.87–5.11)
RDW: 13.7 % (ref 11.5–15.5)
WBC: 9.7 10*3/uL (ref 4.0–10.5)

## 2017-05-24 MED ORDER — MORPHINE SULFATE (PF) 2 MG/ML IV SOLN
1.0000 mg | INTRAVENOUS | Status: DC | PRN
  Administered 2017-05-24 – 2017-05-28 (×4): 2 mg via INTRAVENOUS
  Filled 2017-05-24 (×4): qty 1

## 2017-05-24 NOTE — Progress Notes (Signed)
Medication clarification order received form Dr. Daphine DeutscherMartin via phone.

## 2017-05-24 NOTE — Progress Notes (Signed)
Patient ID: Kim Snow, female   DOB: 06-14-1960, 57 y.o.   MRN: [id number] [facility] Progress Note:   2 Days Post-Op  Subjective: Mental status is clear.  Complaining that pain meds are not helping Objective: Vital signs in last 24 hours: Temp:  [97.8 F (36.6 C)-98.9 F (37.2 C)] 97.8 F (36.6 C) (04/13 0521) Pulse Rate:  [79-94] 94 (04/13 0521) Resp:  [17-18] 18 (04/13 0521) BP: (106-121)/(57-80) 120/80 (04/13 0521) SpO2:  [99 %-100 %] 99 % (04/13 0521)  Intake/Output from previous day: 04/12 0701 - 04/13 0700 In: 2641.7 [P.O.:180; I.V.:2411.7; IV Piggyback:50] Out: 1430 [Urine:1300; Drains:130] Intake/Output this shift: No intake/output data recorded.  Physical Exam: Work of breathing is normal.  Complaining of abdominal pain  Lab Results:  Results for orders placed or performed during the hospital encounter of 05/21/17 (from the past 48 hour(s))  Type and screen [place]     Status: None   Collection Time: 05/22/17  9:39 AM  Result Value Ref Range   ABO/RH(D) O POS    Antibody Screen NEG    Sample Expiration      05/25/2017 Performed at [facility], [place] [address] [place], [place] [address]   ABO/Rh     Status: None   Collection Time: 05/22/17  9:39 AM  Result Value Ref Range   ABO/RH(D)      O POS Performed at [facility], [place] [address], [place], [place] [address]   Surgical pcr screen     Status: None   Collection Time: 05/22/17 11:12 AM  Result Value Ref Range   MRSA, PCR NEGATIVE NEGATIVE   Staphylococcus aureus NEGATIVE NEGATIVE    Comment: (NOTE) The Xpert SA Assay (FDA approved for NASAL specimens in patients 63 years of age and older), is one component of a comprehensive surveillance program. It is not intended to diagnose infection nor to guide or monitor treatment. Performed at [facility], [place] [address], [place], [place] [address]   CBC      Status: Abnormal   Collection Time: 05/23/17  4:41 AM  Result Value Ref Range   WBC 13.8 (H) 4.0 - 10.5 K/uL   RBC 3.16 (L) 3.87 - 5.11 MIL/uL   Hemoglobin 9.4 (L) 12.0 - 15.0 g/dL   HCT 29.2 (L) 36.0 - 46.0 %   MCV 92.4 78.0 - 100.0 fL   MCH 29.7 26.0 - 34.0 pg   MCHC 32.2 30.0 - 36.0 g/dL   RDW 13.1 11.5 - 15.5 %   Platelets 619 (H) 150 - 400 K/uL    Comment: Performed at [facility], [place] [address], [place], [place] [address]  Basic metabolic panel     Status: Abnormal   Collection Time: 05/23/17  4:41 AM  Result Value Ref Range   Sodium 139 135 - 145 mmol/L   Potassium 4.9 3.5 - 5.1 mmol/L    Comment: DELTA CHECK NOTED   Chloride 103 101 - 111 mmol/L   CO2 26 22 - 32 mmol/L   Glucose, Bld 133 (H) 65 - 99 mg/dL   BUN 12 6 - 20 mg/dL   Creatinine, Ser 0.86 0.44 - 1.00 mg/dL   Calcium 8.6 (L) 8.9 - 10.3 mg/dL   GFR calc non Af Amer >60 >60 mL/min   GFR calc Af Amer >60 >60 mL/min    Comment: (NOTE) The eGFR has been calculated using the CKD EPI equation. This calculation has  not been validated in all clinical situations. eGFR's persistently <60 mL/min signify possible Chronic Kidney Disease.    Anion gap 10 5 - 15    Comment: Performed at [facility], [place] [address], [place], [place] [address]  CBC     Status: Abnormal   Collection Time: 05/24/17  4:01 AM  Result Value Ref Range   WBC 9.7 4.0 - 10.5 K/uL   RBC 2.57 (L) 3.87 - 5.11 MIL/uL   Hemoglobin 7.9 (L) 12.0 - 15.0 g/dL   HCT 24.4 (L) 36.0 - 46.0 %   MCV 94.9 78.0 - 100.0 fL   MCH 30.7 26.0 - 34.0 pg   MCHC 32.4 30.0 - 36.0 g/dL   RDW 13.7 11.5 - 15.5 %   Platelets 451 (H) 150 - 400 K/uL    Comment: Performed at [facility], [place] [address] [place], [place] [address]    Radiology/Results: No results found.  Anti-infectives: Anti-infectives (From admission, onward)   Start     Dose/Rate Route Frequency Ordered Stop   05/21/17 1815   piperacillin-tazobactam (ZOSYN) IVPB 3.375 g     3.375 g 12.5 mL/hr over 240 Minutes Intravenous Every 8 hours 05/21/17 1737        Assessment/Plan: Problem List: Patient Active Problem List   Diagnosis Date Noted  . Pneumoperitoneum 05/23/2017  . Diverticulitis large intestine 05/21/2017  . Diverticulitis of large intestine with perforation and abscess 05/06/2017  . Diverticulitis of colon with perforation 05/05/2017    Will try Fentanyl PCA.   2 Days Post-Op    LOS: 1 day   Matt B. Hassell Done, MD, [facility] [phone] beeper [phone]  05/24/2017 8:56 AM

## 2017-05-25 DIAGNOSIS — K651 Peritoneal abscess: Secondary | ICD-10-CM

## 2017-05-25 DIAGNOSIS — K631 Perforation of intestine (nontraumatic): Secondary | ICD-10-CM

## 2017-05-25 LAB — CBC
HEMATOCRIT: 23.4 % — AB (ref 36.0–46.0)
Hemoglobin: 7.6 g/dL — ABNORMAL LOW (ref 12.0–15.0)
MCH: 30.4 pg (ref 26.0–34.0)
MCHC: 32.5 g/dL (ref 30.0–36.0)
MCV: 93.6 fL (ref 78.0–100.0)
Platelets: 382 10*3/uL (ref 150–400)
RBC: 2.5 MIL/uL — ABNORMAL LOW (ref 3.87–5.11)
RDW: 13.5 % (ref 11.5–15.5)
WBC: 11.6 10*3/uL — ABNORMAL HIGH (ref 4.0–10.5)

## 2017-05-25 NOTE — Progress Notes (Signed)
IV team notified to assess current IV site and possibly retart. Currently at bedside. Lt fore arm puffy with pain laterally adjacent to site. IV flushes well yet no blood return. See IV team notes/flowsheet activity.

## 2017-05-25 NOTE — Progress Notes (Signed)
Patient ID: Kim Snow, female   DOB: 01-20-1961, 57 y.o.   MRN: [id number] [facility] Progress [facility]:   3 Days Post-Op  Subjective: Mental status is clear.  She is vaccilating about her pain meds.  Up and showering.  Taking clear Objective: Vital signs in last 24 hours: Temp:  [98.4 F (36.9 C)-98.7 F (37.1 C)] 98.7 F (37.1 C) (04/14 0502) Pulse Rate:  [92-97] 92 (04/14 0502) Resp:  [14-18] 14 (04/14 0502) BP: (118-134)/(63-78) 128/77 (04/14 0502) SpO2:  [99 %-100 %] 100 % (04/14 0502)  Intake/Output from previous day: 04/13 0701 - 04/14 0700 In: 2520 [P.O.:370; I.V.:2000; IV Piggyback:150] Out: 1630 [Urine:1575; Drains:55] Intake/Output this shift: No intake/output data recorded.  Physical Exam: Work of breathing is normal.  Minimal pain in abdomen.   Lab Results:  Results for orders placed or performed during the hospital encounter of 05/21/17 (from the past 48 hour(s))  CBC     Status: Abnormal   Collection Time: 05/24/17  4:01 AM  Result Value Ref Range   WBC 9.7 4.0 - 10.5 K/uL   RBC 2.57 (L) 3.87 - 5.11 MIL/uL   Hemoglobin 7.9 (L) 12.0 - 15.0 g/dL   HCT 11.924.4 (L) 14.736.0 - 82.946.0 %   MCV 94.9 78.0 - 100.0 fL   MCH 30.7 26.0 - 34.0 pg   MCHC 32.4 30.0 - 36.0 g/dL   RDW 56.213.7 13.011.5 - 86.515.5 %   Platelets 451 (H) 150 - 400 K/uL    Comment: Performed at [facility], [address] [place], [place] [address]  CBC     Status: Abnormal   Collection Time: 05/25/17  3:47 AM  Result Value Ref Range   WBC 11.6 (H) 4.0 - 10.5 K/uL   RBC 2.50 (L) 3.87 - 5.11 MIL/uL   Hemoglobin 7.6 (L) 12.0 - 15.0 g/dL   HCT 62.923.4 (L) 52.836.0 - 41.346.0 %   MCV 93.6 78.0 - 100.0 fL   MCH 30.4 26.0 - 34.0 pg   MCHC 32.5 30.0 - 36.0 g/dL   RDW 24.413.5 01.011.5 - 27.215.5 %   Platelets 382 150 - 400 K/uL    Comment: Performed at [facility], [address] [place], [place] [address]    Radiology/Results: No results  found.  Anti-infectives: Anti-infectives (From admission, onward)   Start     Dose/Rate Route Frequency Ordered Stop   05/21/17 1815  piperacillin-tazobactam (ZOSYN) IVPB 3.375 g     3.375 g 12.5 mL/hr over 240 Minutes Intravenous Every 8 hours 05/21/17 1737        Assessment/Plan: Problem List: Patient Active Problem List   Diagnosis Date Noted  . Pneumoperitoneum 05/23/2017  . Diverticulitis large intestine 05/21/2017  . Diverticulitis of large intestine with perforation and abscess 05/06/2017  . Diverticulitis of colon with perforation 05/05/2017    Will advance to full liquids 3 Days Post-Op    LOS: 2 days   Matt B. Daphine DeutscherMartin, MD, [facility]  [facility] [phone] [phone]  05/25/2017 8:27 AM

## 2017-05-26 LAB — CBC
HCT: 23 % — ABNORMAL LOW (ref 36.0–46.0)
HEMOGLOBIN: 7.4 g/dL — AB (ref 12.0–15.0)
MCH: 30.1 pg (ref 26.0–34.0)
MCHC: 32.2 g/dL (ref 30.0–36.0)
MCV: 93.5 fL (ref 78.0–100.0)
PLATELETS: 422 10*3/uL — AB (ref 150–400)
RBC: 2.46 MIL/uL — AB (ref 3.87–5.11)
RDW: 13.7 % (ref 11.5–15.5)
WBC: 7.9 10*3/uL (ref 4.0–10.5)

## 2017-05-26 MED ORDER — TRAMADOL HCL 50 MG PO TABS
50.0000 mg | ORAL_TABLET | Freq: Four times a day (QID) | ORAL | Status: DC
  Administered 2017-05-26 – 2017-05-29 (×12): 50 mg via ORAL
  Filled 2017-05-26 (×12): qty 1

## 2017-05-26 NOTE — Progress Notes (Signed)
[facility] Progress Note  4 Days Post-Op   Assessment/Plan Free intraabdominal air seen on CT - S/P exploratory laparoscopy, LOA, small bowel resection x 2, rigid proctoscopy, drainage of abscess, Dr. Sheliah HatchKinsinger, 04/11 - Pain control - encourage ambulation and IS  FEN: soft diet VTE: SCD's, lovenox ID: Zosyn 04/10>> Foley: DC today Follow up: Dr. Sheliah HatchKinsinger 2 weeks  DISPO: encourage IS and ambulation, first vac change today.    LOS: 3 days    Subjective: CC: lower abdominal pain  Pt states she walked this am and is having increased pain in her lower abdomen. No nausea or vomiting. Tolerating FLD. Had a BM. Anxious about vac change. No fever or chills. Oxycodone gives her a headache.   Objective: Vital signs in last 24 hours: Temp:  [99.1 F (37.3 C)-99.3 F (37.4 C)] 99.1 F (37.3 C) (04/15 0526) Pulse Rate:  [86-94] 86 (04/15 0526) Resp:  [17-18] 17 (04/15 0526) BP: (135-144)/(76-87) 144/80 (04/15 0526) SpO2:  [99 %-100 %] 99 % (04/15 0526) Last BM Date: 05/25/17  Intake/Output from previous day: 04/14 0701 - 04/15 0700 In: 4748.5 [P.O.:240; I.V.:4318.5; IV Piggyback:190] Out: 2270 [Urine:2250; Drains:20] Intake/Output this shift: Total I/O In: 120 [P.O.:120] Out: 700 [Urine:700]  PE: Gen:  Alert, NAD, pleasant, cooperative Card:  RRR, no M/G/R heard Pulm:  CTA, no W/R/R, rate and effort normal Abd: Soft, not distended, + BS, incisions C/D/I, wound vac in place, drain with minimal serosanguinous drainage, TTP in lower abdomen around site of wound vac, no guarding, no peritonitis.  Skin: no rashes noted, warm and dry   Anti-infectives: Anti-infectives (From admission, onward)   Start     Dose/Rate Route Frequency Ordered Stop   05/21/17 1815  piperacillin-tazobactam (ZOSYN) IVPB 3.375 g     3.375 g 12.5 mL/hr over 240 Minutes Intravenous Every 8 hours 05/21/17 1737        Lab Results:  Recent Labs    05/25/17 0347  05/26/17 0408  WBC 11.6* 7.9  HGB 7.6* 7.4*  HCT 23.4* 23.0*  PLT 382 422*   BMET No results for input(s): NA, K, CL, CO2, GLUCOSE, BUN, CREATININE, CALCIUM in the last 72 hours. PT/INR No results for input(s): LABPROT, INR in the last 72 hours. CMP     Component Value Date/Time   NA 139 05/23/2017 0441   K 4.9 05/23/2017 0441   CL 103 05/23/2017 0441   CO2 26 05/23/2017 0441   GLUCOSE 133 (H) 05/23/2017 0441   BUN 12 05/23/2017 0441   CREATININE 0.86 05/23/2017 0441   CALCIUM 8.6 (L) 05/23/2017 0441   PROT 7.9 05/21/2017 1432   ALBUMIN 3.4 (L) 05/21/2017 1432   AST 23 05/21/2017 1432   ALT 22 05/21/2017 1432   ALKPHOS 161 (H) 05/21/2017 1432   BILITOT 0.3 05/21/2017 1432   GFRNONAA >60 05/23/2017 0441   GFRAA >60 05/23/2017 0441   Lipase     Component Value Date/Time   LIPASE 28 05/21/2017 1432    Studies/Results: No results found.    Jerre SimonJessica L Everlena Mackley , [facility] LPA-C [facility] 05/26/2017, 9:02 AM  Pager: [phone] Mon-Wed, Friday 7:00am-4:30pm Thurs 7am-11:30am  Consults: [phone]

## 2017-05-26 NOTE — Consult Note (Signed)
WOC Nurse wound consult note Reason for Consult: NPWT dressing change  Pain management issues, will need pre medication for dressing change  Wound type: surgical  Pressure Injury POA: NA Measurement:2cm x 9cm x 4cm  Wound ZOX:WRUEAbed:clean, subcutaneous tissue Drainage (amount, consistency, odor) bloody, minimal in canister Periwound:intact, drape in the patient's pubic hair today. PA ordered for area to be clipped, clipped with surgical clippers with incision covered  Dressing procedure/placement/frequency: Removed old NPWT dressing Filled wound with  ____1__ piece of black foam. Sealed NPWT dressing at 125mm HG Patient received IV/PO pain medication per bedside nurse prior to dressing change. Patient tolerated procedure well  Ok for bedside nurse to change non-complex NPWT dressing M/W/F    Discussed POC with patient and bedside nurse.  Re consult if needed, will not follow at this time. Thanks  Bentlie Catanzaro M.D.C. [employer] MSN, RN,CWOCN, CNS, CWON-AP [phone]

## 2017-05-27 LAB — URINALYSIS, ROUTINE W REFLEX MICROSCOPIC
Bacteria, UA: NONE SEEN
Bilirubin Urine: NEGATIVE
GLUCOSE, UA: NEGATIVE mg/dL
Ketones, ur: NEGATIVE mg/dL
Leukocytes, UA: NEGATIVE
Nitrite: NEGATIVE
PH: 6 (ref 5.0–8.0)
Protein, ur: NEGATIVE mg/dL
RBC / HPF: NONE SEEN RBC/hpf (ref 0–5)
Specific Gravity, Urine: 1.002 — ABNORMAL LOW (ref 1.005–1.030)

## 2017-05-27 LAB — CBC
HCT: 24.7 % — ABNORMAL LOW (ref 36.0–46.0)
Hemoglobin: 8.2 g/dL — ABNORMAL LOW (ref 12.0–15.0)
MCH: 30.3 pg (ref 26.0–34.0)
MCHC: 33.2 g/dL (ref 30.0–36.0)
MCV: 91.1 fL (ref 78.0–100.0)
PLATELETS: 424 10*3/uL — AB (ref 150–400)
RBC: 2.71 MIL/uL — AB (ref 3.87–5.11)
RDW: 13.8 % (ref 11.5–15.5)
WBC: 15.6 10*3/uL — AB (ref 4.0–10.5)

## 2017-05-27 MED ORDER — IBUPROFEN 200 MG PO TABS
600.0000 mg | ORAL_TABLET | Freq: Four times a day (QID) | ORAL | Status: DC | PRN
  Administered 2017-05-27: 600 mg via ORAL
  Filled 2017-05-27: qty 3

## 2017-05-27 NOTE — Progress Notes (Addendum)
[facility] Progress Note  5 Days Post-Op   Assessment/Plan Free intraabdominal air seen on CT - S/P exploratory laparoscopy, LOA, small bowel resection x 2, rigid proctoscopy, drainage of abscess, Dr. Sheliah HatchKinsinger, 04/11 - Pain control - encourage ambulation and IS  ZOX:WRUEFEN:soft diet VTE: SCD's, lovenox AV:WUJWJ:Zosyn 04/10>> Foley:no Follow up:Dr. Kinsinger 2 weeks  DISPO:having bowel function. Encourage IS and ambulation, Hg improved. WBC up but afebrile. Will repeat CBC tomorrow. Insurance did not approve wound vac. Pt will go home with wet to dry dressing changes at discharge.     LOS: 4 days    Subjective: CC: lower abdominal pain  Pt states she is feeling better and pain is better today. No nausea or vomiting. Tolerating soft diet although not eating much. Had another BM. No fever or chills. No new complaints.     Objective: Vital signs in last 24 hours: Temp:  [98.2 F (36.8 C)-99.4 F (37.4 C)] 99.4 F (37.4 C) (04/16 0648) Pulse Rate:  [94-101] 101 (04/16 0648) Resp:  [18] 18 (04/16 0648) BP: (131-138)/(79-81) 131/79 (04/16 0648) SpO2:  [97 %-100 %] 97 % (04/16 0648) Last BM Date: 05/26/17  Intake/Output from previous day: 04/15 0701 - 04/16 0700 In: 1350 [P.O.:1200; IV Piggyback:150] Out: 2925 [Urine:2900; Drains:25] Intake/Output this shift: No intake/output data recorded.  PE: Gen: Alert, NAD, pleasant, cooperative Card: RRR, no M/G/R heard Pulm: CTA, no W/R/R, rate andeffort normal Abd: Soft,not distended, +BS, incisions C/D/I,wound vac in place,drain with minimalsanguinous drainage,TTP in lower abdomen around site of wound vac, no guarding, no peritonitis. Skin: no rashes noted, warm and dry  Anti-infectives: Anti-infectives (From admission, onward)   Start     Dose/Rate Route Frequency Ordered Stop   05/21/17 1815  piperacillin-tazobactam (ZOSYN) IVPB 3.375 g     3.375 g 12.5 mL/hr over 240 Minutes Intravenous Every  8 hours 05/21/17 1737        Lab Results:  Recent Labs    05/26/17 0408 05/27/17 0917  WBC 7.9 15.6*  HGB 7.4* 8.2*  HCT 23.0* 24.7*  PLT 422* 424*   BMET No results for input(s): NA, K, CL, CO2, GLUCOSE, BUN, CREATININE, CALCIUM in the last 72 hours. PT/INR No results for input(s): LABPROT, INR in the last 72 hours. CMP     Component Value Date/Time   NA 139 05/23/2017 0441   K 4.9 05/23/2017 0441   CL 103 05/23/2017 0441   CO2 26 05/23/2017 0441   GLUCOSE 133 (H) 05/23/2017 0441   BUN 12 05/23/2017 0441   CREATININE 0.86 05/23/2017 0441   CALCIUM 8.6 (L) 05/23/2017 0441   PROT 7.9 05/21/2017 1432   ALBUMIN 3.4 (L) 05/21/2017 1432   AST 23 05/21/2017 1432   ALT 22 05/21/2017 1432   ALKPHOS 161 (H) 05/21/2017 1432   BILITOT 0.3 05/21/2017 1432   GFRNONAA >60 05/23/2017 0441   GFRAA >60 05/23/2017 0441   Lipase     Component Value Date/Time   LIPASE 28 05/21/2017 1432    Studies/Results: No results found.    Jerre SimonJessica L Bernardo Brayman , [facility] 05/27/2017, 9:56 AM  Pager: [phone] Mon-Wed, Friday 7:00am-4:30pm Thurs 7am-11:30am  Consults: [phone]

## 2017-05-27 NOTE — Progress Notes (Signed)
Received a call from [employer] rep, [employer] has denied auth for vac. Contacted [employer] and they state that they would be denied as well if [employer] not approved. Notified PA, plan to leave vac until d/c and change to wet to dry at d/c. [phone]

## 2017-05-28 LAB — CBC
HCT: 22.7 % — ABNORMAL LOW (ref 36.0–46.0)
Hemoglobin: 7.5 g/dL — ABNORMAL LOW (ref 12.0–15.0)
MCH: 30.2 pg (ref 26.0–34.0)
MCHC: 33 g/dL (ref 30.0–36.0)
MCV: 91.5 fL (ref 78.0–100.0)
Platelets: 415 10*3/uL — ABNORMAL HIGH (ref 150–400)
RBC: 2.48 MIL/uL — ABNORMAL LOW (ref 3.87–5.11)
RDW: 13.9 % (ref 11.5–15.5)
WBC: 10.2 10*3/uL (ref 4.0–10.5)

## 2017-05-28 MED ORDER — TAB-A-VITE/IRON PO TABS
1.0000 | ORAL_TABLET | Freq: Every day | ORAL | Status: DC
  Administered 2017-05-28 – 2017-05-29 (×2): 1 via ORAL
  Filled 2017-05-28 (×2): qty 1

## 2017-05-28 NOTE — Progress Notes (Addendum)
[facility]/Trauma Progress Note  6 Days Post-Op   Assessment/Plan Free intraabdominal air seen on CT - S/P exploratory laparoscopy, LOA, small bowel resection x 2, rigid proctoscopy, drainage of abscess, Dr. Sheliah HatchKinsinger, 04/11 - Pain control - encourage ambulation and IS  ZOX:WRUEFEN:soft diet VTE: SCD's, lovenox AV:WUJWJ:Zosyn 04/10>> Foley:no Follow up:Dr. Kinsinger 2 weeks  DISPO:WBC WNL and no fevers overnight. having bowel function. Encourage IS and ambulation,Repeat CBC tomorrow. transition to wet to dry dressing changes.     LOS: 5 days    Subjective: CC: lower abdominal pain  Pt states pain is improved. Still tolerating diet and having flatus and BM's. No issues overnight. No fever or chills. No nausea or vomiting. No swelling or tenderness in calves. Pt is upset that we would like her to stay one more night to recheck WBC and monitor her for fevers. Pt states no cough or SOB. She is using her IS.  Objective: Vital signs in last 24 hours: Temp:  [98 F (36.7 C)-100 F (37.8 C)] 98 F (36.7 C) (04/17 0534) Pulse Rate:  [94-108] 94 (04/17 0534) Resp:  [14-16] 14 (04/17 0534) BP: (131-139)/(78-86) 131/78 (04/17 0534) SpO2:  [97 %-100 %] 97 % (04/17 0534) Last BM Date: 05/27/17  Intake/Output from previous day: 04/16 0701 - 04/17 0700 In: 930 [P.O.:780; IV Piggyback:150] Out: 530 [Urine:500; Drains:30] Intake/Output this shift: No intake/output data recorded.  PE: Gen: Alert, NAD, pleasant, cooperative Card: RRR, no M/G/R heard Pulm: CTA, no W/R/R, rate andeffort normal Abd: Soft,not distended,+BS, incisions with steri-strips intact are without signs of infection,wound vac in place,drain with minimalsanguinous drainage,TTP inlower abdomen around site of wound vac, no guarding, no peritonitis. Extremities: no swelling, warmth or tenderness to b/l calves Skin: no rashes noted, warm and dry   Anti-infectives: Anti-infectives (From  admission, onward)   Start     Dose/Rate Route Frequency Ordered Stop   05/21/17 1815  piperacillin-tazobactam (ZOSYN) IVPB 3.375 g     3.375 g 12.5 mL/hr over 240 Minutes Intravenous Every 8 hours 05/21/17 1737        Lab Results:  Recent Labs    05/27/17 0917 05/28/17 0425  WBC 15.6* 10.2  HGB 8.2* 7.5*  HCT 24.7* 22.7*  PLT 424* 415*   BMET No results for input(s): NA, K, CL, CO2, GLUCOSE, BUN, CREATININE, CALCIUM in the last 72 hours. PT/INR No results for input(s): LABPROT, INR in the last 72 hours. CMP     Component Value Date/Time   NA 139 05/23/2017 0441   K 4.9 05/23/2017 0441   CL 103 05/23/2017 0441   CO2 26 05/23/2017 0441   GLUCOSE 133 (H) 05/23/2017 0441   BUN 12 05/23/2017 0441   CREATININE 0.86 05/23/2017 0441   CALCIUM 8.6 (L) 05/23/2017 0441   PROT 7.9 05/21/2017 1432   ALBUMIN 3.4 (L) 05/21/2017 1432   AST 23 05/21/2017 1432   ALT 22 05/21/2017 1432   ALKPHOS 161 (H) 05/21/2017 1432   BILITOT 0.3 05/21/2017 1432   GFRNONAA >60 05/23/2017 0441   GFRAA >60 05/23/2017 0441   Lipase     Component Value Date/Time   LIPASE 28 05/21/2017 1432    Studies/Results: No results found.    Jerre SimonJessica L Manveer Gomes , [facility] 05/28/2017, 9:02 AM  Pager: [phone] Mon-Wed, Friday 7:00am-4:30pm Thurs 7am-11:30am  Consults: [phone]

## 2017-05-29 LAB — TYPE AND SCREEN
ABO/RH(D): O POS
ANTIBODY SCREEN: NEGATIVE

## 2017-05-29 LAB — CBC
HCT: 23.3 % — ABNORMAL LOW (ref 36.0–46.0)
HEMOGLOBIN: 7.7 g/dL — AB (ref 12.0–15.0)
MCH: 30.4 pg (ref 26.0–34.0)
MCHC: 33 g/dL (ref 30.0–36.0)
MCV: 92.1 fL (ref 78.0–100.0)
Platelets: 455 10*3/uL — ABNORMAL HIGH (ref 150–400)
RBC: 2.53 MIL/uL — AB (ref 3.87–5.11)
RDW: 13.9 % (ref 11.5–15.5)
WBC: 7.2 10*3/uL (ref 4.0–10.5)

## 2017-05-29 LAB — BASIC METABOLIC PANEL
Anion gap: 11 (ref 5–15)
BUN: 5 mg/dL — AB (ref 6–20)
CALCIUM: 8.8 mg/dL — AB (ref 8.9–10.3)
CO2: 28 mmol/L (ref 22–32)
Chloride: 102 mmol/L (ref 101–111)
Creatinine, Ser: 0.81 mg/dL (ref 0.44–1.00)
GFR calc Af Amer: >60 mL/min (ref >60)
GLUCOSE: 96 mg/dL (ref 65–99)
POTASSIUM: 4 mmol/L (ref 3.5–5.1)
Sodium: 141 mmol/L (ref 135–145)

## 2017-05-29 MED ORDER — TRAMADOL HCL 50 MG PO TABS: 50.0000 mg | ORAL_TABLET | Freq: Four times a day (QID) | ORAL | 0 refills | Status: DC

## 2017-05-29 MED ORDER — OXYCODONE HCL 5 MG PO TABS: 5.0000 mg | ORAL_TABLET | Freq: Four times a day (QID) | ORAL | 0 refills | Status: DC | PRN

## 2017-05-29 NOTE — Discharge Instructions (Signed)
[facility]      [facility] °[phone] ° °OPEN ABDOMINAL SURGERY: POST OP INSTRUCTIONS ° °Always review your discharge instruction sheet given to you by the facility where your surgery was performed. ° °IF YOU HAVE DISABILITY OR FAMILY LEAVE FORMS, YOU MUST BRING THEM TO THE OFFICE FOR PROCESSING.  PLEASE DO NOT GIVE THEM TO YOUR DOCTOR. ° °1. A prescription for pain medication may be given to you upon discharge.  Take your pain medication as prescribed, if needed.  If narcotic pain medicine is not needed, then you may take acetaminophen (Tylenol) or ibuprofen (Advil) as needed. °2. Take your usually prescribed medications unless otherwise directed. °3. If you need a refill on your pain medication, please contact your pharmacy. They will contact our office to request authorization.  Prescriptions will not be filled after 5pm or on week-ends. °4. You should follow a light diet the first few days after arrival home, such as soup and crackers, pudding, etc.unless your doctor has advised otherwise. A high-fiber, low fat diet can be resumed as tolerated.   Be sure to include lots of fluids daily. Most patients will experience some swelling and bruising on the chest and neck area.  Ice packs will help.  Swelling and bruising can take several days to resolve °5. Most patients will experience some swelling and bruising in the area of the incision. Ice pack will help. Swelling and bruising can take several days to resolve..  °6. It is common to experience some constipation if taking pain medication after surgery.  Increasing fluid intake and taking a stool softener will usually help or prevent this problem from occurring.  A mild laxative (Milk of Magnesia or Miralax) should be taken according to package directions if there are no bowel movements after 48 hours. °7.  You may have steri-strips (small skin tapes) in place directly over the incision.  These strips should be left on the skin for 7-10 days.  If your  surgeon used skin glue on the incision, you may shower in 24 hours.  The glue will flake off over the next 2-3 weeks.  Any sutures or staples will be removed at the office during your follow-up visit. You may find that a light gauze bandage over your incision may keep your staples from being rubbed or pulled. You may shower and replace the bandage daily. °8. ACTIVITIES:  You may resume regular (light) daily activities beginning the next day--such as daily self-care, walking, climbing stairs--gradually increasing activities as tolerated.  You may have sexual intercourse when it is comfortable.  Refrain from any heavy lifting or straining until approved by your doctor. °a. You may drive when you no longer are taking prescription pain medication, you can comfortably wear a seatbelt, and you can safely maneuver your car and apply brakes °b. Return to Work: ___________________________________ °9. You should see your doctor in the office for a follow-up appointment approximately two weeks after your surgery.  Make sure that you call for this appointment within a day or two after you arrive home to insure a convenient appointment time. °OTHER INSTRUCTIONS:  °_____________________________________________________________ °_____________________________________________________________ ° °WHEN TO CALL YOUR DOCTOR: °1. Fever over 101.0 °2. Inability to urinate °3. Nausea and/or vomiting °4. Extreme swelling or bruising °5. Continued bleeding from incision. °6. Increased pain, redness, or drainage from the incision. °7. Difficulty swallowing or breathing °8. Muscle cramping or spasms. °9. Numbness or tingling in hands or feet or around lips. ° °The clinic staff is available to   answer your questions during regular business hours.  Please dont hesitate to call and ask to speak to one of the nurses if you have concerns.  For further questions, please visit [email or web address]   MIDLINE WOUND CARE: - midline  dressing to be changed once or twice daily - supplies: sterile saline, kerlix, scissors, ABD pads, tape  - remove dressing and all packing carefully, moistening with sterile saline as needed to avoid packing/internal dressing sticking to the wound. - clean edges of skin around the wound with water/gauze, making sure there is no tape debris or leakage left on skin that could cause skin irritation or breakdown. - dampen and clean kerlix with sterile saline and pack wound from wound base to skin level, making sure to take note of any possible areas of wound tracking, tunneling and packing appropriately. Wound can be packed loosely. Trim kerlix to size if a whole kerlix is not required. - cover wound with a dry ABD pad and secure with tape.  - write the date/time on the dry dressing/tape to better track when the last dressing change occurred. - apply any skin protectant/powder recommended by clinician to protect skin/skin folds. - change dressing as needed if leakage occurs, wound gets contaminated, or patient requests to shower. - patient may shower daily with wound open and following the shower the wound should be dried and a clean dressing placed.

## 2017-05-29 NOTE — Progress Notes (Signed)
Assessment unchanged. Pt verbalized understanding of dc instructions through teach back including follow up care and when to call the doctor. Scripts x 2 given to pt as provided by MD. Pt understands wet to dry dressing changes for home stating "I can do them and my mom is going to help." [facility]. Supplies provided for dsg change. Discharged via wc to front entrance accompanied by NT to meet mom and brother.

## 2017-05-29 NOTE — Discharge Summary (Signed)
[facility] Discharge Summary   Patient ID: Kim FinnChristine G Schnick MRN: [id number] DOB/AGE: 58/24/1962 58 y.o.  Admit date: 05/21/2017 Discharge date: 05/29/2017  Admitting Diagnosis: Intraabdominal air  Discharge Diagnosis Patient Active Problem List   Diagnosis Date Noted  . Perforation of small intestines s/p SB resection x 2 05/22/2017 05/25/2017  . Abdominopelvic abscess s/p OR drainage 05/25/2017  . Pneumoperitoneum 05/23/2017  . Diverticulitis large intestine 05/21/2017    Consultants none  Imaging: No results found.  Procedures Dr. Sheliah HatchKinsinger (05/22/17) - exploratory laparoscopy, LOA, small bowel resection x 2, rigid proctoscopy, drainage of abscess, drain placement   HPI: 57 yo female with history of diverticulitis who was seen at drain clinic and had her drain removed. Her CT scan was reviewed after the event and was concerning for new air filled collections in her pelvis and some changes to her small intestine concerning for mass or infection. She has had some bloating symptoms for the past month, but overall feels 10x better than when she was admitted previously. She has been eating a normal diet and having normal bowel movements. She has 3 days left of antibiotics  Hospital Course:  Patient was admitted and underwent procedure listed above.  Tolerated procedure well and was transferred to the floor. Pathology showed SEROSITIS AND ADHESIONS. - NO DYSPLASIA OR MALIGNANCY. Diet was advanced as tolerated. On POD#7, the patient was voiding well, tolerating diet, ambulating well, pain well controlled, vital signs stable, incisions c/d/i and felt stable for discharge home. Patient discharged with wet to dry dressing changes to lower abdominal wound, home health, and drain removed. Patient will follow up in our office in 2 weeks and knows to call with questions or concerns.  She will call to confirm appointment date/time.    Patient was discharged in good  condition.  The [place] Substance controlled database was reviewed prior to prescribing narcotic pain medication to this patient.  Physical Exam: Gen: Alert, NAD, pleasant, cooperative Card: RRR, no M/G/R heard Pulm: CTA, no W/R/R, rate andeffort normal Abd: Soft,not distended,+BS, incisions with steri-strips intact are without signs of infection,lower abdominal wound with good base of granulation tissue and healing well.Wound repacked. Drain with minimalsanguinous drainage.No TTP, no guarding, no peritonitis. Extremities: no swelling, warmth or tenderness to b/l calves Skin: no rashes noted, warm and dry   Allergies as of 05/29/2017   No Known Allergies     Medication List    STOP taking these medications   amoxicillin-clavulanate 875-125 MG tablet Commonly known as:  AUGMENTIN     TAKE these medications   acetaminophen 325 MG tablet Commonly known as:  TYLENOL Take 2 tablets (650 mg total) by mouth every 6 (six) hours as needed for mild pain (or temp > 100).   ALPRAZolam 1 MG tablet Commonly known as:  XANAX Take 1 mg by mouth at bedtime as needed for sleep.   CALCIUM PO Take 1,000 mg by mouth daily.   docusate sodium 100 MG capsule Commonly known as:  COLACE Take 1 capsule (100 mg total) by mouth 2 (two) times daily.   IFEREX 150 PO Take 150 mg by mouth daily.   losartan-hydrochlorothiazide 100-25 MG tablet Commonly known as:  HYZAAR Take 1 tablet by mouth daily.   meloxicam 15 MG tablet Commonly known as:  MOBIC Take 15 mg by mouth daily at 12 noon.   omeprazole 40 MG capsule Commonly known as:  PRILOSEC Take 40 mg by mouth daily.   oxyCODONE 5 MG immediate release  tablet Commonly known as:  Oxy IR/ROXICODONE Take 1 tablet (5 mg total) by mouth every 6 (six) hours as needed (5mg  for moderate pain, 10mg  for severe pain). What changed:  reasons to take this   traMADol 50 MG tablet Commonly known as:  ULTRAM Take 1 tablet (50 mg total)  by mouth every 6 (six) hours.   VITAMIN B 12 PO Take 1 tablet by mouth daily.        Follow-up Information    [facility] Follow up.   Specialty:  Home Health Services Why:  nurse to assist with care of wound vac Contact information: [address] [place] [address] [phone]        Kinsinger, De Blanch, MD. Go on 06/13/2017.   Specialty:  General Surgery Why:  at 10:45AM. Please arrive 30 minutes prior to complete paperwork. Contact information: [address] [place] [address] [phone]           Signed: Joyce Copa [facility] 05/29/2017, 8:18 AM Pager: [phone] Consults: [phone] Mon-Fri 7:00 am-4:30 pm Sat-Sun 7:00 am-11:30 am

## 2017-06-09 ENCOUNTER — Encounter: Payer: Self-pay | Admitting: Student

## 2017-06-09 ENCOUNTER — Encounter (HOSPITAL_COMMUNITY): Payer: Self-pay | Admitting: Emergency Medicine

## 2017-06-09 DIAGNOSIS — T8143XA Infection following a procedure, organ and space surgical site, initial encounter: Principal | ICD-10-CM | POA: Diagnosis present

## 2017-06-09 DIAGNOSIS — R6520 Severe sepsis without septic shock: Secondary | ICD-10-CM | POA: Diagnosis present

## 2017-06-09 DIAGNOSIS — I10 Essential (primary) hypertension: Secondary | ICD-10-CM | POA: Diagnosis present

## 2017-06-09 DIAGNOSIS — R42 Dizziness and giddiness: Secondary | ICD-10-CM | POA: Diagnosis not present

## 2017-06-09 DIAGNOSIS — Y836 Removal of other organ (partial) (total) as the cause of abnormal reaction of the patient, or of later complication, without mention of misadventure at the time of the procedure: Secondary | ICD-10-CM | POA: Diagnosis present

## 2017-06-09 DIAGNOSIS — K651 Peritoneal abscess: Secondary | ICD-10-CM | POA: Diagnosis present

## 2017-06-09 DIAGNOSIS — F419 Anxiety disorder, unspecified: Secondary | ICD-10-CM | POA: Diagnosis present

## 2017-06-09 DIAGNOSIS — K219 Gastro-esophageal reflux disease without esophagitis: Secondary | ICD-10-CM | POA: Diagnosis present

## 2017-06-09 DIAGNOSIS — N179 Acute kidney failure, unspecified: Secondary | ICD-10-CM | POA: Diagnosis present

## 2017-06-09 DIAGNOSIS — T8144XA Sepsis following a procedure, initial encounter: Secondary | ICD-10-CM | POA: Diagnosis present

## 2017-06-09 DIAGNOSIS — D649 Anemia, unspecified: Secondary | ICD-10-CM | POA: Diagnosis present

## 2017-06-09 LAB — BASIC METABOLIC PANEL
Anion gap: 13 (ref 5–15)
BUN: 29 mg/dL — AB (ref 6–20)
CHLORIDE: 97 mmol/L — AB (ref 101–111)
CO2: 24 mmol/L (ref 22–32)
CREATININE: 1.22 mg/dL — AB (ref 0.44–1.00)
Calcium: 9.2 mg/dL (ref 8.9–10.3)
GFR calc Af Amer: 56 mL/min — ABNORMAL LOW (ref >60)
GFR calc non Af Amer: 48 mL/min — ABNORMAL LOW (ref >60)
Glucose, Bld: 132 mg/dL — ABNORMAL HIGH (ref 65–99)
Potassium: 3.8 mmol/L (ref 3.5–5.1)
SODIUM: 134 mmol/L — AB (ref 135–145)

## 2017-06-09 LAB — I-STAT BETA HCG BLOOD, ED (MC, WL, AP ONLY): I-stat hCG, quantitative: 5.2 m[IU]/mL — ABNORMAL HIGH (ref <5)

## 2017-06-09 LAB — CBC
HCT: 24.9 % — ABNORMAL LOW (ref 36.0–46.0)
Hemoglobin: 8 g/dL — ABNORMAL LOW (ref 12.0–15.0)
MCH: 27.8 pg (ref 26.0–34.0)
MCHC: 32.1 g/dL (ref 30.0–36.0)
MCV: 86.5 fL (ref 78.0–100.0)
PLATELETS: 793 10*3/uL — AB (ref 150–400)
RBC: 2.88 MIL/uL — ABNORMAL LOW (ref 3.87–5.11)
RDW: 14.9 % (ref 11.5–15.5)
WBC: 18.9 10*3/uL — AB (ref 4.0–10.5)

## 2017-06-09 NOTE — ED Triage Notes (Signed)
Patient here from home with complaints of dizziness and hypotension x3 days. Reports that she was told by her PCP to come here. JP drained removed on 4/9. Denies abdominal pain.

## 2017-06-09 NOTE — Progress Notes (Signed)
Iven Finn Documented: 06/09/2017 2:40 PM Location: [facility] Patient #: [id number] DOB: 05-Feb-1961 Married / Language: English / Race: White Female   History of Present Illness  The patient is a 57 year old female presenting for a post-operative concern. She was admitted to the hospital on 05/05/17 for perforated diverticulitis with abscess. Treatment included percutaneous drain by IR on 05/06/17 and IV antibiotics. Discharged on 05/11/17.   On 05/20/17, she was evaluated by IR and her percutaneous drain was removed. However, on repeat CT scan, there were concerns for new air filled collections in her pelvis and some changes to her small intestine concerning for mass or infection. She was readmitted on 05/21/17 and went to the OR on 05/22/17 with Dr. Sheliah Hatch for lysis of adhesions and small bowel resection with anastomosis  2 for small intestine perforation. A JP drain was also placed during surgery. She was discharged on 05/29/17 after the JP drain was removed.   She is presenting to urgent office today with concerns of drainage from the JP drain site. She states she removed the gauze over the site the day after she was discharged. Everything was going well until 2 days ago when thick, yellow, foul-smelling fluid began draining from the site. Admits to a few days of subjective fevers treated with Tylenol. Denies nausea/vomiting, and constipation/diarrhea. She is eating and drinking okay. Denies much abdominal pain.   Past Medical History: Diagnosis . Anemia . Anxiety . Asthma . Depression . GERD (gastroesophageal reflux disease) . Hypertension . Insomnia . Migraine   Past Surgical History: Procedure Laterality . CESAREAN SECTION . CHOLECYSTECTOMY . IR RADIOLOGIST EVAL & MGMT 05/20/2017 . KNEE ARTHROSCOPY   Social History Reports that she has never smoked. She has never used smokeless tobacco. She reports that she drinks alcohol. She reports that she  does not use drugs.  Allergies  No Known Allergies   Review of Systems Constitutional: Admits to fevers HENT: Negative for hearing loss. Eyes: Negative for blurred vision and double vision. Respiratory: Negative for cough and hemoptysis. Cardiovascular: Negative for chest pain and palpitations. Gastrointestinal: Negative for abdominal pain, nausea and vomiting. Genitourinary: Negative for dysuria and urgency. Musculoskeletal: Negative for myalgias and neck pain. Skin: Negative for itching and rash. Neurological: Negative for dizziness, tingling and headaches. Endo/Heme/Allergies: Does not bruise/bleed easily. Psychiatric/Behavioral: Negative for depression and suicidal ideas.   Physical Exam General Alert, oriented, in no acute distress but does not appear well   Chest and Lung Exam Effort normal   Abdomen Soft, non-tender, non-distended abdomen Transverse incision on lower abdomen - minimal granulation tissue present - healing well without signs of infection Drain site on right abdominal wall with moderate amount of foul-smelling, thick, green, purulent fluid actively draining. 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 Her JP drain was removed on 05/29/17. Over the past 3 days, she has had purulent drainage from the drain site. At this time she is afebrile, but her blood pressure is 86/55 and pulse is 119. I discussed her case with her operating surgeon, Dr. Sheliah Hatch, who recommended that she be sent to the ER for further evaluation, IV fluids, CT scan abdomen and pelvis with contrast, and possibly IV antibiotics. She provided verbal understanding and will go to [facility] long ER immediately.  Signed electronically by Tsosie Billing, PA C (06/09/2017 5:09 PM)

## 2017-06-10 ENCOUNTER — Other Ambulatory Visit: Payer: Self-pay

## 2017-06-10 ENCOUNTER — Encounter (HOSPITAL_COMMUNITY): Payer: Self-pay

## 2017-06-10 ENCOUNTER — Emergency Department (HOSPITAL_COMMUNITY): Payer: BC Managed Care – PPO

## 2017-06-10 ENCOUNTER — Inpatient Hospital Stay (HOSPITAL_COMMUNITY)
Admission: EM | Admit: 2017-06-10 | Discharge: 2017-06-12 | DRG: 862 | Disposition: A | Discharge status: Home / Self Care | Payer: BC Managed Care – PPO | Attending: General Surgery | Admitting: General Surgery

## 2017-06-10 ENCOUNTER — Inpatient Hospital Stay (HOSPITAL_COMMUNITY): Payer: BC Managed Care – PPO

## 2017-06-10 DIAGNOSIS — K651 Peritoneal abscess: Secondary | ICD-10-CM | POA: Diagnosis present

## 2017-06-10 DIAGNOSIS — T8149XA Infection following a procedure, other surgical site, initial encounter: Secondary | ICD-10-CM

## 2017-06-10 DIAGNOSIS — I10 Essential (primary) hypertension: Secondary | ICD-10-CM | POA: Diagnosis present

## 2017-06-10 DIAGNOSIS — T8144XA Sepsis following a procedure, initial encounter: Secondary | ICD-10-CM | POA: Diagnosis present

## 2017-06-10 DIAGNOSIS — Y836 Removal of other organ (partial) (total) as the cause of abnormal reaction of the patient, or of later complication, without mention of misadventure at the time of the procedure: Secondary | ICD-10-CM | POA: Diagnosis present

## 2017-06-10 DIAGNOSIS — D649 Anemia, unspecified: Secondary | ICD-10-CM | POA: Diagnosis present

## 2017-06-10 DIAGNOSIS — F419 Anxiety disorder, unspecified: Secondary | ICD-10-CM | POA: Diagnosis present

## 2017-06-10 DIAGNOSIS — R6520 Severe sepsis without septic shock: Secondary | ICD-10-CM

## 2017-06-10 DIAGNOSIS — T8143XA Infection following a procedure, organ and space surgical site, initial encounter: Secondary | ICD-10-CM | POA: Diagnosis present

## 2017-06-10 DIAGNOSIS — R51 Headache: Secondary | ICD-10-CM

## 2017-06-10 DIAGNOSIS — N179 Acute kidney failure, unspecified: Secondary | ICD-10-CM | POA: Diagnosis present

## 2017-06-10 DIAGNOSIS — R42 Dizziness and giddiness: Secondary | ICD-10-CM | POA: Diagnosis present

## 2017-06-10 DIAGNOSIS — K219 Gastro-esophageal reflux disease without esophagitis: Secondary | ICD-10-CM | POA: Diagnosis present

## 2017-06-10 DIAGNOSIS — A419 Sepsis, unspecified organism: Secondary | ICD-10-CM

## 2017-06-10 DIAGNOSIS — R519 Headache, unspecified: Secondary | ICD-10-CM

## 2017-06-10 DIAGNOSIS — K5720 Diverticulitis of large intestine with perforation and abscess without bleeding: Secondary | ICD-10-CM

## 2017-06-10 LAB — CBC
HEMATOCRIT: 20.8 % — AB (ref 36.0–46.0)
Hemoglobin: 6.8 g/dL — CL (ref 12.0–15.0)
MCH: 28.5 pg (ref 26.0–34.0)
MCHC: 32.7 g/dL (ref 30.0–36.0)
MCV: 87 fL (ref 78.0–100.0)
Platelets: 627 10*3/uL — ABNORMAL HIGH (ref 150–400)
RBC: 2.39 MIL/uL — ABNORMAL LOW (ref 3.87–5.11)
RDW: 15.1 % (ref 11.5–15.5)
WBC: 14.3 10*3/uL — AB (ref 4.0–10.5)

## 2017-06-10 LAB — HEPATIC FUNCTION PANEL
ALBUMIN: 2.1 g/dL — AB (ref 3.5–5.0)
ALT: 21 U/L (ref 14–54)
AST: 31 U/L (ref 15–41)
Alkaline Phosphatase: 255 U/L — ABNORMAL HIGH (ref 38–126)
Bilirubin, Direct: 0.2 mg/dL (ref 0.1–0.5)
Indirect Bilirubin: 0.4 mg/dL (ref 0.3–0.9)
Total Bilirubin: 0.6 mg/dL (ref 0.3–1.2)
Total Protein: 6.6 g/dL (ref 6.5–8.1)

## 2017-06-10 LAB — CBC WITH DIFFERENTIAL/PLATELET
BASOS ABS: 0 10*3/uL (ref 0.0–0.1)
Basophils Relative: 0 %
EOS PCT: 1 %
Eosinophils Absolute: 0.2 10*3/uL (ref 0.0–0.7)
HEMATOCRIT: 22.4 % — AB (ref 36.0–46.0)
Hemoglobin: 7.4 g/dL — ABNORMAL LOW (ref 12.0–15.0)
Lymphocytes Relative: 18 %
Lymphs Abs: 2.7 10*3/uL (ref 0.7–4.0)
MCH: 28.5 pg (ref 26.0–34.0)
MCHC: 33 g/dL (ref 30.0–36.0)
MCV: 86.2 fL (ref 78.0–100.0)
MONOS PCT: 10 %
Monocytes Absolute: 1.5 10*3/uL — ABNORMAL HIGH (ref 0.1–1.0)
NEUTROS PCT: 71 %
Neutro Abs: 10.6 10*3/uL — ABNORMAL HIGH (ref 1.7–7.7)
PLATELETS: 628 10*3/uL — AB (ref 150–400)
RBC: 2.6 MIL/uL — AB (ref 3.87–5.11)
RDW: 15.2 % (ref 11.5–15.5)
WBC: 15 10*3/uL — AB (ref 4.0–10.5)

## 2017-06-10 LAB — URINALYSIS, ROUTINE W REFLEX MICROSCOPIC
BACTERIA UA: NONE SEEN
Bilirubin Urine: NEGATIVE
Glucose, UA: NEGATIVE mg/dL
KETONES UR: NEGATIVE mg/dL
Leukocytes, UA: NEGATIVE
Nitrite: NEGATIVE
PH: 5 (ref 5.0–8.0)
PROTEIN: NEGATIVE mg/dL
Specific Gravity, Urine: 1.011 (ref 1.005–1.030)

## 2017-06-10 LAB — PROTIME-INR
INR: 1.04
Prothrombin Time: 13.5 seconds (ref 11.4–15.2)

## 2017-06-10 LAB — PREPARE RBC (CROSSMATCH)

## 2017-06-10 LAB — I-STAT CG4 LACTIC ACID, ED: LACTIC ACID, VENOUS: 0.84 mmol/L (ref 0.5–1.9)

## 2017-06-10 LAB — CREATININE, SERUM: CREATININE: 0.94 mg/dL (ref 0.44–1.00)

## 2017-06-10 IMAGING — CT CT IMAGE GUIDED FLUID DRAIN BY CATHETER
1 of 4 series · 11 of 32 positions shown, 17 images · non-contrast
Comparison: none

INDICATION: 56-year-old with history a partial small-bowel resection and lysis
of adhesions related to a small bowel perforation. Patient has
developed purulent output from an old surgical drain site. Recent CT
imaging demonstrates a complex abscess in the lower abdomen and
pelvis.

[Series 2: i-spiral 5.0 b31f · axial · 0.98mm/px · z∈[-543,-438]mm · 11 of 38 slices shown, 17 images]
[im 4/38  soft-tissue]
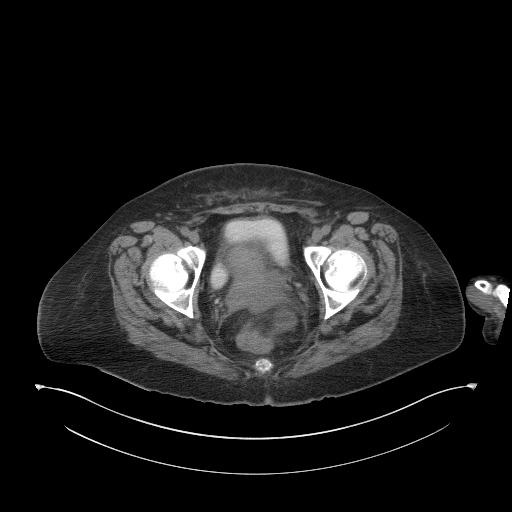
[im 4/38  bone]
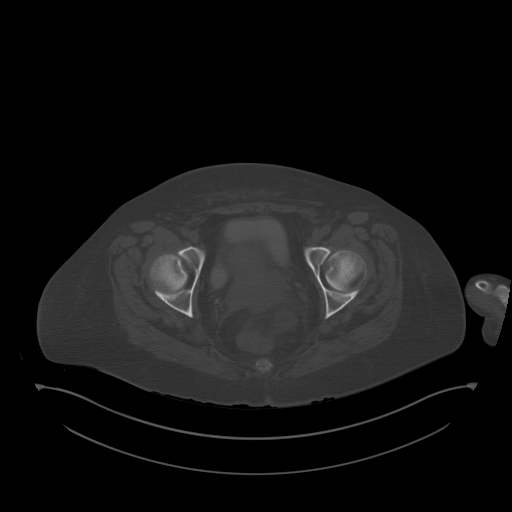
[im 7/38  soft-tissue]
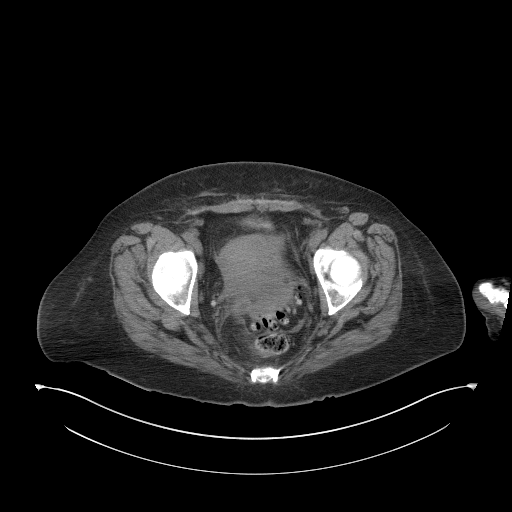
[im 10/38  soft-tissue]
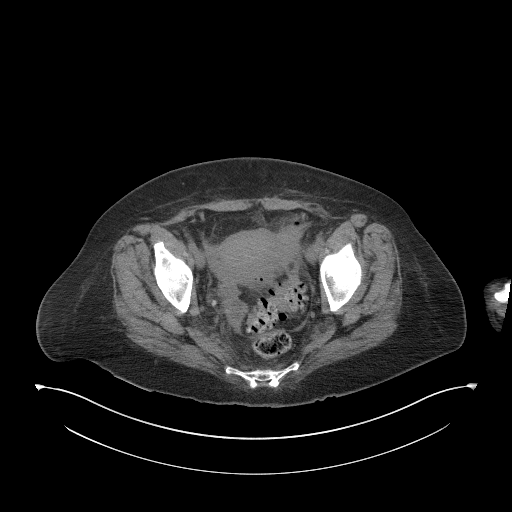
[im 13/38  soft-tissue]
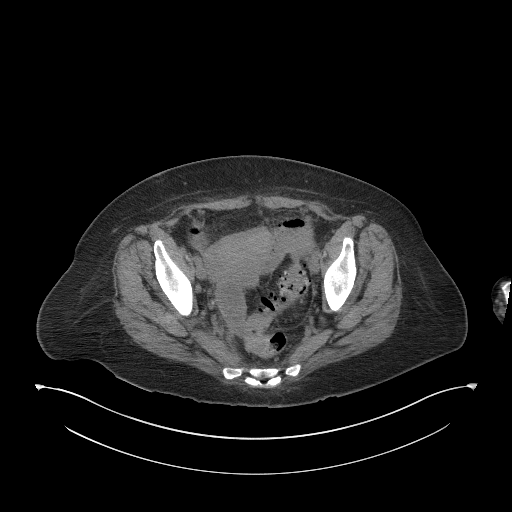
[im 16/38  soft-tissue]
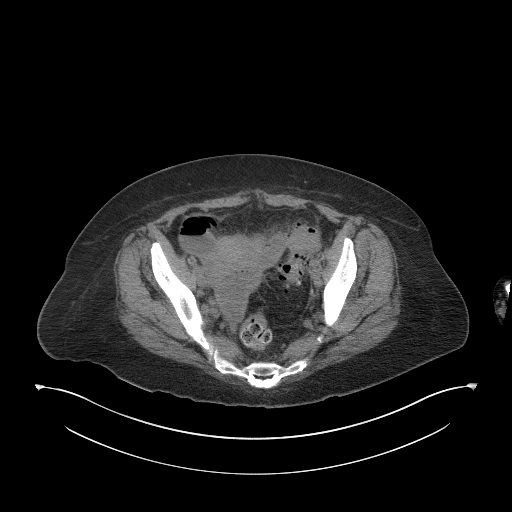
[im 19/38  soft-tissue]
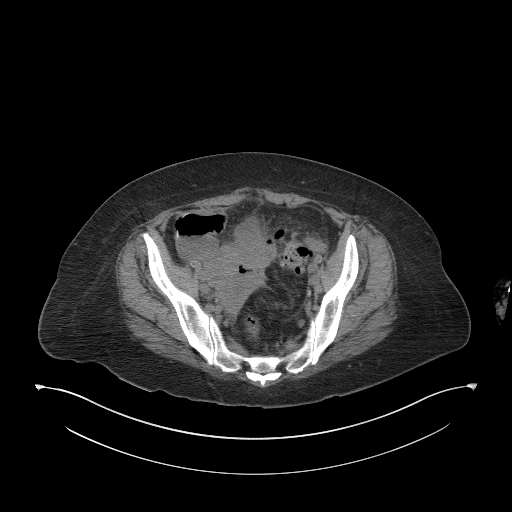
[im 22/38  soft-tissue]
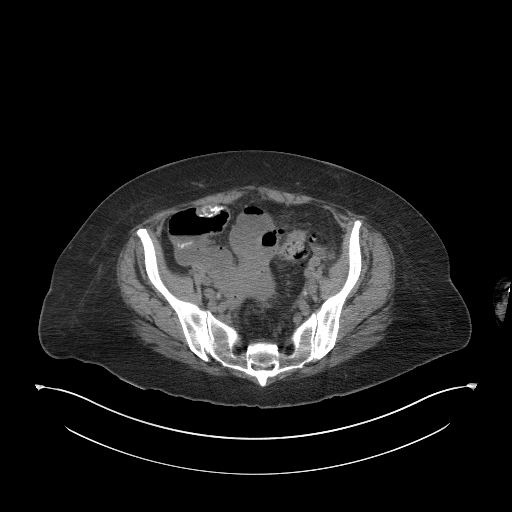
[im 25/38  soft-tissue]
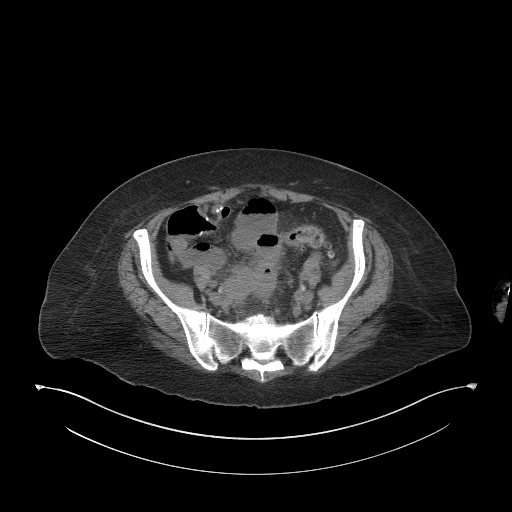
[im 25/38  lung]
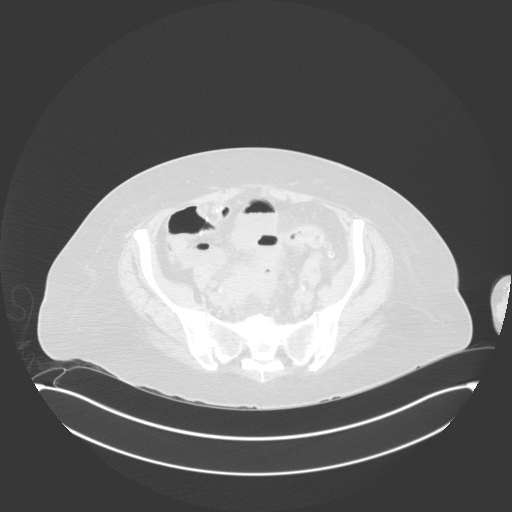
[im 28/38  soft-tissue]
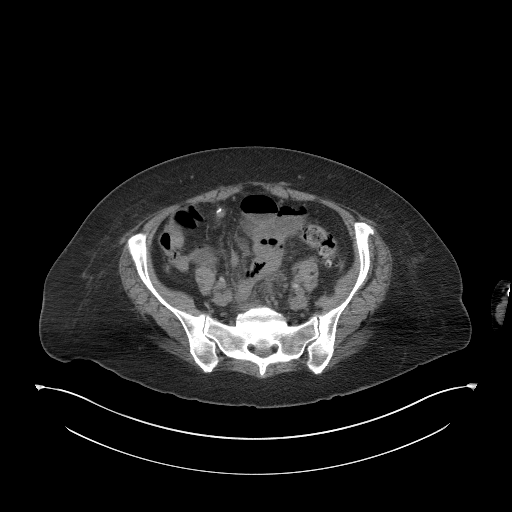
[im 28/38  lung]
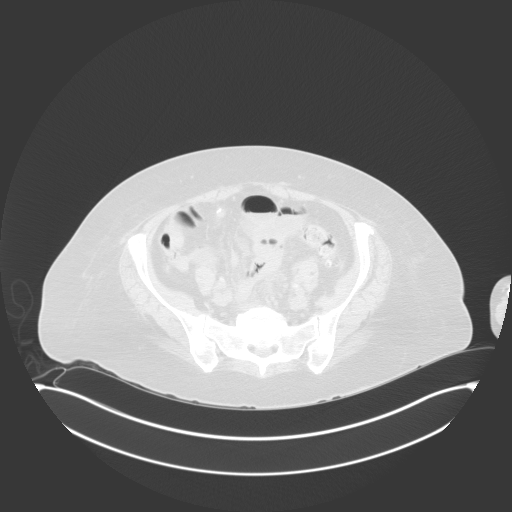
[im 28/38  bone]
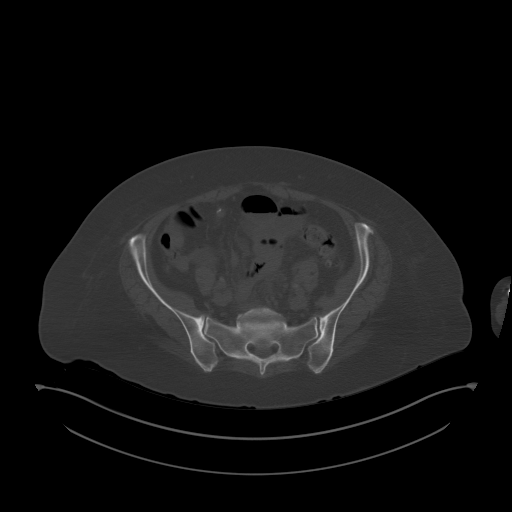
[im 31/38  soft-tissue]
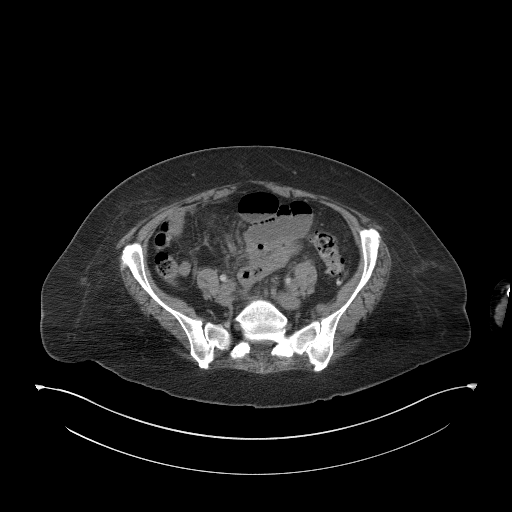
[im 31/38  lung]
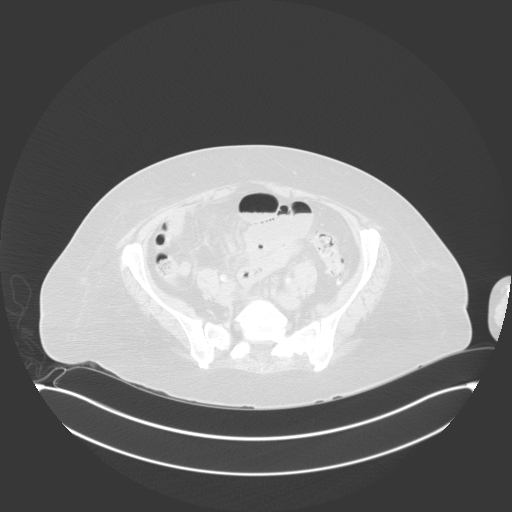
[im 34/38  soft-tissue]
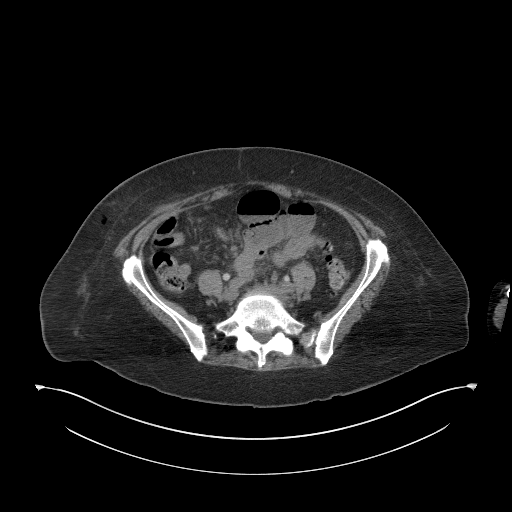
[im 34/38  lung]
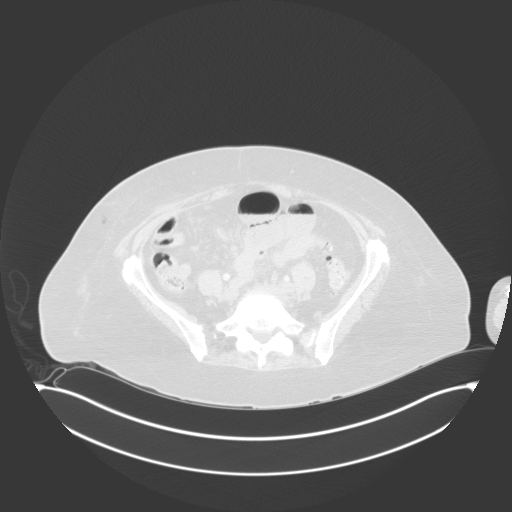

[11 of 32 positions shown; findings below may reference images not displayed]

EXAM:
CT-GUIDED DRAIN PLACEMENT WITHIN THE ABDOMINAL/PELVIC ABSCESS
COLLECTION

MEDICATIONS:
The patient is currently admitted to the hospital and receiving
intravenous antibiotics. The antibiotics were administered within an
appropriate time frame prior to the initiation of the procedure.

ANESTHESIA/SEDATION:
Fentanyl 100 mcg IV; Versed 4.0 mg IV

Moderate Sedation Time:  30 minutes

The patient was continuously monitored during the procedure by the
interventional radiology nurse under my direct supervision.

COMPLICATIONS:
None immediate.

PROCEDURE:
Informed written consent was obtained from the patient after a
thorough discussion of the procedural risks, benefits and
alternatives. All questions were addressed. A timeout was performed
prior to the initiation of the procedure.

Patient was placed supine on the CT scanner. Images through the
lower abdomen and pelvis were obtained. Gas containing collection in
the lower abdomen and pelvis was identified. The lower abdomen was
prepped with chlorhexidine and sterile field was created. Skin and
soft tissues were anesthetized with 1% lidocaine. An 18 gauge
coaxial needle was directed into this complex air-fluid collection
with CT guidance. Gas and purulent yellow fluid was aspirated. Stiff
Amplatz wire was advanced into the collection. Tract was dilated to
accommodate a 10 French drain. Approximately 15 mL of yellow
purulent fluid was aspirated along with gas. Drain was secured to
the skin with a suture and adhesive device. Drain was attached to a
suction bulb. Fluid sample was sent for culture.
FINDINGS: Complex air-fluid collection in the lower abdomen and pelvis
adjacent to the uterus and left adnexa. The wire and drain coiled
into a small cavity within the central abdomen. Wire and catheter
did not cross the midline into the more posterior aspect of the
collection.
IMPRESSION: CT-guided placement of a drainage catheter within the abscess in the
lower abdomen/pelvis. Combination of purulent yellow fluid and gas
was removed from this collection.

There is concern that the drain collection may not communicate with
the collection along the right pelvis. Recommend close follow-up
imaging to ensure adequate drainage from this catheter.

## 2017-06-10 IMAGING — CT CT ABD-PELV W/ CM
2 of 5 series · 15 of 46 positions shown, 17 images · IV contrast (ISOVUE)
Comparison: 05/20/2017

CLINICAL DATA: Dizziness and hypotension since removal of drain 3
days ago. White cell count 18.9.

EXAM:
CT ABDOMEN AND PELVIS WITH CONTRAST
TECHNIQUE: Multidetector CT imaging of the abdomen and pelvis was performed
using the standard protocol following bolus administration of
intravenous contrast.
CONTRAST:  80mL 2SPLZ7-F99 IOPAMIDOL (2SPLZ7-F99) INJECTION 61%

[Series 2: axial st · axial · 0.63mm/px · z∈[-484,-109]mm · 12 of 87 slices shown, 14 images]
[im 6/87  soft-tissue]
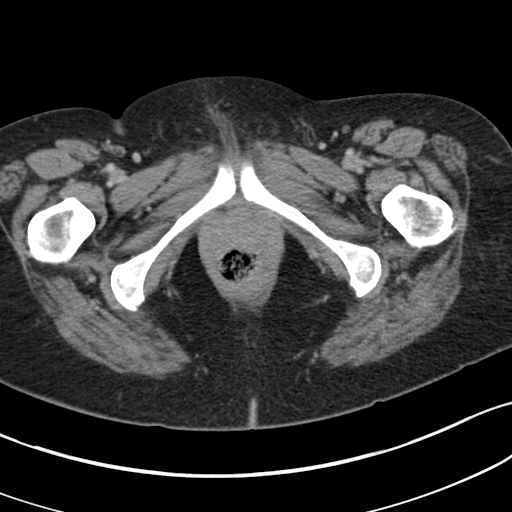
[im 6/87  bone]
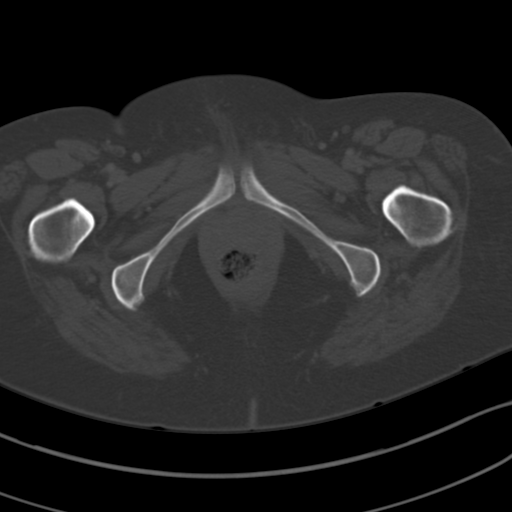
[im 16/87  soft-tissue]
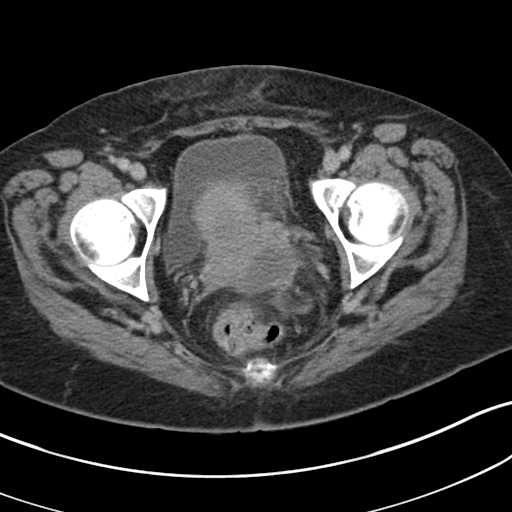
[im 21/87  soft-tissue]
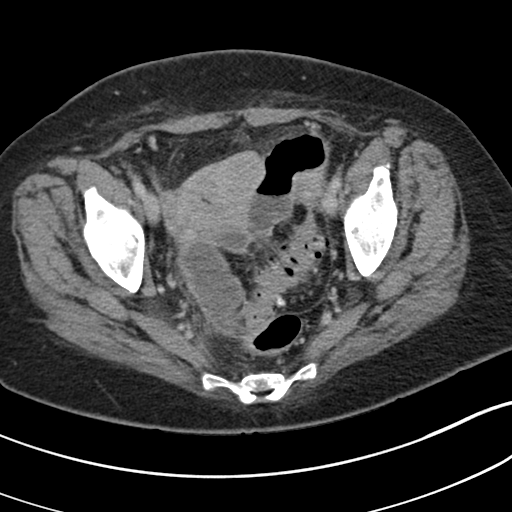
[im 26/87  soft-tissue]
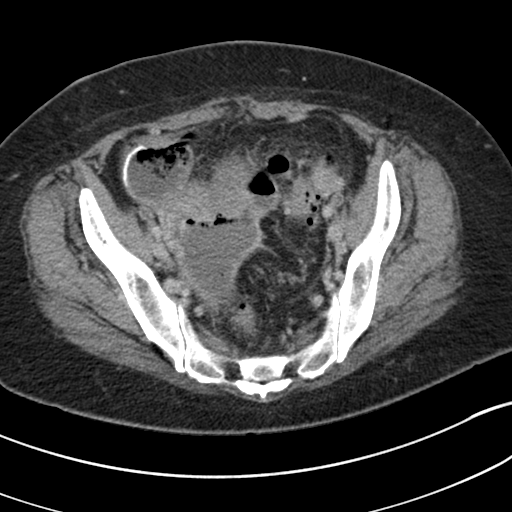
[im 36/87  soft-tissue]
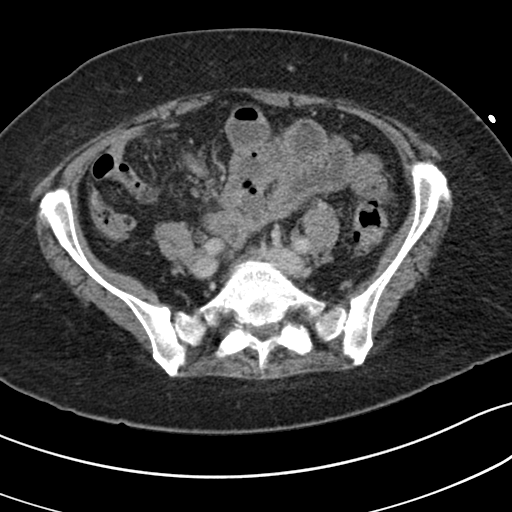
[im 41/87  soft-tissue]
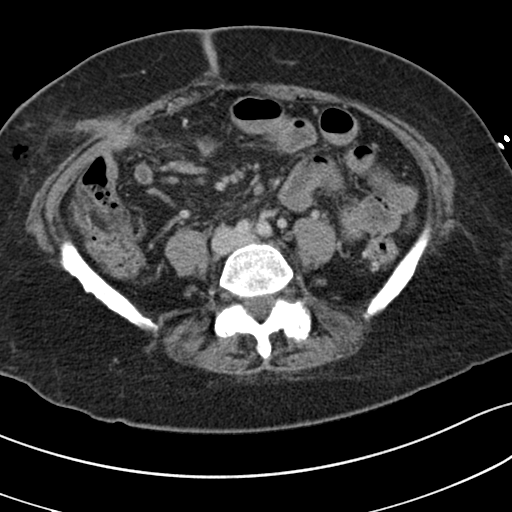
[im 46/87  soft-tissue]
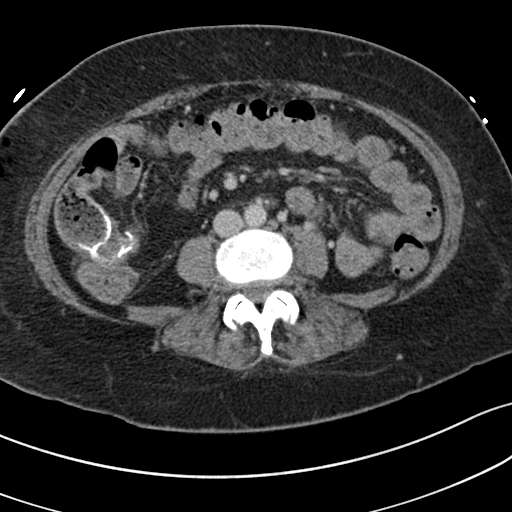
[im 56/87  soft-tissue]
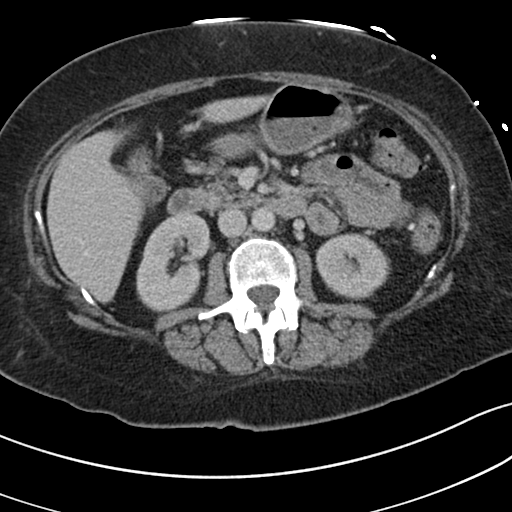
[im 61/87  soft-tissue]
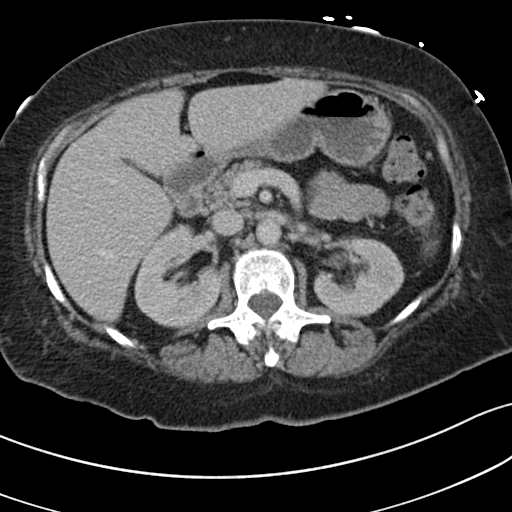
[im 61/87  bone]
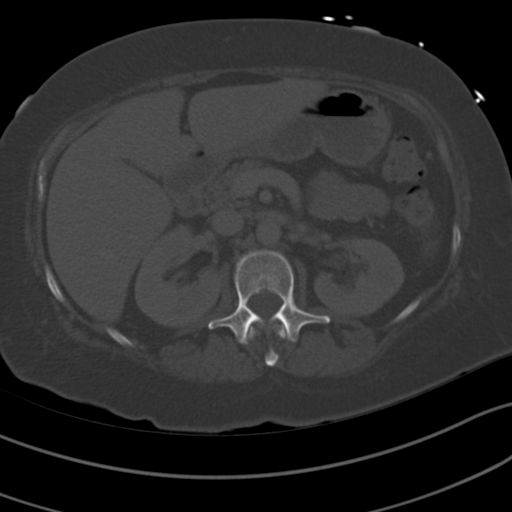
[im 66/87  soft-tissue]
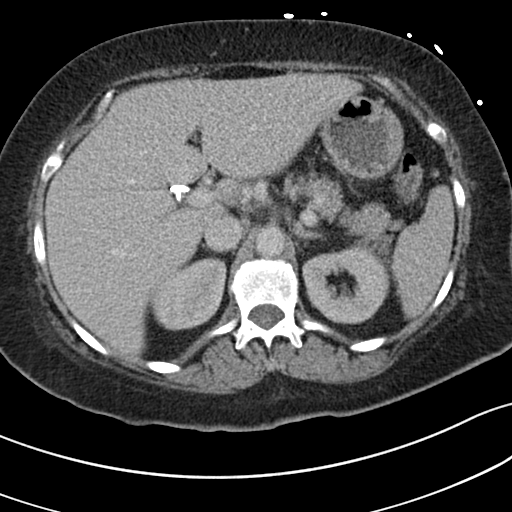
[im 76/87  soft-tissue]
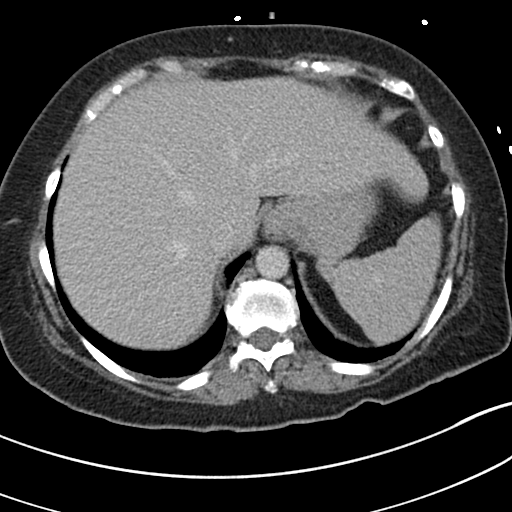
[im 81/87  soft-tissue]
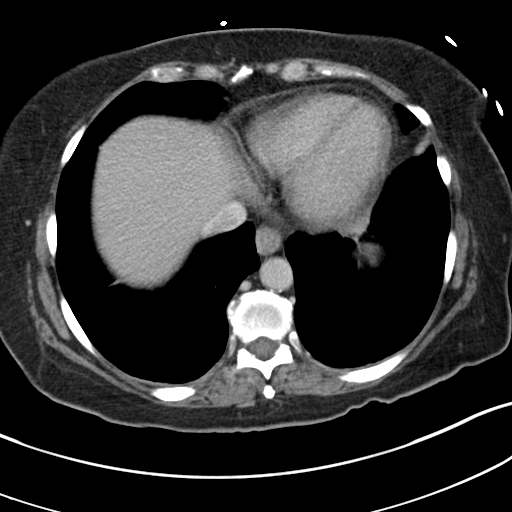

[Series 4: coronal st · coronal · 0.60mm/px · 3 of 81 slices shown]
[im 27/81  soft-tissue]
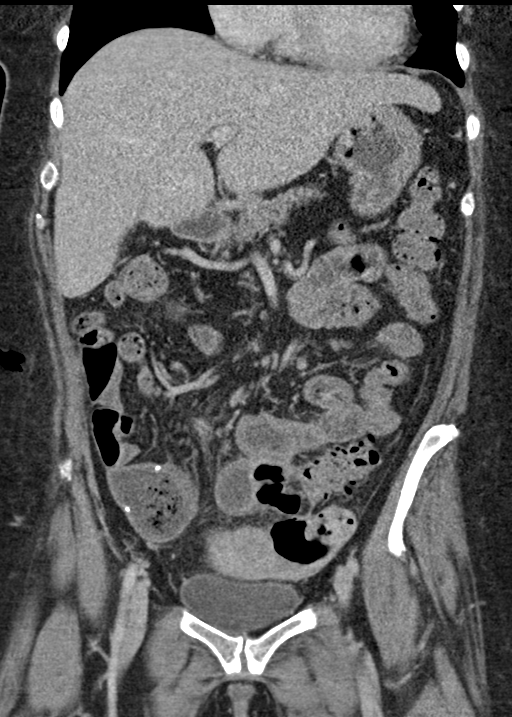
[im 36/81  soft-tissue]
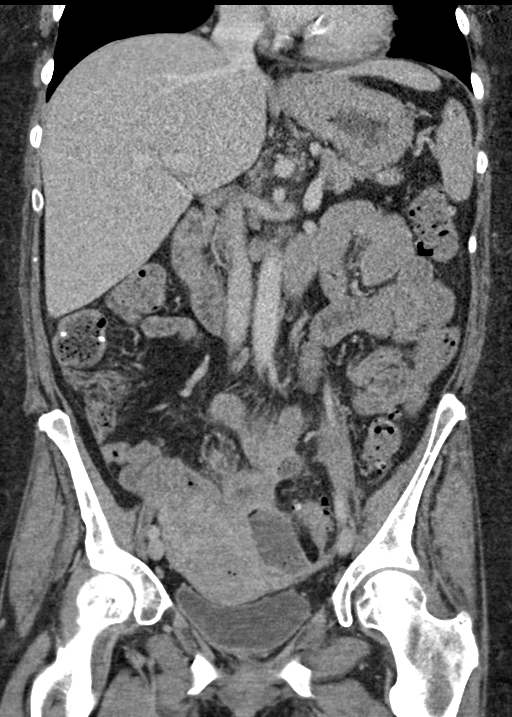
[im 45/81  soft-tissue]
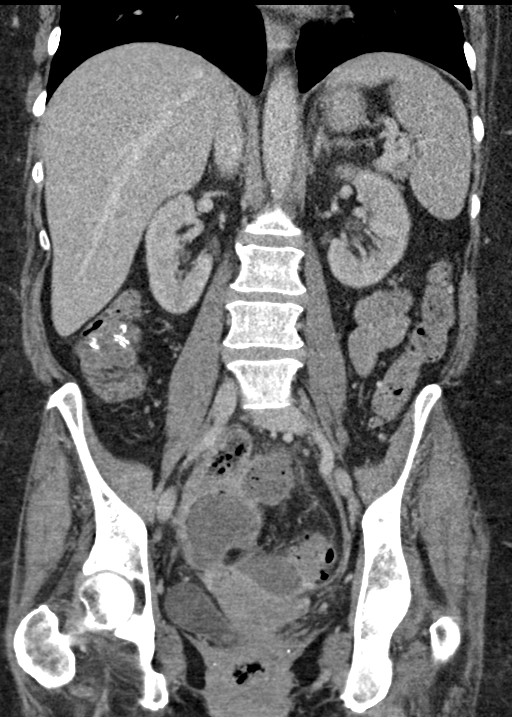

[15 of 46 positions shown; findings below may reference images not displayed]

FINDINGS: Lower chest: Lung bases are clear.

Hepatobiliary: No focal liver abnormality is seen. Status post
cholecystectomy. No biliary dilatation.

Pancreas: Unremarkable. No pancreatic ductal dilatation or
surrounding inflammatory changes.

Spleen: Normal in size without focal abnormality.

Adrenals/Urinary Tract: Adrenal glands are unremarkable. Kidneys are
normal, without renal calculi, focal lesion, or hydronephrosis.
Bladder is unremarkable.

Stomach/Bowel: Stomach, small bowel, and colon are not abnormally
distended. Surgical anastomoses demonstrated in the right: And right
lower quadrant bowel. Diverticulosis of the colon. Since the
previous study a trans sciatic drainage catheter has been removed.
There appears to be recurrent loculated fluid, likely abscess
arising in the previous catheter site and along the low pelvis.
Fluid collection measures about 3.7 x 11.7 cm. There is a new sinus
tract versus ileostomy in the right lower quadrant. This is
decompressed. Subcutaneous emphysema adjacent to the ileostomy.

Vascular/Lymphatic: No significant vascular findings are present. No
enlarged abdominal or pelvic lymph nodes.

Reproductive: Uterus and bilateral adnexa are unremarkable.

Other: No abdominal wall hernia or abnormality. No abdominopelvic
ascites.

Musculoskeletal: No acute or significant osseous findings.
IMPRESSION: 1. Recurrent abscess in the right low pelvis post catheter removal.
2. New since the previous study, there is a sinus tract versus
ileostomy extending to the right lower quadrant with adjacent
subcutaneous gas. Correlation with surgical history recommended.
3. Diverticulosis of the sigmoid colon.  No bowel obstruction.

## 2017-06-10 MED ORDER — LOSARTAN POTASSIUM 50 MG PO TABS: 100.0000 mg | ORAL_TABLET | Freq: Every day | ORAL | Status: DC

## 2017-06-10 MED ORDER — ONDANSETRON HCL 4 MG/2ML IJ SOLN: 4.0000 mg | Freq: Three times a day (TID) | INTRAMUSCULAR | Status: DC | PRN

## 2017-06-10 MED ORDER — MORPHINE SULFATE (PF) 2 MG/ML IV SOLN
2.0000 mg | INTRAVENOUS | Status: DC | PRN
  Administered 2017-06-10 – 2017-06-11 (×5): 2 mg via INTRAVENOUS
  Filled 2017-06-10 (×5): qty 1

## 2017-06-10 MED ORDER — MORPHINE SULFATE (PF) 4 MG/ML IV SOLN
4.0000 mg | Freq: Once | INTRAVENOUS | Status: AC
  Administered 2017-06-10: 4 mg via INTRAVENOUS
  Filled 2017-06-10: qty 1

## 2017-06-10 MED ORDER — MORPHINE SULFATE (PF) 4 MG/ML IV SOLN: 4.0000 mg | INTRAVENOUS | Status: DC | PRN

## 2017-06-10 MED ORDER — ENOXAPARIN SODIUM 40 MG/0.4ML ~~LOC~~ SOLN: 40.0000 mg | SUBCUTANEOUS | Status: DC

## 2017-06-10 MED ORDER — KCL IN DEXTROSE-NACL 20-5-0.45 MEQ/L-%-% IV SOLN
INTRAVENOUS | Status: DC
  Administered 2017-06-10 – 2017-06-11 (×2): via INTRAVENOUS
  Filled 2017-06-10 (×3): qty 1000

## 2017-06-10 MED ORDER — LIDOCAINE HCL (PF) 1 % IJ SOLN
INTRAMUSCULAR | Status: AC | PRN
  Administered 2017-06-10: 30 mL

## 2017-06-10 MED ORDER — PIPERACILLIN-TAZOBACTAM 3.375 G IVPB
3.3750 g | Freq: Three times a day (TID) | INTRAVENOUS | Status: DC
  Administered 2017-06-10 – 2017-06-11 (×5): 3.375 g via INTRAVENOUS
  Filled 2017-06-10 (×6): qty 50

## 2017-06-10 MED ORDER — MIDAZOLAM HCL 2 MG/2ML IJ SOLN
INTRAMUSCULAR | Status: AC
  Administered 2017-06-10: 17:00:00
  Filled 2017-06-10: qty 4

## 2017-06-10 MED ORDER — ALPRAZOLAM 0.5 MG PO TABS
1.0000 mg | ORAL_TABLET | Freq: Every evening | ORAL | Status: DC | PRN
  Administered 2017-06-11 (×2): 1 mg via ORAL
  Filled 2017-06-10 (×2): qty 2

## 2017-06-10 MED ORDER — ONDANSETRON 4 MG PO TBDP: 4.0000 mg | ORAL_TABLET | Freq: Four times a day (QID) | ORAL | Status: DC | PRN

## 2017-06-10 MED ORDER — FENTANYL CITRATE (PF) 100 MCG/2ML IJ SOLN
INTRAMUSCULAR | Status: AC
  Administered 2017-06-10: 17:00:00
  Filled 2017-06-10: qty 2

## 2017-06-10 MED ORDER — OXYCODONE HCL 5 MG PO TABS
5.0000 mg | ORAL_TABLET | ORAL | Status: DC | PRN
Start: 2017-06-10 — End: 2017-06-10
  Administered 2017-06-10: 5 mg via ORAL
  Filled 2017-06-10: qty 1

## 2017-06-10 MED ORDER — ACETAMINOPHEN 650 MG RE SUPP: 650.0000 mg | Freq: Four times a day (QID) | RECTAL | Status: DC | PRN

## 2017-06-10 MED ORDER — DIPHENHYDRAMINE HCL 25 MG PO CAPS: 25.0000 mg | ORAL_CAPSULE | Freq: Four times a day (QID) | ORAL | Status: DC | PRN

## 2017-06-10 MED ORDER — PIPERACILLIN-TAZOBACTAM 3.375 G IVPB 30 MIN
3.3750 g | Freq: Once | INTRAVENOUS | Status: AC
  Administered 2017-06-10: 3.375 g via INTRAVENOUS
  Filled 2017-06-10: qty 50

## 2017-06-10 MED ORDER — SODIUM CHLORIDE 0.9 % IV SOLN
Freq: Once | INTRAVENOUS | Status: AC
  Administered 2017-06-10: 15:00:00 via INTRAVENOUS

## 2017-06-10 MED ORDER — FENTANYL CITRATE (PF) 100 MCG/2ML IJ SOLN
INTRAMUSCULAR | Status: AC | PRN
  Administered 2017-06-10 (×2): 50 ug via INTRAVENOUS

## 2017-06-10 MED ORDER — ONDANSETRON HCL 4 MG/2ML IJ SOLN
4.0000 mg | Freq: Once | INTRAMUSCULAR | Status: AC
  Administered 2017-06-10: 4 mg via INTRAVENOUS
  Filled 2017-06-10: qty 2

## 2017-06-10 MED ORDER — OXYCODONE HCL 5 MG PO TABS
5.0000 mg | ORAL_TABLET | ORAL | Status: DC | PRN
  Administered 2017-06-10 – 2017-06-11 (×2): 10 mg via ORAL
  Filled 2017-06-10 (×2): qty 2

## 2017-06-10 MED ORDER — SODIUM CHLORIDE 0.9 % IV BOLUS
1000.0000 mL | Freq: Once | INTRAVENOUS | Status: AC
  Administered 2017-06-10: 1000 mL via INTRAVENOUS

## 2017-06-10 MED ORDER — SODIUM CHLORIDE 0.9 % IV SOLN
INTRAVENOUS | Status: DC
  Administered 2017-06-10: 04:00:00 via INTRAVENOUS

## 2017-06-10 MED ORDER — DIPHENHYDRAMINE HCL 50 MG/ML IJ SOLN: 25.0000 mg | Freq: Four times a day (QID) | INTRAMUSCULAR | Status: DC | PRN

## 2017-06-10 MED ORDER — IOPAMIDOL (ISOVUE-300) INJECTION 61%
100.0000 mL | Freq: Once | INTRAVENOUS | Status: AC | PRN
  Administered 2017-06-10: 80 mL via INTRAVENOUS

## 2017-06-10 MED ORDER — SODIUM CHLORIDE 0.9 % IV BOLUS (SEPSIS)
1250.0000 mL | Freq: Once | INTRAVENOUS | Status: AC
  Administered 2017-06-10: 1250 mL via INTRAVENOUS

## 2017-06-10 MED ORDER — MIDAZOLAM HCL 2 MG/2ML IJ SOLN
INTRAMUSCULAR | Status: AC | PRN
  Administered 2017-06-10 (×4): 1 mg via INTRAVENOUS

## 2017-06-10 MED ORDER — IOPAMIDOL (ISOVUE-300) INJECTION 61%
INTRAVENOUS | Status: AC
  Filled 2017-06-10: qty 100

## 2017-06-10 MED ORDER — PANTOPRAZOLE SODIUM 40 MG PO TBEC
40.0000 mg | DELAYED_RELEASE_TABLET | Freq: Every day | ORAL | Status: DC
  Administered 2017-06-11: 40 mg via ORAL
  Filled 2017-06-10 (×2): qty 1

## 2017-06-10 MED ORDER — SODIUM CHLORIDE 0.9% FLUSH
5.0000 mL | Freq: Three times a day (TID) | INTRAVENOUS | Status: DC
  Administered 2017-06-10 – 2017-06-12 (×5): 5 mL

## 2017-06-10 MED ORDER — ONDANSETRON HCL 4 MG/2ML IJ SOLN
4.0000 mg | Freq: Four times a day (QID) | INTRAMUSCULAR | Status: DC | PRN
  Administered 2017-06-11: 4 mg via INTRAVENOUS
  Filled 2017-06-10: qty 2

## 2017-06-10 MED ORDER — ACETAMINOPHEN 325 MG PO TABS
650.0000 mg | ORAL_TABLET | Freq: Four times a day (QID) | ORAL | Status: DC | PRN
  Administered 2017-06-11: 650 mg via ORAL
  Filled 2017-06-10 (×2): qty 2

## 2017-06-10 MED ORDER — LOSARTAN POTASSIUM-HCTZ 100-25 MG PO TABS: 1.0000 | ORAL_TABLET | Freq: Every day | ORAL | Status: DC

## 2017-06-10 MED ORDER — HYDROCHLOROTHIAZIDE 25 MG PO TABS: 25.0000 mg | ORAL_TABLET | Freq: Every day | ORAL | Status: DC

## 2017-06-10 MED ORDER — METOPROLOL TARTRATE 5 MG/5ML IV SOLN: 5.0000 mg | Freq: Four times a day (QID) | INTRAVENOUS | Status: DC | PRN

## 2017-06-10 NOTE — Progress Notes (Signed)
Gave handoff report to Kim Oka RN about pt's blood and SBAR

## 2017-06-10 NOTE — ED Notes (Signed)
ED TO INPATIENT HANDOFF REPORT  Name/Age/Gender Kim Snow 57 y.o. female  Code Status Code Status History    Date Active Date Inactive Code Status Order ID Comments User Context   05/21/2017 1737 05/29/2017 1333 Full Code [id number]  [facility]   05/05/2017 1952 05/11/2017 1814 Full Code [id number]  Greer Pickerel, MD ED      Home/SNF/Other Home  Chief Complaint low blood pressure  Level of Care/Admitting Diagnosis ED Disposition    ED Disposition Condition [facility] Area: [facility] [[id number]]  Level of Care: Telemetry [5]  Admit to tele based on following criteria: Other see comments  Comments: Dizziness; tachycardia  Diagnosis: Postprocedural intraabdominal abscess [[id number]]  Admitting Physician: Mickeal Skinner [[id number]]  Attending Physician: Mickeal Skinner [[id number]]  Estimated length of stay: past midnight tomorrow  Certification:: I certify this patient will need inpatient services for at least 2 midnights  PT Class (Do Not Modify): Inpatient [101]  PT Acc Code (Do Not Modify): Private [1]       Medical History Past Medical History:  Diagnosis Date  . Anemia   . Anxiety   . Asthma   . Depression   . GERD (gastroesophageal reflux disease)   . Hypertension   . Insomnia   . Migraine     Allergies No Known Allergies  IV Location/Drains/Wounds Patient Lines/Drains/Airways Status   Active Line/Drains/Airways    Name:   Placement date:   Placement time:   Site:   Days:   Peripheral IV 06/10/17 Left Antecubital   06/10/17    0054    Antecubital   less than 1   Peripheral IV 06/10/17 Right Antecubital   06/10/17    0135    Antecubital   less than 1   Closed System Drain Right;Midline Abdomen Bulb (JP) 19 Fr.   05/22/17    1342    Abdomen   19   Negative Pressure Wound Therapy Abdomen Right   05/22/17    1601    -   19   Incision (Closed) 05/22/17 Abdomen Other (Comment)   05/22/17    1607      19   Incision - 4 Ports Abdomen Right;Upper Right;Lower Left;Upper Left;Lower   05/22/17    1340     19          Labs/Imaging Results for orders placed or performed during the hospital encounter of 06/10/17 (from the past 48 hour(s))  Basic metabolic panel     Status: Abnormal   Collection Time: 06/09/17  5:10 PM  Result Value Ref Range   Sodium 134 (L) 135 - 145 mmol/L   Potassium 3.8 3.5 - 5.1 mmol/L   Chloride 97 (L) 101 - 111 mmol/L   CO2 24 22 - 32 mmol/L   Glucose, Bld 132 (H) 65 - 99 mg/dL   BUN 29 (H) 6 - 20 mg/dL   Creatinine, Ser 1.22 (H) 0.44 - 1.00 mg/dL   Calcium 9.2 8.9 - 10.3 mg/dL   GFR calc non Af Amer 48 (L) >60 mL/min   GFR calc Af Amer 56 (L) >60 mL/min    Comment: (NOTE) The eGFR has been calculated using the CKD EPI equation. This calculation has not been validated in all clinical situations. eGFR's persistently <60 mL/min signify possible Chronic Kidney Disease.    Anion gap 13 5 - 15    Comment: Performed at [facility], [place] [address]  [place], [place] [address]  CBC     Status: Abnormal   Collection Time: 06/09/17  5:10 PM  Result Value Ref Range   WBC 18.9 (H) 4.0 - 10.5 K/uL   RBC 2.88 (L) 3.87 - 5.11 MIL/uL   Hemoglobin 8.0 (L) 12.0 - 15.0 g/dL   HCT 24.9 (L) 36.0 - 46.0 %   MCV 86.5 78.0 - 100.0 fL   MCH 27.8 26.0 - 34.0 pg   MCHC 32.1 30.0 - 36.0 g/dL   RDW 14.9 11.5 - 15.5 %   Platelets 793 (H) 150 - 400 K/uL    Comment: Performed at [facility], [place] [address], [place], [place] [address]  I-Stat beta hCG blood, ED     Status: Abnormal   Collection Time: 06/09/17  5:15 PM  Result Value Ref Range   I-stat hCG, quantitative 5.2 (H) <5 mIU/mL   Comment 3            Comment:   GEST. AGE      CONC.  (mIU/mL)   <=1 WEEK        5 - 50     2 WEEKS       50 - 500     3 WEEKS       100 - 10,000     4 WEEKS     1,000 - 30,000        FEMALE AND NON-PREGNANT FEMALE:     LESS THAN 5 mIU/mL   Hepatic  function panel     Status: Abnormal   Collection Time: 06/10/17  1:31 AM  Result Value Ref Range   Total Protein 6.6 6.5 - 8.1 g/dL   Albumin 2.1 (L) 3.5 - 5.0 g/dL   AST 31 15 - 41 U/L   ALT 21 14 - 54 U/L   Alkaline Phosphatase 255 (H) 38 - 126 U/L   Total Bilirubin 0.6 0.3 - 1.2 mg/dL   Bilirubin, Direct 0.2 0.1 - 0.5 mg/dL   Indirect Bilirubin 0.4 0.3 - 0.9 mg/dL    Comment: Performed at [facility], [place] [address] [place], [place] [address]  CBC WITH DIFFERENTIAL     Status: Abnormal (Preliminary result)   Collection Time: 06/10/17  1:31 AM  Result Value Ref Range   WBC 15.0 (H) 4.0 - 10.5 K/uL   RBC 2.60 (L) 3.87 - 5.11 MIL/uL   Hemoglobin 7.4 (L) 12.0 - 15.0 g/dL   HCT 22.4 (L) 36.0 - 46.0 %   MCV 86.2 78.0 - 100.0 fL   MCH 28.5 26.0 - 34.0 pg   MCHC 33.0 30.0 - 36.0 g/dL   RDW 15.2 11.5 - 15.5 %   Platelets 628 (H) 150 - 400 K/uL    Comment: Performed at [facility], [place] [address] [place], [place] [address]   Neutrophils Relative % PENDING %   Neutro Abs PENDING 1.7 - 7.7 K/uL   Band Neutrophils PENDING %   Lymphocytes Relative PENDING %   Lymphs Abs PENDING 0.7 - 4.0 K/uL   Monocytes Relative PENDING %   Monocytes Absolute PENDING 0.1 - 1.0 K/uL   Eosinophils Relative PENDING %   Eosinophils Absolute PENDING 0.0 - 0.7 K/uL   Basophils Relative PENDING %   Basophils Absolute PENDING 0.0 - 0.1 K/uL   WBC Morphology PENDING    RBC Morphology PENDING    Smear Review PENDING    nRBC PENDING 0 /100 WBC   Metamyelocytes Relative PENDING %  Myelocytes PENDING %   Promyelocytes Relative PENDING %   Blasts PENDING %  I-Stat CG4 Lactic Acid, ED  (not at  [facility])     Status: None   Collection Time: 06/10/17  1:44 AM  Result Value Ref Range   Lactic Acid, Venous 0.84 0.5 - 1.9 mmol/L  Urinalysis, Routine w reflex microscopic     Status: Abnormal   Collection Time: 06/10/17  2:22 AM  Result Value Ref Range   Color, Urine STRAW  (A) YELLOW   APPearance CLEAR CLEAR   Specific Gravity, Urine 1.011 1.005 - 1.030   pH 5.0 5.0 - 8.0   Glucose, UA NEGATIVE NEGATIVE mg/dL   Hgb urine dipstick SMALL (A) NEGATIVE   Bilirubin Urine NEGATIVE NEGATIVE   Ketones, ur NEGATIVE NEGATIVE mg/dL   Protein, ur NEGATIVE NEGATIVE mg/dL   Nitrite NEGATIVE NEGATIVE   Leukocytes, UA NEGATIVE NEGATIVE   RBC / HPF 0-5 0 - 5 RBC/hpf   WBC, UA 0-5 0 - 5 WBC/hpf   Bacteria, UA NONE SEEN NONE SEEN   Squamous Epithelial / LPF 0-5 0 - 5    Comment: Please note change in reference range.   Mucus PRESENT     Comment: Performed at [facility] [address], [place], [place] [address]   Ct Abdomen Pelvis W Contrast  Result Date: 06/10/2017 CLINICAL DATA:  Dizziness and hypotension since removal of drain 3 days ago. White cell count 18.9. EXAM: CT ABDOMEN AND PELVIS WITH CONTRAST TECHNIQUE: Multidetector CT imaging of the abdomen and pelvis was performed using the standard protocol following bolus administration of intravenous contrast. CONTRAST:  99m ISOVUE-300 IOPAMIDOL (ISOVUE-300) INJECTION 61% COMPARISON:  05/20/2017 FINDINGS: Lower chest: Lung bases are clear. Hepatobiliary: No focal liver abnormality is seen. Status post cholecystectomy. No biliary dilatation. Pancreas: Unremarkable. No pancreatic ductal dilatation or surrounding inflammatory changes. Spleen: Normal in size without focal abnormality. Adrenals/Urinary Tract: Adrenal glands are unremarkable. Kidneys are normal, without renal calculi, focal lesion, or hydronephrosis. Bladder is unremarkable. Stomach/Bowel: Stomach, small bowel, and colon are not abnormally distended. Surgical anastomoses demonstrated in the right: And right lower quadrant bowel. Diverticulosis of the colon. Since the previous study a trans sciatic drainage catheter has been removed. There appears to be recurrent loculated fluid, likely abscess arising in the previous catheter site and along the  low pelvis. Fluid collection measures about 3.7 x 11.7 cm. There is a new sinus tract versus ileostomy in the right lower quadrant. This is decompressed. Subcutaneous emphysema adjacent to the ileostomy. Vascular/Lymphatic: No significant vascular findings are present. No enlarged abdominal or pelvic lymph nodes. Reproductive: Uterus and bilateral adnexa are unremarkable. Other: No abdominal wall hernia or abnormality. No abdominopelvic ascites. Musculoskeletal: No acute or significant osseous findings. IMPRESSION: 1. Recurrent abscess in the right low pelvis post catheter removal. 2. New since the previous study, there is a sinus tract versus ileostomy extending to the right lower quadrant with adjacent subcutaneous gas. Correlation with surgical history recommended. 3. Diverticulosis of the sigmoid colon.  No bowel obstruction. Electronically Signed   By: WLucienne CapersM.D.   On: 06/10/2017 02:28    Pending Labs Unresulted Labs (From admission, onward)   Start     Ordered   06/10/17 0109  Blood Culture (routine x 2)  BLOOD CULTURE X 2,   STAT     06/10/17 0109      Vitals/Pain Today's Vitals   06/10/17 0130 06/10/17 0300 06/10/17 0315 06/10/17 0330  BP: (!) 98/54 (Marland Kitchen  108/59  (!) 104/56  Pulse: (!) 109  (!) 107   Resp: '17 13 13 14  ' Temp:      TempSrc:      SpO2: 100%  100%   Weight:      Height:        Isolation Precautions No active isolations  Medications Medications  iopamidol (ISOVUE-300) 61 % injection (has no administration in time range)  0.9 %  sodium chloride infusion (has no administration in time range)  ondansetron (ZOFRAN) injection 4 mg (has no administration in time range)  morphine 4 MG/ML injection 4 mg (has no administration in time range)  sodium chloride 0.9 % bolus 1,000 mL (0 mLs Intravenous Stopped 06/10/17 0130)  sodium chloride 0.9 % bolus 1,250 mL (1,250 mLs Intravenous New Bag/Given 06/10/17 0200)  piperacillin-tazobactam (ZOSYN) IVPB 3.375 g (0 g  Intravenous Stopped 06/10/17 0210)  iopamidol (ISOVUE-300) 61 % injection 100 mL (80 mLs Intravenous Contrast Given 06/10/17 0148)  morphine 4 MG/ML injection 4 mg (4 mg Intravenous Given 06/10/17 0253)  ondansetron (ZOFRAN) injection 4 mg (4 mg Intravenous Given 06/10/17 0254)    Mobility walks

## 2017-06-10 NOTE — ED Notes (Signed)
Gave report to [employer] RN

## 2017-06-10 NOTE — Progress Notes (Addendum)
Patient ID: Kim Snow, female   DOB: 11-Aug-1960, 57 y.o.   MRN: [id number]    Referring Physician(s): Kinsinger,L  Supervising Physician: Richarda Overlie  Patient Status:  [facility] - In-pt  Chief Complaint: Recurrent abdominal abscess  Subjective: Patient familiar to IR service from prior pelvic abscess drainage (right transgluteal approach) on 05/06/2017 with removal of drain on 05/20/2017.  At that time there was no fistula noted and abscess was resolved by imaging.  Etiology of abscess at that time was felt to be secondary to localized small bowel perforation rather than sigmoid diverticulitis.  She subsequently underwent small bowel resection with anastomosis x2 as well as lysis of adhesions for small bowel perforation on 05/22/2017 with placement of a surgical JP drain.  JP drain was removed on 05/29/2017.  She now presents with drainage from the prior surgical JP drain site as well as intermittent fevers, leukocytosis and hypotension.  CT scan performed today revealed recurrent abscess in the right lower pelvis as well as a sinus tract extending to the right lower quadrant with adjacent subcutaneous gas.  Request now received from surgery for drainage of this recurrent right lower quadrant abscess.  She currently denies fever, headache, chest pain, dyspnea, cough, nausea, vomiting or bleeding. Past Medical History:  Diagnosis Date  . Anemia   . Anxiety   . Asthma   . Depression   . GERD (gastroesophageal reflux disease)   . Hypertension   . Insomnia   . Migraine    Past Surgical History:  Procedure Laterality Date  . CESAREAN SECTION    . CHOLECYSTECTOMY    . IR RADIOLOGIST EVAL & MGMT  05/20/2017  . KNEE ARTHROSCOPY    . LAPAROSCOPY N/A 05/22/2017   Procedure: LAPAROSCOPY DIAGNOSTIC, LYSIS OF ADHESIONS, DRAINAGE OF ABCESS, RIGID PROCTOSCOPY, SMALL BOWEL RESECTION X2;  Surgeon: Kinsinger, De Blanch, MD;  Location: [facility];  Service: General;  Laterality: N/A;      Allergies: Patient has no known allergies.  Medications: Prior to Admission medications   Medication Sig Start Date End Date Taking? Authorizing Provider  ALPRAZolam Prudy Feeler) 1 MG tablet Take 1 mg by mouth at bedtime as needed for sleep.  02/11/17  Yes [provider]  CALCIUM PO Take 1,000 mg by mouth daily.   Yes [provider]  Cyanocobalamin (VITAMIN B 12 PO) Take 1 tablet by mouth daily.   Yes [provider]  losartan-hydrochlorothiazide (HYZAAR) 100-25 MG tablet Take 1 tablet by mouth daily. 03/11/17  Yes [provider]  meloxicam (MOBIC) 15 MG tablet Take 15 mg by mouth daily at 12 noon. 04/21/17  Yes [provider]  omeprazole (PRILOSEC) 40 MG capsule Take 40 mg by mouth daily. 03/28/17  Yes [provider]  Polysaccharide Iron Complex (IFEREX 150 PO) Take 150 mg by mouth daily.   Yes [provider]  traMADol (ULTRAM) 50 MG tablet Take 1 tablet (50 mg total) by mouth every 6 (six) hours. Patient taking differently: Take 100 mg by mouth 2 (two) times daily.  05/29/17  Yes Focht, Joyce Copa, PA  acetaminophen (TYLENOL) 325 MG tablet Take 2 tablets (650 mg total) by mouth every 6 (six) hours as needed for mild pain (or temp > 100). Patient not taking: Reported on 06/10/2017 05/11/17   Berna Bue, MD  amoxicillin-clavulanate (AUGMENTIN) 875-125 MG tablet Take 1 tablet by mouth 2 (two) times daily.  06/09/17   [provider]  docusate sodium (COLACE) 100 MG capsule Take 1 capsule (100  mg total) by mouth 2 (two) times daily. Patient not taking: Reported on 06/10/2017 05/11/17   Berna Bue, MD  oxyCODONE (OXY IR/ROXICODONE) 5 MG immediate release tablet Take 1 tablet (5 mg total) by mouth every 6 (six) hours as needed (  for moderate pain,  for severe pain). Patient not taking: Reported on 06/10/2017 05/29/17   Jerre Simon, PA     Vital Signs: BP 114/71 (BP Location: Right Arm)   Pulse 98    Temp 98.7 F (37.1 C) (Oral)   Resp 18   Ht  (1.651 m)   Wt 159 lb 9.8 oz (72.4 kg)   SpO2 100%   BMI 26.56 kg/m   Physical Exam awake, alert.  Chest clear to auscultation bilaterally.  Heart with regular rate and rhythm.  Abdomen soft, few bowel sounds, mild right lower quadrant tenderness ,previous right lower quadrant drain site with purulent greenish-yellow drainage, midline anterior pelvic transverse incision site clean and dry.  No lower extremity edema.  Imaging: Ct Abdomen Pelvis W Contrast  Result Date: 06/10/2017 CLINICAL DATA:  Dizziness and hypotension since removal of drain 3 days ago. White cell count 18.9. EXAM: CT ABDOMEN AND PELVIS WITH CONTRAST TECHNIQUE: Multidetector CT imaging of the abdomen and pelvis was performed using the standard protocol following bolus administration of intravenous contrast. CONTRAST:  80mL ISOVUE-300 IOPAMIDOL (ISOVUE-300) INJECTION 61% COMPARISON:  05/20/2017 FINDINGS: Lower chest: Lung bases are clear. Hepatobiliary: No focal liver abnormality is seen. Status post cholecystectomy. No biliary dilatation. Pancreas: Unremarkable. No pancreatic ductal dilatation or surrounding inflammatory changes. Spleen: Normal in size without focal abnormality. Adrenals/Urinary Tract: Adrenal glands are unremarkable. Kidneys are normal, without renal calculi, focal lesion, or hydronephrosis. Bladder is unremarkable. Stomach/Bowel: Stomach, small bowel, and colon are not abnormally distended. Surgical anastomoses demonstrated in the right: And right lower quadrant bowel. Diverticulosis of the colon. Since the previous study a trans sciatic drainage catheter has been removed. There appears to be recurrent loculated fluid, likely abscess arising in the previous catheter site and along the low pelvis. Fluid collection measures about 3.7 x 11.7 cm. There is a new sinus tract versus ileostomy in the right lower quadrant. This is decompressed. Subcutaneous emphysema adjacent  to the ileostomy. Vascular/Lymphatic: No significant vascular findings are present. No enlarged abdominal or pelvic lymph nodes. Reproductive: Uterus and bilateral adnexa are unremarkable. Other: No abdominal wall hernia or abnormality. No abdominopelvic ascites. Musculoskeletal: No acute or significant osseous findings. IMPRESSION: 1. Recurrent abscess in the right low pelvis post catheter removal. 2. New since the previous study, there is a sinus tract versus ileostomy extending to the right lower quadrant with adjacent subcutaneous gas. Correlation with surgical history recommended. 3. Diverticulosis of the sigmoid colon.  No bowel obstruction. Electronically Signed   By: Burman Nieves M.D.   On: 06/10/2017 02:28    Labs:  CBC: Recent Labs    05/29/17 0416 06/09/17 1710 06/10/17 0131 06/10/17 0853  WBC 7.2 18.9* 15.0* 14.3*  HGB 7.7* 8.0* 7.4* 6.8*  HCT 23.3* 24.9* 22.4* 20.8*  PLT 455* 793* 628* 627*    COAGS: Recent Labs    05/06/17 0539  INR 1.04  APTT 29    BMP: Recent Labs    05/22/17 0745 05/23/17 0441 05/29/17 0416 06/09/17 1710 06/10/17 0853  NA 140 139 141 134*  --   K 3.7 4.9 4.0 3.8  --   CL 103 103 102 97*  --   CO2 --  GLUCOSE 104* 133* 96 132*  --   BUN 14 12 5* 29*  --   CALCIUM 8.8* 8.6* 8.8* 9.2  --   CREATININE 0.87 0.86 0.81 1.22* 0.94  GFRNONAA >60 >60 >60 48* >60  GFRAA >60 >60 >60 56* >60    LIVER FUNCTION TESTS: Recent Labs    05/06/17 0539 05/07/17 0452 05/21/17 1432 06/10/17 0131  BILITOT 2.3* 1.0 0.3 0.6  AST 12* ALT ALKPHOS 131* 125 161* 255*  PROT 5.9* 6.5 7.9 6.6  ALBUMIN 2.3* 2.3* 3.4* 2.1*    Assessment and Plan: Patient with history of pelvic abscess, status post IR drain placement on 05/06/2017 with removal on 05/20/2017.  Status post small bowel resection with anastomosis x2 and surgical JP drain placement on 05/22/2017 with JP drain removal on 05/29/2017.  Now with leukocytosis,  abdominal discomfort, draining right lower quadrant wound site and recurrent right lower quadrant pelvic abscess on CT. request received for CT-guided drainage of recurrent right lower quadrant abscess.  Imaging studies have been reviewed by Dr. Lowella Dandy. Risks and benefits discussed with the patient including bleeding, infection, damage to adjacent structures, bowel perforation/fistula connection, and sepsis.  All of the patient's questions were answered, patient is agreeable to proceed. Consent signed and in chart.  Procedure scheduled for later today; hgb 6.8 this am, WBC 14.3; lovenox has been held   Electronically Signed: D. Jeananne Rama, PA-C 06/10/2017, 11:09 AM   I spent a total of 25 minutes at the the patient's bedside AND on the patient's hospital floor or unit, greater than 50% of which was counseling/coordinating care for CT-guided drainage of pelvic abscess

## 2017-06-10 NOTE — ED Notes (Signed)
Patient transported to CT 

## 2017-06-10 NOTE — ED Provider Notes (Signed)
[facility]-EMERGENCY DEPT Provider Note   CSN: [id number] Arrival date & time: 06/09/17  1537     History   Chief Complaint Chief Complaint  Patient presents with  . Dizziness    HPI Kim Snow is a 57 y.o. female.  HPI  This is a 57 year old female with recent history of complicated diverticulitis requiring bowel resection and JP drainage of localized infection who presents with concerns for worsening abdominal pain and dizziness.  Patient followed up with her surgeon's office yesterday.  She had noted purulent drainage from the JP drain site.  She has not noted any fevers or chills.  In her surgeon's office she was noted to be hypotensive and tachycardic.  Patient does note over the last 2 to 3 days that she has felt more dizzy.  Worse with standing.  She states that she has had pain since discharge; however pain has not increased.  She denies any chest pain, nausea, vomiting.  She reports normal stools.  Past Medical History:  Diagnosis Date  . Anemia   . Anxiety   . Asthma   . Depression   . GERD (gastroesophageal reflux disease)   . Hypertension   . Insomnia   . Migraine     Patient Active Problem List   Diagnosis Date Noted  . Postprocedural intraabdominal abscess 06/10/2017  . Perforation of small intestines s/p SB resection x 2 05/22/2017 05/25/2017  . Abdominopelvic abscess s/p OR drainage 05/25/2017  . Pneumoperitoneum 05/23/2017  . Diverticulitis large intestine 05/21/2017    Past Surgical History:  Procedure Laterality Date  . CESAREAN SECTION    . CHOLECYSTECTOMY    . IR RADIOLOGIST EVAL & MGMT  05/20/2017  . KNEE ARTHROSCOPY    . LAPAROSCOPY N/A 05/22/2017   Procedure: LAPAROSCOPY DIAGNOSTIC, LYSIS OF ADHESIONS, DRAINAGE OF ABCESS, RIGID PROCTOSCOPY, SMALL BOWEL RESECTION X2;  Surgeon: Kinsinger, De Blanch, MD;  Location: [facility];  Service: General;  Laterality: N/A;     OB History   None      Home Medications     Prior to Admission medications   Medication Sig Start Date End Date Taking? Authorizing Provider  ALPRAZolam Prudy Feeler) 1 MG tablet Take 1 mg by mouth at bedtime as needed for sleep.  02/11/17  Yes [provider]  CALCIUM PO Take 1,000 mg by mouth daily.   Yes [provider]  Cyanocobalamin (VITAMIN B 12 PO) Take 1 tablet by mouth daily.   Yes [provider]  losartan-hydrochlorothiazide (HYZAAR) 100-25 MG tablet Take 1 tablet by mouth daily. 03/11/17  Yes [provider]  meloxicam (MOBIC) 15 MG tablet Take 15 mg by mouth daily at 12 noon. 04/21/17  Yes [provider]  omeprazole (PRILOSEC) 40 MG capsule Take 40 mg by mouth daily. 03/28/17  Yes [provider]  Polysaccharide Iron Complex (IFEREX 150 PO) Take 150 mg by mouth daily.   Yes [provider]  traMADol (ULTRAM) 50 MG tablet Take 1 tablet (50 mg total) by mouth every 6 (six) hours. Patient taking differently: Take 100 mg by mouth 2 (two) times daily.  05/29/17  Yes Focht, Joyce Copa, PA  acetaminophen (TYLENOL) 325 MG tablet Take 2 tablets (650 mg total) by mouth every 6 (six) hours as needed for mild pain (or temp > 100). Patient not taking: Reported on 06/10/2017 05/11/17   Berna Bue, MD  amoxicillin-clavulanate (AUGMENTIN) 875-125 MG tablet Take 1 tablet by mouth 2 (two) times daily.  06/09/17  [provider]  docusate sodium (COLACE) 100 MG capsule Take 1 capsule (100 mg total) by mouth 2 (two) times daily. Patient not taking: Reported on 06/10/2017 05/11/17   Berna Bue, MD  oxyCODONE (OXY IR/ROXICODONE) 5 MG immediate release tablet Take 1 tablet (5 mg total) by mouth every 6 (six) hours as needed (  for moderate pain,  for severe pain). Patient not taking: Reported on 06/10/2017 05/29/17   Jerre Simon, PA    Family History No family history on file.  Social History Social History   Tobacco Use  . Smoking status: Never Smoker  .  Smokeless tobacco: Never Used  Substance Use Topics  . Alcohol use: Yes    Comment: social  . Drug use: Never     Allergies   Patient has no known allergies.   Review of Systems Review of Systems  Constitutional: Negative for fever.  Respiratory: Negative for shortness of breath.   Cardiovascular: Negative for chest pain.  Gastrointestinal: Positive for abdominal pain. Negative for constipation, diarrhea, nausea and vomiting.  Genitourinary: Negative for dysuria.  Neurological: Positive for dizziness.  All other systems reviewed and are negative.    Physical Exam Updated Vital Signs BP (!) 98/54   Pulse (!) 109   Temp 99 F (37.2 C) (Oral)   Resp 17   Ht  (1.651 m)   Wt 69.9 kg (154 lb)   SpO2 100%   BMI 25.63 kg/m   Physical Exam  Constitutional: She is oriented to person, place, and time. She appears well-developed and well-nourished.  HENT:  Head: Normocephalic and atraumatic.  Cardiovascular: Regular rhythm and normal heart sounds.  Tachycardia  Pulmonary/Chest: Effort normal and breath sounds normal. No respiratory distress. She has no wheezes.  Abdominal: Soft.  Multiple abdominal incisions, lower abdominal incision well-healing without any discharge, right midabdomen JP drain site with purulent drainage, no significant surrounding erythema, multiple laparoscopic incisions well-healing, diffuse mild tenderness to palpation, no rebound or guarding  Neurological: She is alert and oriented to person, place, and time.  Skin: Skin is warm and dry.  Psychiatric: She has a normal mood and affect.  Nursing note and vitals reviewed.    ED Treatments / Results  Labs (all labs ordered are listed, but only abnormal results are displayed) Labs Reviewed  BASIC METABOLIC PANEL - Abnormal; Notable for the following components:      Result Value   Sodium 134 (*)    Chloride 97 (*)    Glucose, Bld 132 (*)    BUN 29 (*)    Creatinine, Ser 1.22 (*)    GFR calc  non Af Amer 48 (*)    GFR calc Af Amer 56 (*)    All other components within normal limits  CBC - Abnormal; Notable for the following components:   WBC 18.9 (*)    RBC 2.88 (*)    Hemoglobin 8.0 (*)    HCT 24.9 (*)    Platelets 793 (*)    All other components within normal limits  HEPATIC FUNCTION PANEL - Abnormal; Notable for the following components:   Albumin 2.1 (*)    Alkaline Phosphatase 255 (*)    All other components within normal limits  CBC WITH DIFFERENTIAL/PLATELET - Abnormal; Notable for the following components:   WBC 15.0 (*)    RBC 2.60 (*)    Hemoglobin 7.4 (*)    HCT 22.4 (*)    Platelets 628 (*)    All other components  within normal limits  I-STAT BETA HCG BLOOD, ED ([facility], [facility], [facility] ONLY) - Abnormal; Notable for the following components:   I-stat hCG, quantitative 5.2 (*)    All other components within normal limits  CULTURE, BLOOD (ROUTINE X 2)  CULTURE, BLOOD (ROUTINE X 2)  URINALYSIS, ROUTINE W REFLEX MICROSCOPIC  CBG MONITORING, ED  I-STAT CG4 LACTIC ACID, ED  I-STAT CG4 LACTIC ACID, ED    EKG EKG Interpretation  Date/Time:  Monday June 09 2017 16:45:01 EDT Ventricular Rate:  115 PR Interval:    QRS Duration: 139 QT Interval:  347 QTC Calculation: 480 R Axis:   63 Text Interpretation:  Sinus tachycardia Left bundle branch block No significant change since last tracing Confirmed by Ross Marcus ([id number]) on 06/10/2017 12:38:40 AM   Radiology Ct Abdomen Pelvis W Contrast  Result Date: 06/10/2017 CLINICAL DATA:  Dizziness and hypotension since removal of drain 3 days ago. White cell count 18.9. EXAM: CT ABDOMEN AND PELVIS WITH CONTRAST TECHNIQUE: Multidetector CT imaging of the abdomen and pelvis was performed using the standard protocol following bolus administration of intravenous contrast. CONTRAST:  80mL ISOVUE-300 IOPAMIDOL (ISOVUE-300) INJECTION 61% COMPARISON:  05/20/2017 FINDINGS: Lower chest: Lung bases are clear. Hepatobiliary: No focal liver  abnormality is seen. Status post cholecystectomy. No biliary dilatation. Pancreas: Unremarkable. No pancreatic ductal dilatation or surrounding inflammatory changes. Spleen: Normal in size without focal abnormality. Adrenals/Urinary Tract: Adrenal glands are unremarkable. Kidneys are normal, without renal calculi, focal lesion, or hydronephrosis. Bladder is unremarkable. Stomach/Bowel: Stomach, small bowel, and colon are not abnormally distended. Surgical anastomoses demonstrated in the right: And right lower quadrant bowel. Diverticulosis of the colon. Since the previous study a trans sciatic drainage catheter has been removed. There appears to be recurrent loculated fluid, likely abscess arising in the previous catheter site and along the low pelvis. Fluid collection measures about 3.7 x 11.7 cm. There is a new sinus tract versus ileostomy in the right lower quadrant. This is decompressed. Subcutaneous emphysema adjacent to the ileostomy. Vascular/Lymphatic: No significant vascular findings are present. No enlarged abdominal or pelvic lymph nodes. Reproductive: Uterus and bilateral adnexa are unremarkable. Other: No abdominal wall hernia or abnormality. No abdominopelvic ascites. Musculoskeletal: No acute or significant osseous findings. IMPRESSION: 1. Recurrent abscess in the right low pelvis post catheter removal. 2. New since the previous study, there is a sinus tract versus ileostomy extending to the right lower quadrant with adjacent subcutaneous gas. Correlation with surgical history recommended. 3. Diverticulosis of the sigmoid colon.  No bowel obstruction. Electronically Signed   By: Burman Nieves M.D.   On: 06/10/2017 02:28    Procedures Procedures (including critical care time)  CRITICAL CARE Performed by: Shon Baton   Total critical care time: 30 minutes  Critical care time was exclusive of separately billable procedures and treating other patients.  Critical care was necessary  to treat or prevent imminent or life-threatening deterioration.  Critical care was time spent personally by me on the following activities: development of treatment plan with patient and/or surrogate as well as nursing, discussions with consultants, evaluation of patient's response to treatment, examination of patient, obtaining history from patient or surrogate, ordering and performing treatments and interventions, ordering and review of laboratory studies, ordering and review of radiographic studies, pulse oximetry and re-evaluation of patient's condition.  Medications Ordered in ED Medications  iopamidol (ISOVUE-300) 61 % injection (has no administration in time range)  0.9 %  sodium chloride infusion (has no administration in time range)  ondansetron (ZOFRAN) injection 4 mg (has no administration in time range)  morphine 4 MG/ML injection 4 mg (has no administration in time range)  sodium chloride 0.9 % bolus 1,000 mL (0 mLs Intravenous Stopped 06/10/17 0130)  sodium chloride 0.9 % bolus 1,250 mL (1,250 mLs Intravenous New Bag/Given 06/10/17 0200)  piperacillin-tazobactam (ZOSYN) IVPB 3.375 g (0 g Intravenous Stopped 06/10/17 0210)  iopamidol (ISOVUE-300) 61 % injection 100 mL (80 mLs Intravenous Contrast Given 06/10/17 0148)  morphine 4 MG/ML injection 4 mg (4 mg Intravenous Given 06/10/17 0253)  ondansetron (ZOFRAN) injection 4 mg (4 mg Intravenous Given 06/10/17 0254)     Initial Impression / Assessment and Plan / ED Course  I have reviewed the triage vital signs and the nursing notes.  Pertinent labs & imaging results that were available during my care of the patient were reviewed by me and considered in my medical decision making (see chart for details).  Clinical Course as of Jun 10 305  Tue Jun 10, 2017  0302 Discussed with Dr. Gerrit Friends.  Agrees with treatment plan.  Recommends n.p.o. status and fluids.  Requesting that I place admission orders under Dr. Sheliah Hatch.   [CH]    Clinical  Course User Index [CH] Wilena Tyndall, Mayer Masker, MD    Patient presents with concerns for drainage from her JP drain site.  She was noted to be mildly hypotensive and tachycardic.  She does take blood pressure medications but her normal blood pressure is 120/80.  She is overall nontoxic-appearing.  No fevers here.  However, given vital signs, sepsis work-up was initiated.  Lactate is normal.  Patient was given 30 cc/kg fluid and Zosyn to cover for intra-abdominal infection.  She does have a leukocytosis to 18.  Also labs evidence of mild AKI with a creatinine of 1.22.  Patient was fluid responsive.  Repeat blood pressure on my recheck is 108 systolic.  CT scan obtained and concerning for reaccumulation of abscess and possible sinus tract.  This was discussed with general surgery.  Plan for admission.  Sepsis - Repeat Assessment  Performed at:    0309  Vitals     Blood pressure (!) 98/54 (not update), pulse (!) 109 (not update), temperature 99 F (37.2 C), temperature source Oral, resp. rate 17, height  (1.651 m), weight 69.9 kg (154 lb), SpO2 100 %. MOST RECENT BP 108/70, HR 103 Heart:     Tachycardic  Lungs:    Clear  Capillary Refill:   <2 sec  Peripheral Pulse:   Radial pulse palpable  Skin:     Normal Color  Final Clinical Impressions(s) / ED Diagnoses   Final diagnoses:  Colonic diverticular abscess  Severe sepsis [facility])    ED Discharge Orders    None       Shon Baton, MD 06/10/17 [phone]

## 2017-06-10 NOTE — H&P (Signed)
[facility] Surgery Admission Note  Kim Snow 07-18-1960  [id number].    Requesting MD: Thayer Jew Chief Complaint/Reason for Consult: pelvic abscess  HPI:  Kim Snow is a 57yo female nearly 3 weeks out from laparoscopic lysis of adhesions, small bowel resection with anastomosis x2, 30cm^2 placement of negative pressure wound dressing, rigid proctoscopy 05/22/17 by Dr. Kieth Brightly for Pelvic abscess with small intestine perforation. She was initially admitted to the hospital on 05/05/17 for perforated diverticulitis with abscess; she underwent percutaneous drain by IR on 05/06/17 and was discharged home on 05/11/17. Patient followed up with IR and drain was removed on 4/18. States that she was doing well until this weekend. She reports noticing foul-smelling, purulent drainage from previous drain site as well as subjective fevers and intermittent abdominal pain. Denies n/v. She has been tolerating a diet and having bowel function. Kim Snow was seen in our clinic yesterday and found to be hypotensive and tachycardic. She was advised to go to the ED.  In the ED she had a CT scan that showed recurrent abscess in the right low pelvis post catheter removal (3.7 x 11.7 cm); there is a sinus tract extending to the right lower quadrant with adjacent subcutaneous gas. WBC 18.9, afebrile, intermittent tachycardia and hypotension. General surgery asked to see.  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  HENT: Negative.   Eyes: Negative.   Respiratory: Negative.   Cardiovascular: Negative.   Gastrointestinal: Positive for abdominal pain. Negative for constipation, diarrhea, nausea and vomiting.       Drainage from abdominal wound  Genitourinary: Negative.   Musculoskeletal: Negative.   Skin:  Negative.   Neurological: Negative.    All systems reviewed and otherwise negative except for as above  No family history on file.  Past Medical History:  Diagnosis Date  . Anemia   . Anxiety   . Asthma   . Depression   . GERD (gastroesophageal reflux disease)   . Hypertension   . Insomnia   . Migraine     Past Surgical History:  Procedure Laterality Date  . CESAREAN SECTION    . CHOLECYSTECTOMY    . IR RADIOLOGIST EVAL & MGMT  05/20/2017  . KNEE ARTHROSCOPY    . LAPAROSCOPY N/A 05/22/2017   Procedure: LAPAROSCOPY DIAGNOSTIC, LYSIS OF ADHESIONS, DRAINAGE OF ABCESS, RIGID PROCTOSCOPY, SMALL BOWEL RESECTION X2;  Surgeon: Kinsinger, Arta Bruce, MD;  Location: [facility] ORS;  Service: General;  Laterality: N/A;    Social History:  reports that she has never smoked. She has never used smokeless tobacco. She reports that she drinks alcohol. She reports that she does not use drugs.  Allergies: No Known Allergies  Medications Prior to Admission  Medication Sig Dispense Refill  . ALPRAZolam (XANAX) 1 MG tablet Take 1 mg by mouth at bedtime as needed for sleep.     Marland Kitchen CALCIUM PO Take 1,000 mg by mouth daily.    . Cyanocobalamin (VITAMIN B 12 PO) Take 1 tablet by mouth daily.    Marland Kitchen losartan-hydrochlorothiazide (HYZAAR) 100-25 MG tablet Take 1 tablet by mouth daily.    . meloxicam (MOBIC) 15 MG tablet Take 15 mg by mouth daily at 12 noon.    Marland Kitchen omeprazole (PRILOSEC) 40 MG capsule Take 40 mg by mouth daily.    . Polysaccharide Iron Complex (IFEREX 150 PO) Take 150  mg by mouth daily.    . traMADol (ULTRAM) 50 MG tablet Take 1 tablet (50 mg total) by mouth every 6 (six) hours. (Patient taking differently: Take 100 mg by mouth 2 (two) times daily. ) 30 tablet 0  . acetaminophen (TYLENOL) 325 MG tablet Take 2 tablets (650 mg total) by mouth every 6 (six) hours as needed for mild pain (or temp > 100). (Patient not taking: Reported on 06/10/2017)    . amoxicillin-clavulanate (AUGMENTIN) 875-125 MG tablet  Take 1 tablet by mouth 2 (two) times daily.     Marland Kitchen docusate sodium (COLACE) 100 MG capsule Take 1 capsule (100 mg total) by mouth 2 (two) times daily. (Patient not taking: Reported on 06/10/2017) 30 capsule 0  . oxyCODONE (OXY IR/ROXICODONE) 5 MG immediate release tablet Take 1 tablet (5 mg total) by mouth every 6 (six) hours as needed (894m for moderate pain, 154mfor severe pain). (Patient not taking: Reported on 06/10/2017) 15 tablet 0    Prior to Admission medications   Medication Sig Start Date End Date Taking? Authorizing Provider  ALPRAZolam (XDuanne Moron1 MG tablet Take 1 mg by mouth at bedtime as needed for sleep.  02/11/17  Yes [provider]  CALCIUM PO Take 1,000 mg by mouth daily.   Yes [provider]  Cyanocobalamin (VITAMIN B 12 PO) Take 1 tablet by mouth daily.   Yes [provider]  losartan-hydrochlorothiazide (HYZAAR) 100-25 MG tablet Take 1 tablet by mouth daily. 03/11/17  Yes [provider]  meloxicam (MOBIC) 15 MG tablet Take 15 mg by mouth daily at 12 noon. 04/21/17  Yes [provider]  omeprazole (PRILOSEC) 40 MG capsule Take 40 mg by mouth daily. 03/28/17  Yes [provider]  Polysaccharide Iron Complex (IFEREX 150 PO) Take 150 mg by mouth daily.   Yes [provider]  traMADol (ULTRAM) 50 MG tablet Take 1 tablet (50 mg total) by mouth every 6 (six) hours. Patient taking differently: Take 100 mg by mouth 2 (two) times daily.  05/29/17  Yes Focht, JeFraser DinPA  acetaminophen (TYLENOL) 325 MG tablet Take 2 tablets (650 mg total) by mouth every 6 (six) hours as needed for mild pain (or temp > 100). Patient not taking: Reported on 06/10/2017 05/11/17   CoClovis RileyMD  amoxicillin-clavulanate (AUGMENTIN) 875-125 MG tablet Take 1 tablet by mouth 2 (two) times daily.  06/09/17   [provider]  docusate sodium (COLACE) 100 MG capsule Take 1 capsule (100 mg total) by mouth 2 (two) times daily. Patient not taking:  Reported on 06/10/2017 05/11/17   CoClovis RileyMD  oxyCODONE (OXY IR/ROXICODONE) 5 MG immediate release tablet Take 1 tablet (5 mg total) by mouth every 6 (six) hours as needed (94m58mor moderate pain, 23m66mr severe pain). Patient not taking: Reported on 06/10/2017 05/29/17   FochKalman Drape    Blood pressure 114/71, pulse 98, temperature 98.7 F (37.1 C), temperature source Oral, resp. rate 18, height '5\' 5"'  (1.651 m), weight 159 lb 9.8 oz (72.4 kg), SpO2 100 %. Physical Exam: General: pleasant, WD/WN white female who is laying in bed in NAD HEENT: head is normocephalic, atraumatic.  Sclera are noninjected.  Pupils equal and round.  Ears and nose without any masses or lesions.  Mouth is pink and moist. Dentition fair Heart: regular, rate, and rhythm.  No obvious murmurs, gallops, or rubs noted.  Palpable pedal pulses bilaterally Lungs: CTAB, no wheezes, rhonchi, or rales noted.  Respiratory effort nonlabored Abd: healing lower transverse abdominal incision with good granulation tissue and is nearly closed, soft, ND, +BS, previous drain site in RLQ with trace purulent drainage/no surrounding erythema/ mild TTP around this area without rebound or guarding MS: all 4 extremities are symmetrical with no cyanosis, clubbing, or edema. Skin: warm and dry with no masses, lesions, or rashes Psych: A&Ox3 with an appropriate affect. Neuro: cranial nerves grossly intact, extremity CSM intact bilaterally, normal speech  Results for orders placed or performed during the hospital encounter of 06/10/17 (from the past 48 hour(s))  Basic metabolic panel     Status: Abnormal   Collection Time: 06/09/17  5:10 PM  Result Value Ref Range   Sodium 134 (L) 135 - 145 mmol/L   Potassium 3.8 3.5 - 5.1 mmol/L   Chloride 97 (L) 101 - 111 mmol/L   CO2 24 22 - 32 mmol/L   Glucose, Bld 132 (H) 65 - 99 mg/dL   BUN 29 (H) 6 - 20 mg/dL   Creatinine, Ser 1.22 (H) 0.44 - 1.00 mg/dL   Calcium 9.2 8.9 - 10.3 mg/dL    GFR calc non Af Amer 48 (L) >60 mL/min   GFR calc Af Amer 56 (L) >60 mL/min    Comment: (NOTE) The eGFR has been calculated using the CKD EPI equation. This calculation has not been validated in all clinical situations. eGFR's persistently <60 mL/min signify possible Chronic Kidney Disease.    Anion gap 13 5 - 15    Comment: Performed at [facility], [place] [address], [place], [place] [address]  CBC     Status: Abnormal   Collection Time: 06/09/17  5:10 PM  Result Value Ref Range   WBC 18.9 (H) 4.0 - 10.5 K/uL   RBC 2.88 (L) 3.87 - 5.11 MIL/uL   Hemoglobin 8.0 (L) 12.0 - 15.0 g/dL   HCT 24.9 (L) 36.0 - 46.0 %   MCV 86.5 78.0 - 100.0 fL   MCH 27.8 26.0 - 34.0 pg   MCHC 32.1 30.0 - 36.0 g/dL   RDW 14.9 11.5 - 15.5 %   Platelets 793 (H) 150 - 400 K/uL    Comment: Performed at [facility], [place] [address] [place], [place] [address]  I-Stat beta hCG blood, ED     Status: Abnormal   Collection Time: 06/09/17  5:15 PM  Result Value Ref Range   I-stat hCG, quantitative 5.2 (H) <5 mIU/mL   Comment 3            Comment:   GEST. AGE      CONC.  (mIU/mL)   <=1 WEEK        5 - 50     2 WEEKS       50 - 500     3 WEEKS       100 - 10,000     4 WEEKS     1,000 - 30,000        FEMALE AND NON-PREGNANT FEMALE:     LESS THAN 5 mIU/mL   Hepatic function panel     Status: Abnormal   Collection Time: 06/10/17  1:31 AM  Result Value Ref Range   Total Protein 6.6 6.5 - 8.1 g/dL   Albumin 2.1 (L) 3.5 - 5.0 g/dL   AST 31 15 - 41 U/L   ALT 21 14 - 54 U/L   Alkaline Phosphatase 255 (H) 38 - 126 U/L   Total Bilirubin 0.6 0.3 -  1.2 mg/dL   Bilirubin, Direct 0.2 0.1 - 0.5 mg/dL   Indirect Bilirubin 0.4 0.3 - 0.9 mg/dL    Comment: Performed at [facility], [place] [address], [place], [place] [address]  CBC WITH DIFFERENTIAL     Status: Abnormal   Collection Time: 06/10/17  1:31 AM  Result Value Ref Range   WBC 15.0 (H) 4.0 - 10.5 K/uL   RBC  2.60 (L) 3.87 - 5.11 MIL/uL   Hemoglobin 7.4 (L) 12.0 - 15.0 g/dL   HCT 22.4 (L) 36.0 - 46.0 %   MCV 86.2 78.0 - 100.0 fL   MCH 28.5 26.0 - 34.0 pg   MCHC 33.0 30.0 - 36.0 g/dL   RDW 15.2 11.5 - 15.5 %   Platelets 628 (H) 150 - 400 K/uL   Neutrophils Relative % 71 %   Lymphocytes Relative 18 %   Monocytes Relative 10 %   Eosinophils Relative 1 %   Basophils Relative 0 %   Neutro Abs 10.6 (H) 1.7 - 7.7 K/uL   Lymphs Abs 2.7 0.7 - 4.0 K/uL   Monocytes Absolute 1.5 (H) 0.1 - 1.0 K/uL   Eosinophils Absolute 0.2 0.0 - 0.7 K/uL   Basophils Absolute 0.0 0.0 - 0.1 K/uL   RBC Morphology POLYCHROMASIA PRESENT     Comment: Performed at [facility], [place] [address], [place], [place] [address]  I-Stat CG4 Lactic Acid, ED  (not at  [facility])     Status: None   Collection Time: 06/10/17  1:44 AM  Result Value Ref Range   Lactic Acid, Venous 0.84 0.5 - 1.9 mmol/L  Urinalysis, Routine w reflex microscopic     Status: Abnormal   Collection Time: 06/10/17  2:22 AM  Result Value Ref Range   Color, Urine STRAW (A) YELLOW   APPearance CLEAR CLEAR   Specific Gravity, Urine 1.011 1.005 - 1.030   pH 5.0 5.0 - 8.0   Glucose, UA NEGATIVE NEGATIVE mg/dL   Hgb urine dipstick SMALL (A) NEGATIVE   Bilirubin Urine NEGATIVE NEGATIVE   Ketones, ur NEGATIVE NEGATIVE mg/dL   Protein, ur NEGATIVE NEGATIVE mg/dL   Nitrite NEGATIVE NEGATIVE   Leukocytes, UA NEGATIVE NEGATIVE   RBC / HPF 0-5 0 - 5 RBC/hpf   WBC, UA 0-5 0 - 5 WBC/hpf   Bacteria, UA NONE SEEN NONE SEEN   Squamous Epithelial / LPF 0-5 0 - 5    Comment: Please note change in reference range.   Mucus PRESENT     Comment: Performed at [facility], [place] [address] [place], [place] [address]  CBC     Status: Abnormal   Collection Time: 06/10/17  8:53 AM  Result Value Ref Range   WBC 14.3 (H) 4.0 - 10.5 K/uL   RBC 2.39 (L) 3.87 - 5.11 MIL/uL   Hemoglobin 6.8 (LL) 12.0 - 15.0 g/dL    Comment: CRITICAL RESULT  CALLED TO, READ BACK BY AND VERIFIED WITH: TUCKER,M. RN '@[id number]'  ON 4.30.19 BY NMCCOY    HCT 20.8 (L) 36.0 - 46.0 %   MCV 87.0 78.0 - 100.0 fL   MCH 28.5 26.0 - 34.0 pg   MCHC 32.7 30.0 - 36.0 g/dL   RDW 15.1 11.5 - 15.5 %   Platelets 627 (H) 150 - 400 K/uL    Comment: Performed at [facility], [place] [address] [place], [place] [address]  Creatinine, serum     Status: None   Collection Time: 06/10/17  8:53 AM  Result Value Ref Range   Creatinine, Ser 0.94 0.44 - 1.00 mg/dL   GFR calc non Af Amer >60 >60 mL/min   GFR calc Af Amer >60 >60 mL/min    Comment: (NOTE) The eGFR has been calculated using the CKD EPI equation. This calculation has not been validated in all clinical situations. eGFR's persistently <60 mL/min signify possible Chronic Kidney Disease. Performed at [facility], [place] [address], [place], [place] [address]   Protime-INR     Status: None   Collection Time: 06/10/17 11:42 AM  Result Value Ref Range   Prothrombin Time 13.5 11.4 - 15.2 seconds   INR 1.04     Comment: Performed at [facility], [place] [address] [place], [place] [address]   Ct Abdomen Pelvis W Contrast  Result Date: 06/10/2017 CLINICAL DATA:  Dizziness and hypotension since removal of drain 3 days ago. White cell count 18.9. EXAM: CT ABDOMEN AND PELVIS WITH CONTRAST TECHNIQUE: Multidetector CT imaging of the abdomen and pelvis was performed using the standard protocol following bolus administration of intravenous contrast. CONTRAST:  25m ISOVUE-300 IOPAMIDOL (ISOVUE-300) INJECTION 61% COMPARISON:  05/20/2017 FINDINGS: Lower chest: Lung bases are clear. Hepatobiliary: No focal liver abnormality is seen. Status post cholecystectomy. No biliary dilatation. Pancreas: Unremarkable. No pancreatic ductal dilatation or surrounding inflammatory changes. Spleen: Normal in size without focal abnormality. Adrenals/Urinary Tract: Adrenal glands are unremarkable.  Kidneys are normal, without renal calculi, focal lesion, or hydronephrosis. Bladder is unremarkable. Stomach/Bowel: Stomach, small bowel, and colon are not abnormally distended. Surgical anastomoses demonstrated in the right: And right lower quadrant bowel. Diverticulosis of the colon. Since the previous study a trans sciatic drainage catheter has been removed. There appears to be recurrent loculated fluid, likely abscess arising in the previous catheter site and along the low pelvis. Fluid collection measures about 3.7 x 11.7 cm. There is a new sinus tract versus ileostomy in the right lower quadrant. This is decompressed. Subcutaneous emphysema adjacent to the ileostomy. Vascular/Lymphatic: No significant vascular findings are present. No enlarged abdominal or pelvic lymph nodes. Reproductive: Uterus and bilateral adnexa are unremarkable. Other: No abdominal wall hernia or abnormality. No abdominopelvic ascites. Musculoskeletal: No acute or significant osseous findings. IMPRESSION: 1. Recurrent abscess in the right low pelvis post catheter removal. 2. New since the previous study, there is a sinus tract versus ileostomy extending to the right lower quadrant with adjacent subcutaneous gas. Correlation with surgical history recommended. 3. Diverticulosis of the sigmoid colon.  No bowel obstruction. Electronically Signed   By: WLucienne CapersM.D.   On: 06/10/2017 02:28      Assessment/Plan HTN - hold home meds due to hypotension GERD - protonix Anxiety - xanax PRN at bedtime Symptomatic anemia - Hg 6.8, will transfuse 1 unit PRBCs AKI - Cr 1.22 on admission, improving with IVF 0.94  Pelvic abscess with small intestine perforation s/p laparoscopic lysis of adhesions, small bowel resection with anastomosis x2, 30cm^2 placement of negative pressure wound dressing, rigid proctoscopy 4/11 Dr. KKieth Brightly- discharged 4/18, seen in clinic yesterday with purulent drainage coming from prior drain site,  tachycardia, and hypotension and she was sent to the ED - CT today shows Recurrent abscess in the right low pelvis post catheter removal (3.7 x 11.7 cm); there is a sinus tract extending to the right lower quadrant with adjacent subcutaneous gas - WBC 18.9, afebrile, intermittent tachycardia and hypotension  ID - zosyn 4/30>> FEN - IVF, NPO for procedure VTE -  SCDs Foley - none  Plan - Patient admitted to the general surgery service. IR has been consulted for percutaneous drainage of pelvic abscess. Keep NPO for procedure. Continue IVF and IV zosyn. She also has symptomatic anemia so I will order 1 unit PRBCs. Labs in [place], [facility] Surgery 06/10/2017, 12:20 PM Pager: [phone] Consults: [phone] Mon-Fri 7:00 am-4:30 pm Sat-Sun 7:00 am-11:30 am

## 2017-06-10 NOTE — Progress Notes (Signed)
A consult was received from an ED physician for zosyn per pharmacy dosing.  The patient's profile has been reviewed for ht/wt/allergies/indication/available labs.   A one time order has been placed for Zosyn 3.375 Gm.  Further antibiotics/pharmacy consults should be ordered by admitting physician if indicated.                       Thank you, Kim Snow 06/10/2017  1:12 AM

## 2017-06-10 NOTE — Progress Notes (Signed)
PHARMACY NOTE:   ED ANTIBIOTICS  Currently awaiting admission orders.   Next Zosyn dose will be due at 0730 so have placed one-time order for same to prevent interruption in antibiotic therapy.  Elie Goody, PharmD, BCPS [phone] 06/10/2017  5:53 AM

## 2017-06-10 NOTE — Procedures (Signed)
CT guided placement of drain within lower abdominal abscess.  Removed approximately 15 ml of purulent yellow fluid and some gas.  10 Fr drain placed.  Not clear if this drain will decompress the entire collection and will need close imaging follow-up.  Minimal blood loss and no immediate complication.

## 2017-06-11 LAB — COMPREHENSIVE METABOLIC PANEL
ALK PHOS: 364 U/L — AB (ref 38–126)
ALT: 49 U/L (ref 14–54)
AST: 90 U/L — AB (ref 15–41)
Albumin: 1.8 g/dL — ABNORMAL LOW (ref 3.5–5.0)
Anion gap: 9 (ref 5–15)
BUN: 9 mg/dL (ref 6–20)
CALCIUM: 8.4 mg/dL — AB (ref 8.9–10.3)
CHLORIDE: 104 mmol/L (ref 101–111)
CO2: 25 mmol/L (ref 22–32)
Creatinine, Ser: 0.64 mg/dL (ref 0.44–1.00)
Glucose, Bld: 107 mg/dL — ABNORMAL HIGH (ref 65–99)
Potassium: 3.8 mmol/L (ref 3.5–5.1)
Sodium: 138 mmol/L (ref 135–145)
Total Bilirubin: 0.6 mg/dL (ref 0.3–1.2)
Total Protein: 6 g/dL — ABNORMAL LOW (ref 6.5–8.1)

## 2017-06-11 LAB — BPAM RBC
BLOOD PRODUCT EXPIRATION DATE: 201905272359
ISSUE DATE / TIME: 201904301410
Unit Type and Rh: 5100

## 2017-06-11 LAB — CBC WITH DIFFERENTIAL/PLATELET
BASOS PCT: 0 %
Basophils Absolute: 0 10*3/uL (ref 0.0–0.1)
EOS ABS: 0.3 10*3/uL (ref 0.0–0.7)
EOS PCT: 3 %
HCT: 24.8 % — ABNORMAL LOW (ref 36.0–46.0)
Hemoglobin: 8 g/dL — ABNORMAL LOW (ref 12.0–15.0)
LYMPHS ABS: 2.1 10*3/uL (ref 0.7–4.0)
Lymphocytes Relative: 21 %
MCH: 28.2 pg (ref 26.0–34.0)
MCHC: 32.3 g/dL (ref 30.0–36.0)
MCV: 87.3 fL (ref 78.0–100.0)
MONOS PCT: 9 %
Monocytes Absolute: 0.9 10*3/uL (ref 0.1–1.0)
Neutro Abs: 6.7 10*3/uL (ref 1.7–7.7)
Neutrophils Relative %: 67 %
Platelets: 569 10*3/uL — ABNORMAL HIGH (ref 150–400)
RBC: 2.84 MIL/uL — ABNORMAL LOW (ref 3.87–5.11)
RDW: 15 % (ref 11.5–15.5)
WBC: 10 10*3/uL (ref 4.0–10.5)

## 2017-06-11 LAB — TYPE AND SCREEN
ABO/RH(D): O POS
Antibody Screen: NEGATIVE
UNIT DIVISION: 0

## 2017-06-11 LAB — PHOSPHORUS: PHOSPHORUS: 2.7 mg/dL (ref 2.5–4.6)

## 2017-06-11 LAB — MAGNESIUM: MAGNESIUM: 1.3 mg/dL — AB (ref 1.7–2.4)

## 2017-06-11 MED ORDER — ELETRIPTAN HYDROBROMIDE 20 MG PO TABS
20.0000 mg | ORAL_TABLET | Freq: Once | ORAL | Status: AC
  Administered 2017-06-11: 20 mg via ORAL
  Filled 2017-06-11: qty 1

## 2017-06-11 MED ORDER — MAGNESIUM SULFATE 2 GM/50ML IV SOLN
2.0000 g | Freq: Once | INTRAVENOUS | Status: AC
  Administered 2017-06-11: 2 g via INTRAVENOUS
  Filled 2017-06-11: qty 50

## 2017-06-11 MED ORDER — DOCUSATE SODIUM 100 MG PO CAPS
100.0000 mg | ORAL_CAPSULE | Freq: Two times a day (BID) | ORAL | Status: DC
  Administered 2017-06-11 (×2): 100 mg via ORAL
  Filled 2017-06-11 (×3): qty 1

## 2017-06-11 NOTE — Progress Notes (Signed)
[facility] Progress Note     Subjective: CC-  S/p perc drain in IR for pelvic abscess yesterday. Initially only 15mL purulent yellow fluid/gas came out, but she had about 90cc more over night that is purulent/tan-colored. States that her abdomen is sore but not painful. Denies n/v. Very hungry. WBC trending down, VSS.  Objective: Vital signs in last 24 hours: Temp:  [97.9 F (36.6 C)-99.4 F (37.4 C)] 97.9 F (36.6 C) (05/01 0646) Pulse Rate:  [84-106] 93 (05/01 0646) Resp:  [12-26] 18 (05/01 0646) BP: (104-132)/(54-72) 119/72 (05/01 0646) SpO2:  [97 %-100 %] 99 % (05/01 0646) Weight:  [159 lb 9.8 oz (72.4 kg)] 159 lb 9.8 oz (72.4 kg) (04/30 0800) Last BM Date: 06/06/17  Intake/Output from previous day: 04/30 0701 - 05/01 0700 In: 2761.8 [P.O.:240; I.V.:1990.8; Blood:376; IV Piggyback:150] Out: 105 [Drains:105] Intake/Output this shift: No intake/output data recorded.  PE: Gen:  Alert, NAD, pleasant HEENT: EOM's intact, pupils equal and round Card:  RRR, no M/G/R heard Pulm:  CTAB, no W/R/R, effort normal Abd: healing lower transverse incision cdi, soft, ND, +BS, previous drain site in RLQ with trace drainage on palpation (decreased amount from yesterday)/no surrounding erythema, drain with tan fluid in bulb Ext:  Calves soft and nontender  Lab Results:  Recent Labs    06/10/17 0853 06/11/17 0701  WBC 14.3* 10.0  HGB 6.8* 8.0*  HCT 20.8* 24.8*  PLT 627* 569*   BMET Recent Labs    06/09/17 1710 06/10/17 0853  NA 134*  --   K 3.8  --   CL 97*  --   CO2 24  --   GLUCOSE 132*  --   BUN 29*  --   CREATININE 1.22* 0.94  CALCIUM 9.2  --    PT/INR Recent Labs    06/10/17 1142  LABPROT 13.5  INR 1.04   CMP     Component Value Date/Time   NA 134 (L) 06/09/2017 1710   K 3.8 06/09/2017 1710   CL 97 (L) 06/09/2017 1710   CO2 24 06/09/2017 1710   GLUCOSE 132 (H) 06/09/2017 1710   BUN 29 (H) 06/09/2017 1710   CREATININE 0.94 06/10/2017  0853   CALCIUM 9.2 06/09/2017 1710   PROT 6.6 06/10/2017 0131   ALBUMIN 2.1 (L) 06/10/2017 0131   AST 31 06/10/2017 0131   ALT 21 06/10/2017 0131   ALKPHOS 255 (H) 06/10/2017 0131   BILITOT 0.6 06/10/2017 0131   GFRNONAA >60 06/10/2017 0853   GFRAA >60 06/10/2017 0853   Lipase     Component Value Date/Time   LIPASE 28 05/21/2017 1432       Studies/Results: Ct Abdomen Pelvis W Contrast  Result Date: 06/10/2017 CLINICAL DATA:  Dizziness and hypotension since removal of drain 3 days ago. White cell count 18.9. EXAM: CT ABDOMEN AND PELVIS WITH CONTRAST TECHNIQUE: Multidetector CT imaging of the abdomen and pelvis was performed using the standard protocol following bolus administration of intravenous contrast. CONTRAST:  80mL ISOVUE-300 IOPAMIDOL (ISOVUE-300) INJECTION 61% COMPARISON:  05/20/2017 FINDINGS: Lower chest: Lung bases are clear. Hepatobiliary: No focal liver abnormality is seen. Status post cholecystectomy. No biliary dilatation. Pancreas: Unremarkable. No pancreatic ductal dilatation or surrounding inflammatory changes. Spleen: Normal in size without focal abnormality. Adrenals/Urinary Tract: Adrenal glands are unremarkable. Kidneys are normal, without renal calculi, focal lesion, or hydronephrosis. Bladder is unremarkable. Stomach/Bowel: Stomach, small bowel, and colon are not abnormally distended. Surgical anastomoses demonstrated in the right: And right lower quadrant bowel. Diverticulosis  of the colon. Since the previous study a trans sciatic drainage catheter has been removed. There appears to be recurrent loculated fluid, likely abscess arising in the previous catheter site and along the low pelvis. Fluid collection measures about 3.7 x 11.7 cm. There is a new sinus tract versus ileostomy in the right lower quadrant. This is decompressed. Subcutaneous emphysema adjacent to the ileostomy. Vascular/Lymphatic: No significant vascular findings are present. No enlarged abdominal or  pelvic lymph nodes. Reproductive: Uterus and bilateral adnexa are unremarkable. Other: No abdominal wall hernia or abnormality. No abdominopelvic ascites. Musculoskeletal: No acute or significant osseous findings. IMPRESSION: 1. Recurrent abscess in the right low pelvis post catheter removal. 2. New since the previous study, there is a sinus tract versus ileostomy extending to the right lower quadrant with adjacent subcutaneous gas. Correlation with surgical history recommended. 3. Diverticulosis of the sigmoid colon.  No bowel obstruction. Electronically Signed   By: Burman Nieves M.D.   On: 06/10/2017 02:28   Ct Image Guided Fluid Drain By Catheter  Result Date: 06/10/2017 INDICATION: 57 year old with history a partial small-bowel resection and lysis of adhesions related to a small bowel perforation. Patient has developed purulent output from an old surgical drain site. Recent CT imaging demonstrates a complex abscess in the lower abdomen and pelvis. EXAM: CT-GUIDED DRAIN PLACEMENT WITHIN THE ABDOMINAL/PELVIC ABSCESS COLLECTION MEDICATIONS: The patient is currently admitted to the hospital and receiving intravenous antibiotics. The antibiotics were administered within an appropriate time frame prior to the initiation of the procedure. ANESTHESIA/SEDATION: Fentanyl 100 mcg IV; Versed 4.0 mg IV Moderate Sedation Time:  30 minutes The patient was continuously monitored during the procedure by the interventional radiology nurse under my direct supervision. COMPLICATIONS: None immediate. PROCEDURE: Informed written consent was obtained from the patient after a thorough discussion of the procedural risks, benefits and alternatives. All questions were addressed. A timeout was performed prior to the initiation of the procedure. Patient was placed supine on the CT scanner. Images through the lower abdomen and pelvis were obtained. Gas containing collection in the lower abdomen and pelvis was identified. The lower  abdomen was prepped with chlorhexidine and sterile field was created. Skin and soft tissues were anesthetized with 1% lidocaine. An 18 gauge coaxial needle was directed into this complex air-fluid collection with CT guidance. Gas and purulent yellow fluid was aspirated. Stiff Amplatz wire was advanced into the collection. Tract was dilated to accommodate a 10 [place] drain. Approximately 15 mL of yellow purulent fluid was aspirated along with gas. Drain was secured to the skin with a suture and adhesive device. Drain was attached to a suction bulb. Fluid sample was sent for culture. FINDINGS: Complex air-fluid collection in the lower abdomen and pelvis adjacent to the uterus and left adnexa. The wire and drain coiled into a small cavity within the central abdomen. Wire and catheter did not cross the midline into the more posterior aspect of the collection. IMPRESSION: CT-guided placement of a drainage catheter within the abscess in the lower abdomen/pelvis. Combination of purulent yellow fluid and gas was removed from this collection. There is concern that the drain collection may not communicate with the collection along the right pelvis. Recommend close follow-up imaging to ensure adequate drainage from this catheter. Electronically Signed   By: Richarda Overlie M.D.   On: 06/10/2017 17:06    Anti-infectives: Anti-infectives (From admission, onward)   Start     Dose/Rate Route Frequency Ordered Stop   06/10/17 1600  piperacillin-tazobactam (ZOSYN) IVPB 3.375  g     3.375 g 12.5 mL/hr over 240 Minutes Intravenous Every 8 hours 06/10/17 1412     06/10/17 0730  piperacillin-tazobactam (ZOSYN) IVPB 3.375 g     3.375 g 100 mL/hr over 30 Minutes Intravenous  Once 06/10/17 0551 06/10/17 1059   06/10/17 0115  piperacillin-tazobactam (ZOSYN) IVPB 3.375 g     3.375 g 100 mL/hr over 30 Minutes Intravenous  Once 06/10/17 0109 06/10/17 0210       Assessment/Plan HTN - hold home meds due to hypotension GERD -  protonix Anxiety - xanax PRN at bedtime Symptomatic anemia - Hg 8 from 6.8 after 1 unit PRBCs 4/30, monitor AKI - CMP pending  Pelvic abscess with small intestine perforation s/p laparoscopic lysis of adhesions, small bowel resection with anastomosis x2, 30cm^2 placement of negative pressure wound dressing, rigid proctoscopy 4/11 Dr. Sheliah Hatch - discharged 4/18, seen in clinic yesterday with purulent drainage coming from prior drain site, tachycardia, and hypotension and she was sent to the ED - CT today shows Recurrent abscess in the right low pelvis post catheter removal (3.7 x 11.7 cm); there is a sinus tract extending to the right lower quadrant with adjacent subcutaneous gas - s/p perc drain 4/30, culture pending. 105cc output last 24 hours - WBC 10, afebrile  ID - zosyn 4/30>> FEN - regular diet VTE - SCDs, will start chemical DVT prophylaxis tomorrow if hg stable Foley - none  Plan - CMP/P/Mag pending. Continue drain and IV antibiotics. Ok for regular diet.   LOS: 1 day    Franne Forts , [facility] 06/11/2017, 7:24 AM Pager: [phone] Consults: [phone] Mon-Fri 7:00 am-4:30 pm Sat-Sun 7:00 am-11:30 am

## 2017-06-11 NOTE — Progress Notes (Addendum)
Referring Physician(s): White,C  Supervising Physician: Jolaine Click  Patient Status:  Kim Snow  Chief Complaint:  Pelvic abscess  Subjective: Pt doing ok; has had BM; denies N/V; did have some some abd discomfort with BM; asking when she can go home   Allergies: Patient has no known allergies.  Medications: Prior to Admission medications   Medication Sig Start Date End Date Taking? Authorizing Provider  ALPRAZolam Prudy Feeler) 1 MG tablet Take 1 mg by mouth at bedtime as needed for sleep.  02/11/17  Yes [provider]  CALCIUM PO Take 1,000 mg by mouth daily.   Yes [provider]  Cyanocobalamin (VITAMIN B 12 PO) Take 1 tablet by mouth daily.   Yes [provider]  losartan-hydrochlorothiazide (HYZAAR) 100-25 MG tablet Take 1 tablet by mouth daily. 03/11/17  Yes [provider]  meloxicam (MOBIC) 15 MG tablet Take 15 mg by mouth daily at 12 noon. 04/21/17  Yes [provider]  omeprazole (PRILOSEC) 40 MG capsule Take 40 mg by mouth daily. 03/28/17  Yes [provider]  Polysaccharide Iron Complex (IFEREX 150 PO) Take 150 mg by mouth daily.   Yes [provider]  traMADol (ULTRAM) 50 MG tablet Take 1 tablet (50 mg total) by mouth every 6 (six) hours. Patient taking differently: Take 100 mg by mouth 2 (two) times daily.  05/29/17  Yes Focht, Joyce Copa, PA  acetaminophen (TYLENOL) 325 MG tablet Take 2 tablets (650 mg total) by mouth every 6 (six) hours as needed for mild pain (or temp > 100). Patient not taking: Reported on 06/10/2017 05/11/17   Berna Bue, MD  amoxicillin-clavulanate (AUGMENTIN) 875-125 MG tablet Take 1 tablet by mouth 2 (two) times daily.  06/09/17   [provider]  docusate sodium (COLACE) 100 MG capsule Take 1 capsule (100 mg total) by mouth 2 (two) times daily. Patient not taking: Reported on 06/10/2017 05/11/17   Berna Bue, MD  oxyCODONE (OXY IR/ROXICODONE) 5 MG immediate release  tablet Take 1 tablet (5 mg total) by mouth every 6 (six) hours as needed (  for moderate pain,  for severe pain). Patient not taking: Reported on 06/10/2017 05/29/17   Jerre Simon, PA     Vital Signs: BP 119/72 (BP Location: Left Arm)   Pulse 93   Temp 97.9 F (36.6 C) (Oral)   Resp 18   Ht  (1.651 m)   Wt 159 lb 9.8 oz (72.4 kg)   SpO2 99%   BMI 26.56 kg/m   Physical Exam alert; mid pelvic drain intact, dressing dry, site not sig tender, output 105 cc yesterday, 15 cc today feculent appearing fluid; cx pend  Imaging: Ct Abdomen Pelvis W Contrast  Result Date: 06/10/2017 CLINICAL DATA:  Dizziness and hypotension since removal of drain 3 days ago. White cell count 18.9. EXAM: CT ABDOMEN AND PELVIS WITH CONTRAST TECHNIQUE: Multidetector CT imaging of the abdomen and pelvis was performed using the standard protocol following bolus administration of intravenous contrast. CONTRAST:  80mL ISOVUE-300 IOPAMIDOL (ISOVUE-300) INJECTION 61% COMPARISON:  05/20/2017 FINDINGS: Lower chest: Lung bases are clear. Hepatobiliary: No focal liver abnormality is seen. Status post cholecystectomy. No biliary dilatation. Pancreas: Unremarkable. No pancreatic ductal dilatation or surrounding inflammatory changes. Spleen: Normal in size without focal abnormality. Adrenals/Urinary Tract: Adrenal glands are unremarkable. Kidneys are normal, without renal calculi, focal lesion, or hydronephrosis. Bladder is unremarkable. Stomach/Bowel: Stomach, small bowel, and colon are not abnormally distended. Surgical anastomoses demonstrated in the  right: And right lower quadrant bowel. Diverticulosis of the colon. Since the previous study a trans sciatic drainage catheter has been removed. There appears to be recurrent loculated fluid, likely abscess arising in the previous catheter site and along the low pelvis. Fluid collection measures about 3.7 x 11.7 cm. There is a new sinus tract versus ileostomy in the right  lower quadrant. This is decompressed. Subcutaneous emphysema adjacent to the ileostomy. Vascular/Lymphatic: No significant vascular findings are present. No enlarged abdominal or pelvic lymph nodes. Reproductive: Uterus and bilateral adnexa are unremarkable. Other: No abdominal wall hernia or abnormality. No abdominopelvic ascites. Musculoskeletal: No acute or significant osseous findings. IMPRESSION: 1. Recurrent abscess in the right low pelvis post catheter removal. 2. New since the previous study, there is a sinus tract versus ileostomy extending to the right lower quadrant with adjacent subcutaneous gas. Correlation with surgical history recommended. 3. Diverticulosis of the sigmoid colon.  No bowel obstruction. Electronically Signed   By: Burman Nieves M.D.   On: 06/10/2017 02:28   Ct Image Guided Fluid Drain By Catheter  Result Date: 06/10/2017 INDICATION: 57 year old with history a partial small-bowel resection and lysis of adhesions related to a small bowel perforation. Patient has developed purulent output from an old surgical drain site. Recent CT imaging demonstrates a complex abscess in the lower abdomen and pelvis. EXAM: CT-GUIDED DRAIN PLACEMENT WITHIN THE ABDOMINAL/PELVIC ABSCESS COLLECTION MEDICATIONS: The patient is currently admitted to the hospital and receiving intravenous antibiotics. The antibiotics were administered within an appropriate time frame prior to the initiation of the procedure. ANESTHESIA/SEDATION: Fentanyl 100 mcg IV; Versed 4.0 mg IV Moderate Sedation Time:  30 minutes The patient was continuously monitored during the procedure by the interventional radiology nurse under my direct supervision. COMPLICATIONS: None immediate. PROCEDURE: Informed written consent was obtained from the patient after a thorough discussion of the procedural risks, benefits and alternatives. All questions were addressed. A timeout was performed prior to the initiation of the procedure. Patient was  placed supine on the CT scanner. Images through the lower abdomen and pelvis were obtained. Gas containing collection in the lower abdomen and pelvis was identified. The lower abdomen was prepped with chlorhexidine and sterile field was created. Skin and soft tissues were anesthetized with 1% lidocaine. An 18 gauge coaxial needle was directed into this complex air-fluid collection with CT guidance. Gas and purulent yellow fluid was aspirated. Stiff Amplatz wire was advanced into the collection. Tract was dilated to accommodate a 10 [place] drain. Approximately 15 mL of yellow purulent fluid was aspirated along with gas. Drain was secured to the skin with a suture and adhesive device. Drain was attached to a suction bulb. Fluid sample was sent for culture. FINDINGS: Complex air-fluid collection in the lower abdomen and pelvis adjacent to the uterus and left adnexa. The wire and drain coiled into a small cavity within the central abdomen. Wire and catheter did not cross the midline into the more posterior aspect of the collection. IMPRESSION: CT-guided placement of a drainage catheter within the abscess in the lower abdomen/pelvis. Combination of purulent yellow fluid and gas was removed from this collection. There is concern that the drain collection may not communicate with the collection along the right pelvis. Recommend close follow-up imaging to ensure adequate drainage from this catheter. Electronically Signed   By: Richarda Overlie M.D.   On: 06/10/2017 17:06    Labs:  CBC: Recent Labs    06/09/17 1710 06/10/17 0131 06/10/17 0853 06/11/17 0701  WBC 18.9*  15.0* 14.3* 10.0  HGB 8.0* 7.4* 6.8* 8.0*  HCT 24.9* 22.4* 20.8* 24.8*  PLT 793* 628* 627* 569*    COAGS: Recent Labs    05/06/17 0539 06/10/17 1142  INR 1.04 1.04  APTT 29  --     BMP: Recent Labs    05/23/17 0441 05/29/17 0416 06/09/17 1710 06/10/17 0853 06/11/17 0701  NA 139 141 134*  --  138  K 4.9 4.0 3.8  --  3.8  CL 103 102  97*  --  104  CO2 --  25  GLUCOSE 133* 96 132*  --  107*  BUN 12 5* 29*  --  9  CALCIUM 8.6* 8.8* 9.2  --  8.4*  CREATININE 0.86 0.81 1.22* 0.94 0.64  GFRNONAA >60 >60 48* >60 >60  GFRAA >60 >60 56* >60 >60    LIVER FUNCTION TESTS: Recent Labs    05/07/17 0452 05/21/17 1432 06/10/17 0131 06/11/17 0701  BILITOT 1.0 0.3 0.6 0.6  AST 90*  ALT 49  ALKPHOS 125 161* 255* 364*  PROT 6.5 7.9 6.6 6.0*  ALBUMIN 2.3* 3.4* 2.1* 1.8*    Assessment and Plan: Pt with recurrent pelvic abscess, s/p SB resection secondary to perf/LOA 05/22/17; s/p pelvic abscess drain placement 4/30; afebrile; WBC/creat nl; hgb 8.0; drain fluid cx pend; cont current tx/drain irrigation; check f/u CT within 1 week of drain placement; will likely also need drain injection before removal   Electronically Signed: D. Jeananne Rama, PA-C 06/11/2017, 10:20 AM   I spent a total of 15 minutes at the the patient's bedside AND on the patient's hospital floor or unit, greater than 50% of which was counseling/coordinating care for pelvic abscess drain    Patient ID: Kim Snow, female   DOB: 05/23/1960, 57 y.o.   MRN: [id number]

## 2017-06-12 ENCOUNTER — Other Ambulatory Visit: Payer: Self-pay | Admitting: General Surgery

## 2017-06-12 DIAGNOSIS — T8149XA Infection following a procedure, other surgical site, initial encounter: Principal | ICD-10-CM

## 2017-06-12 DIAGNOSIS — T8143XA Infection following a procedure, organ and space surgical site, initial encounter: Secondary | ICD-10-CM

## 2017-06-12 LAB — COMPREHENSIVE METABOLIC PANEL
ALBUMIN: 1.8 g/dL — AB (ref 3.5–5.0)
ALT: 42 U/L (ref 14–54)
AST: 39 U/L (ref 15–41)
Alkaline Phosphatase: 336 U/L — ABNORMAL HIGH (ref 38–126)
Anion gap: 10 (ref 5–15)
BUN: 5 mg/dL — AB (ref 6–20)
CHLORIDE: 103 mmol/L (ref 101–111)
CO2: 26 mmol/L (ref 22–32)
Calcium: 8.5 mg/dL — ABNORMAL LOW (ref 8.9–10.3)
Creatinine, Ser: 0.72 mg/dL (ref 0.44–1.00)
GFR calc Af Amer: >60 mL/min (ref >60)
GFR calc non Af Amer: >60 mL/min (ref >60)
GLUCOSE: 116 mg/dL — AB (ref 65–99)
POTASSIUM: 4.1 mmol/L (ref 3.5–5.1)
SODIUM: 139 mmol/L (ref 135–145)
Total Bilirubin: 0.3 mg/dL (ref 0.3–1.2)
Total Protein: 6.1 g/dL — ABNORMAL LOW (ref 6.5–8.1)

## 2017-06-12 LAB — MAGNESIUM: Magnesium: 1.6 mg/dL — ABNORMAL LOW (ref 1.7–2.4)

## 2017-06-12 MED ORDER — OXYCODONE HCL 5 MG PO TABS: 5.0000 mg | ORAL_TABLET | Freq: Four times a day (QID) | ORAL | 0 refills | Status: DC | PRN

## 2017-06-12 MED ORDER — ONDANSETRON 4 MG PO TBDP: 4.0000 mg | ORAL_TABLET | Freq: Four times a day (QID) | ORAL | 0 refills | Status: AC | PRN

## 2017-06-12 MED ORDER — METRONIDAZOLE 500 MG PO TABS: 500.0000 mg | ORAL_TABLET | Freq: Three times a day (TID) | ORAL | 0 refills | Status: AC

## 2017-06-12 MED ORDER — SODIUM CHLORIDE 0.9% FLUSH: 5.0000 mL | Freq: Two times a day (BID) | INTRAVENOUS | 1 refills | Status: DC

## 2017-06-12 MED ORDER — CIPROFLOXACIN HCL 500 MG PO TABS: 500.0000 mg | ORAL_TABLET | Freq: Two times a day (BID) | ORAL | 0 refills | Status: AC

## 2017-06-12 MED ORDER — ONDANSETRON 4 MG PO TBDP: 4.0000 mg | ORAL_TABLET | Freq: Four times a day (QID) | ORAL | 0 refills | Status: DC | PRN

## 2017-06-12 MED ORDER — SODIUM CHLORIDE 0.9% FLUSH: 5.0000 mL | Freq: Two times a day (BID) | INTRAVENOUS | 0 refills | Status: AC

## 2017-06-12 NOTE — Discharge Summary (Signed)
[facility] Discharge Summary   Patient ID: Kim Snow MRN: [id number] DOB/AGE: [birth year] 57 y.o.  Admit date: 06/10/2017 Discharge date: 06/12/2017  Admitting Diagnosis: Pelvic abscess  Discharge Diagnosis Patient Active Problem List   Diagnosis Date Noted  . Postprocedural intraabdominal abscess 06/10/2017  . Perforation of small intestines s/p SB resection x 2 05/22/2017 05/25/2017  . Abdominopelvic abscess s/p OR drainage 05/25/2017  . Pneumoperitoneum 05/23/2017  . Diverticulitis large intestine 05/21/2017    Consultants Interventional radiology  Imaging: Ct Image Guided Fluid Drain By Catheter  Result Date: 06/10/2017 INDICATION: 57 year old with history a partial small-bowel resection and lysis of adhesions related to a small bowel perforation. Patient has developed purulent output from an old surgical drain site. Recent CT imaging demonstrates a complex abscess in the lower abdomen and pelvis. EXAM: CT-GUIDED DRAIN PLACEMENT WITHIN THE ABDOMINAL/PELVIC ABSCESS COLLECTION MEDICATIONS: The patient is currently admitted to the hospital and receiving intravenous antibiotics. The antibiotics were administered within an appropriate time frame prior to the initiation of the procedure. ANESTHESIA/SEDATION: Fentanyl 100 mcg IV; Versed 4.0 mg IV Moderate Sedation Time:  30 minutes The patient was continuously monitored during the procedure by the interventional radiology nurse under my direct supervision. COMPLICATIONS: None immediate. PROCEDURE: Informed written consent was obtained from the patient after a thorough discussion of the procedural risks, benefits and alternatives. All questions were addressed. A timeout was performed prior to the initiation of the procedure. Patient was placed supine on the CT scanner. Images through the lower abdomen and pelvis were obtained. Gas containing collection in the lower abdomen and pelvis was identified. The lower abdomen was  prepped with chlorhexidine and sterile field was created. Skin and soft tissues were anesthetized with 1% lidocaine. An 18 gauge coaxial needle was directed into this complex air-fluid collection with CT guidance. Gas and purulent yellow fluid was aspirated. Stiff Amplatz wire was advanced into the collection. Tract was dilated to accommodate a 10 [place] drain. Approximately 15 mL of yellow purulent fluid was aspirated along with gas. Drain was secured to the skin with a suture and adhesive device. Drain was attached to a suction bulb. Fluid sample was sent for culture. FINDINGS: Complex air-fluid collection in the lower abdomen and pelvis adjacent to the uterus and left adnexa. The wire and drain coiled into a small cavity within the central abdomen. Wire and catheter did not cross the midline into the more posterior aspect of the collection. IMPRESSION: CT-guided placement of a drainage catheter within the abscess in the lower abdomen/pelvis. Combination of purulent yellow fluid and gas was removed from this collection. There is concern that the drain collection may not communicate with the collection along the right pelvis. Recommend close follow-up imaging to ensure adequate drainage from this catheter. Electronically Signed   By: Kim Snow M.D.   On: 06/10/2017 17:06    Procedures Dr. Lowella Snow (06/10/17) - CT guided placement of drain within lower abdominal abscess  Hospital Course:  Kim Snow is a 57yo female nearly 3 weeks out from laparoscopic lysis of adhesions, small bowel resection with anastomosis x2, 30cm^2 placement of negative pressure wound dressing, rigid proctoscopy 05/22/17 by Dr. Sheliah Snow for Pelvic abscess with small intestine perforation. She was initially admitted to the hospital on 05/05/17 for perforated diverticulitis with abscess; she underwent percutaneous drain by IR on 05/06/17 and was discharged home on 05/11/17. Patient followed up with IR and drain was removed on 4/18.  States that she was doing well until this weekend.  She reports noticing foul-smelling, purulent drainage from previous drain site as well as subjective fevers and intermittent abdominal pain. Kim Snow was seen in our clinic 4/29 and found to be hypotensive and tachycardic. She was advised to go to the ED. Workup included CT scan which showed recurrent abscess in the right low pelvis post catheter removal (3.7 x 11.7 cm). Patient was admitted for IV antibiotics and percutaneous drainage of pelvic abscess. Tolerated procedure well. She did require 1 unit PRBCs for symptomatic anemia; hemoglobin rose appropriately. WBC normalized and vital signs improved  Diet was advanced as tolerated.  On 5/2, the patient was voiding well, tolerating diet, ambulating well, pain well controlled, vital signs stable, comfortable with drain care and felt stable for discharge home.  She will go home on 10 days of cipro/flagyl. Culture pending at time of discharge. Patient will follow up with Dr. Sheliah Snow and the drain clinic, and knows to call with questions or concerns.    I have personally reviewed the patients medication history on the [place] controlled substance database.    Physical Exam: Gen:  Alert, NAD, pleasant HEENT: EOM's intact, pupils equal and round Pulm:  effort normal Abd: healing lower transverse incisioncdi, soft,ND,previous drain site in RLQ with trace drainage on palpation (decreased amount from yesterday)/no surrounding erythema, drain with purulent fluid in bulb Ext:  Calves soft and nontender   Allergies as of 06/12/2017   No Known Allergies     Medication List    STOP taking these medications   amoxicillin-clavulanate 875-125 MG tablet Commonly known as:  AUGMENTIN   traMADol 50 MG tablet Commonly known as:  ULTRAM     TAKE these medications   acetaminophen 325 MG tablet Commonly known as:  TYLENOL Take 2 tablets (650 mg total) by mouth every 6 (six) hours as needed for mild pain (or  temp > 100).   ALPRAZolam 1 MG tablet Commonly known as:  XANAX Take 1 mg by mouth at bedtime as needed for sleep.   CALCIUM PO Take 1,000 mg by mouth daily.   ciprofloxacin 500 MG tablet Commonly known as:  CIPRO Take 1 tablet (500 mg total) by mouth 2 (two) times daily for 10 days.   docusate sodium 100 MG capsule Commonly known as:  COLACE Take 1 capsule (100 mg total) by mouth 2 (two) times daily.   IFEREX 150 PO Take 150 mg by mouth daily.   losartan-hydrochlorothiazide 100-25 MG tablet Commonly known as:  HYZAAR Take 1 tablet by mouth daily.   meloxicam 15 MG tablet Commonly known as:  MOBIC Take 15 mg by mouth daily at 12 noon.   metroNIDAZOLE 500 MG tablet Commonly known as:  FLAGYL Take 1 tablet (500 mg total) by mouth 3 (three) times daily for 10 days.   omeprazole 40 MG capsule Commonly known as:  PRILOSEC Take 40 mg by mouth daily.   ondansetron 4 MG disintegrating tablet Commonly known as:  ZOFRAN-ODT Take 1 tablet (4 mg total) by mouth every 6 (six) hours as needed for nausea or vomiting.   oxyCODONE 5 MG immediate release tablet Commonly known as:  Oxy IR/ROXICODONE Take 1-2 tablets (5-10 mg total) by mouth every 6 (six) hours as needed for severe pain. What changed:    how much to take  reasons to take this   sodium chloride flush 0.9 % Soln Commonly known as:  NS 5 mLs by Intracatheter route 2 (two) times daily for 14 days.   VITAMIN B 12 PO  Take 1 tablet by mouth daily.        Follow-up Information    Kinsinger, De Blanch, MD. Call in 1 week(s).   Specialty:  General Surgery Why:  We are working on your appointment, please call to confirm. Please arrive 30 minutes prior to your appointment to check in and fill out paperwork. Bring photo ID and insurance information. Contact information: [address] [place] [address] [phone]           Signed: Franne Forts, [facility] 06/12/2017,  7:56 AM Pager: [phone] Consults: [phone] Mon-Fri 7:00 am-4:30 pm Sat-Sun 7:00 am-11:30 am

## 2017-06-12 NOTE — Discharge Instructions (Signed)
Surgical Drain Home Care °Surgical drains are used to remove extra fluid that normally builds up in a surgical wound after surgery. A surgical drain helps to heal a surgical wound. Different kinds of surgical drains include: °· Active drains. These drains use suction to pull drainage away from the surgical wound. Drainage flows through a tube to a container outside of the body. It is important to keep the bulb or the drainage container flat (compressed) at all times, except while you empty it. Flattening the bulb or container creates suction. The two most common types of active drains are bulb drains and Hemovac drains. °· Passive drains. These drains allow fluid to drain naturally, by gravity. Drainage flows through a tube to a bandage (dressing) or a container outside of the body. Passive drains do not need to be emptied. The most common type of passive drain is the Penrose drain. ° °A drain is placed during surgery. Immediately after surgery, drainage is usually bright red and a little thicker than water. The drainage may gradually turn yellow or pink and become thinner. It is likely that your health care provider will remove the drain when the drainage stops or when the amount decreases to 1-2 Tbsp (15-30 mL) during a 24-hour period. °How to care for your surgical drain °· Keep the skin around the drain dry and covered with a dressing at all times. °· Check your drain area every day for signs of infection. Check for: °? More redness, swelling, or pain. °? Pus or a bad smell. °? Cloudy drainage. °Follow instructions from your health care provider about how to take care of your drain and how to change your dressing. Change your dressing at least one time every day. Change it more often if needed to keep the dressing dry. Make sure you: °1. Gather your supplies, including: °? Tape. °? Germ-free cleaning solution (sterile saline). °? Split gauze drain sponge: 4 x 4 inches (10 x 10 cm). °? Gauze square: 4 x 4 inches  (10 x 10 cm). °2. Wash your hands with soap and water before you change your dressing. If soap and water are not available, use hand sanitizer. °3. Remove the old dressing. Avoid using scissors to do that. °4. Use sterile saline to clean your skin around the drain. °5. Place the tube through the slit in a drain sponge. Place the drain sponge so that it covers your wound. °6. Place the gauze square or another drain sponge on top of the drain sponge that is on the wound. Make sure the tube is between those layers. °7. Tape the dressing to your skin. °8. If you have an active bulb or Hemovac drain, tape the drainage tube to your skin 1-2 inches (2.5-5 cm) below the place where the tube enters your body. Taping keeps the tube from pulling on any stitches (sutures) that you have. °9. Wash your hands with soap and water. °10. Write down the color of your drainage and how often you change your dressing. ° °How to empty your active bulb or Hemovac drain °1. Make sure that you have a measuring cup that you can empty your drainage into. °2. Wash your hands with soap and water. If soap and water are not available, use hand sanitizer. °3. Gently move your fingers down the tube while squeezing very lightly. This is called stripping the tube. This clears any drainage, clots, or tissue from the tube. °? Do not pull on the tube. °? You may need to strip   the tube several times every day to keep the tube clear. °4. Open the bulb cap or the drain plug. Do not touch the inside of the cap or the bottom of the plug. °5. Empty all of the drainage into the measuring cup. °6. Compress the bulb or the container and replace the cap or the plug. To compress the bulb or the container, squeeze it firmly in the middle while you close the cap or plug the container. °7. Write down the amount of drainage that you have in each 24-hour period. If you have less than 2 Tbsp (30 mL) of drainage during 24 hours, contact your health care  provider. °8. Flush the drainage down the toilet. °9. Wash your hands with soap and water. °Contact a health care provider if: °· You have more redness, swelling, or pain around your drain area. °· The amount of drainage that you have is increasing instead of decreasing. °· You have pus or a bad smell coming from your drain area. °· You have a fever. °· You have drainage that is cloudy. °· There is a sudden stop or a sudden decrease in the amount of drainage that you have. °· Your tube falls out. °· Your active drain does not stay compressed after you empty it. °This information is not intended to replace advice given to you by your health care provider. Make sure you discuss any questions you have with your health care provider. °Document Released: 01/26/2000 Document Revised: 07/06/2015 Document Reviewed: 08/17/2014 °Elsevier Interactive Patient Education © 2018 [employer] ° °

## 2017-06-13 LAB — AEROBIC/ANAEROBIC CULTURE (SURGICAL/DEEP WOUND): SPECIAL REQUESTS: NORMAL

## 2017-06-13 LAB — AEROBIC/ANAEROBIC CULTURE W GRAM STAIN (SURGICAL/DEEP WOUND)

## 2017-06-15 LAB — CULTURE, BLOOD (ROUTINE X 2)
CULTURE: NO GROWTH
Culture: NO GROWTH
SPECIAL REQUESTS: ADEQUATE
Special Requests: ADEQUATE

## 2017-06-18 ENCOUNTER — Ambulatory Visit
Admission: RE | Admit: 2017-06-18 | Discharge: 2017-06-18 | Disposition: A | Discharge status: Home / Self Care | Payer: BC Managed Care – PPO | Source: Ambulatory Visit | Attending: General Surgery | Admitting: General Surgery

## 2017-06-18 ENCOUNTER — Other Ambulatory Visit: Payer: Self-pay | Admitting: General Surgery

## 2017-06-18 ENCOUNTER — Encounter: Payer: Self-pay | Admitting: Radiology

## 2017-06-18 ENCOUNTER — Ambulatory Visit
Admission: RE | Admit: 2017-06-18 | Discharge: 2017-06-18 | Disposition: A | Discharge status: Home / Self Care | Payer: BC Managed Care – PPO | Source: Ambulatory Visit | Attending: Radiology | Admitting: Radiology

## 2017-06-18 DIAGNOSIS — T8149XA Infection following a procedure, other surgical site, initial encounter: Principal | ICD-10-CM

## 2017-06-18 DIAGNOSIS — T8143XA Infection following a procedure, organ and space surgical site, initial encounter: Secondary | ICD-10-CM

## 2017-06-18 HISTORY — PX: IR RADIOLOGIST EVAL & MGMT: IMG5224

## 2017-06-18 IMAGING — RF DG SINUS / FISTULA TRACT / ABSCESSOGRAM
2 series · 6 of 6 positions shown · non-contrast
Comparison: none

CLINICAL DATA: Postprocedural intraabdominal abscess

PROCEDURE:
ABSCESS DRAIN CATHETER INJECTION UNDER FLUOROSCOPY
TECHNIQUE: The procedure, risks (including but not limited to bleeding,
infection, organ damage ), benefits, and alternatives were explained
to the patient. Questions regarding the procedure were encouraged
and answered. The patient understands and consents to the procedure.

[Series 1: sequence · 4 of 71 frames shown]
[frame 1/71]
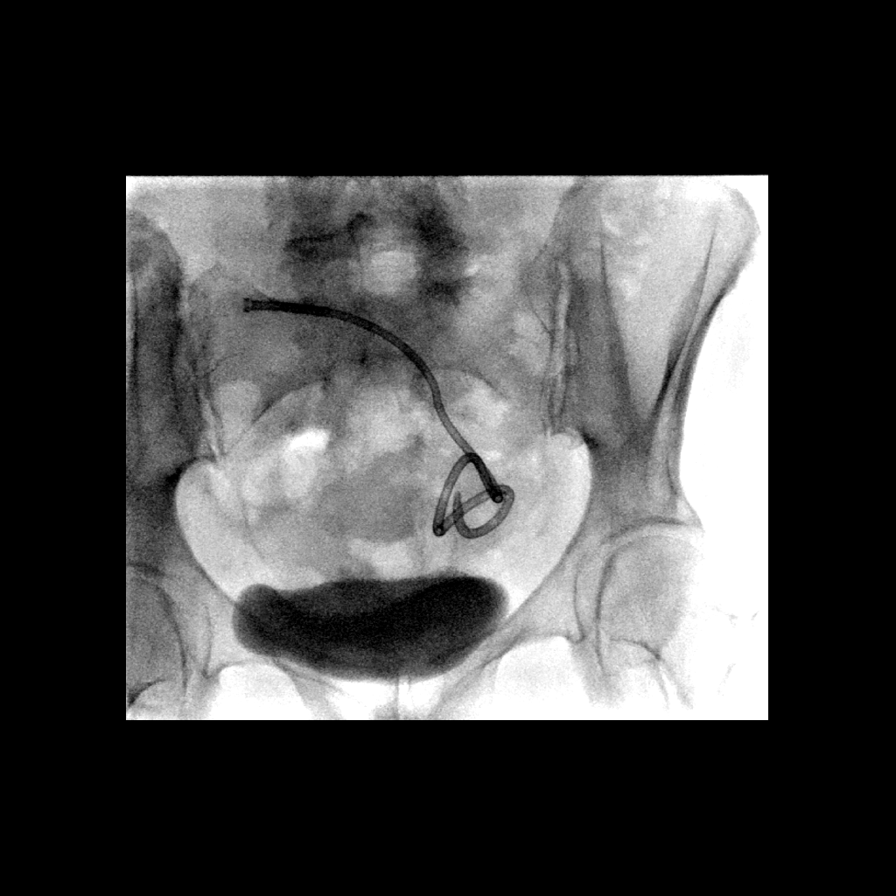
[frame 11/71]
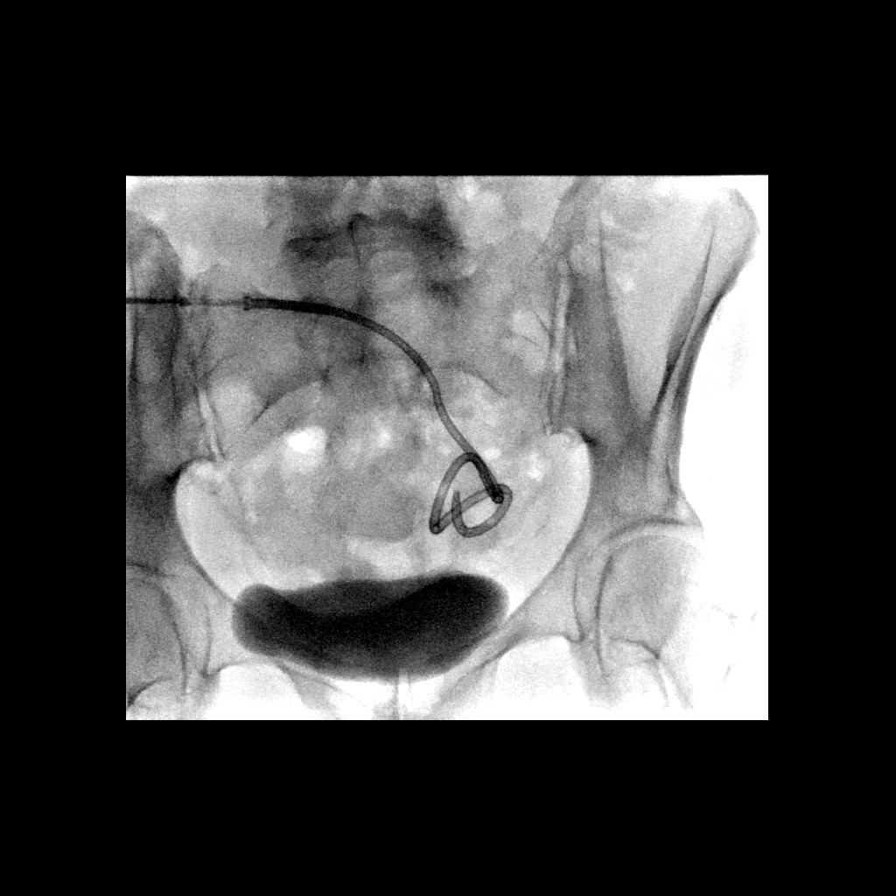
[frame 36/71]
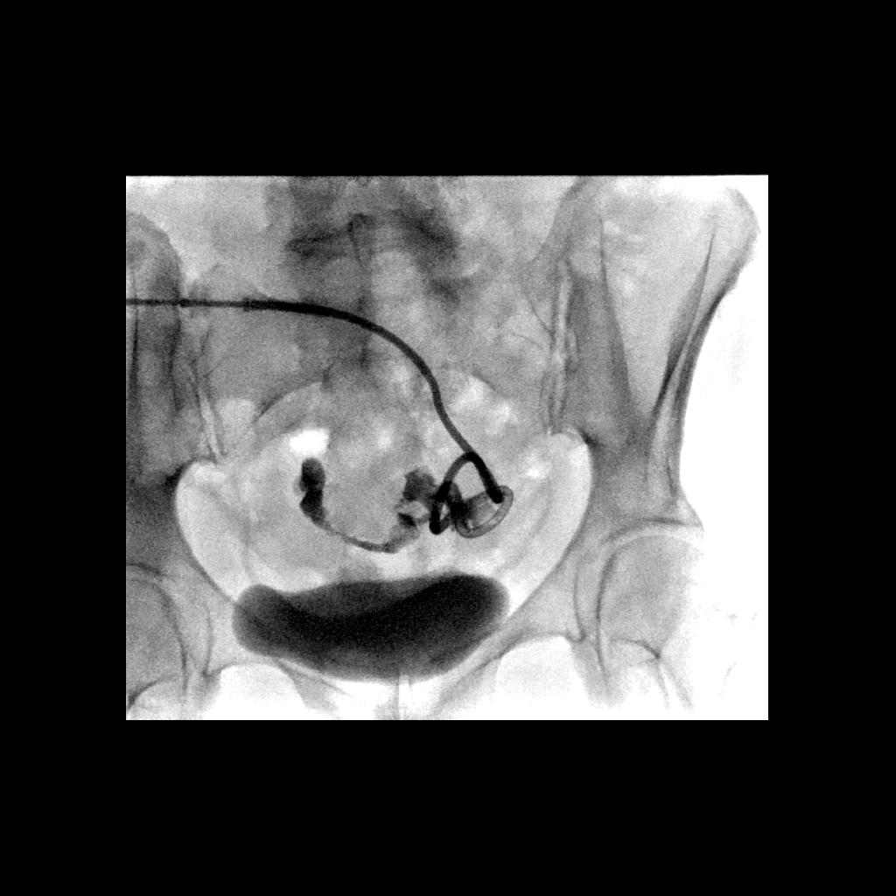
[frame 61/71]
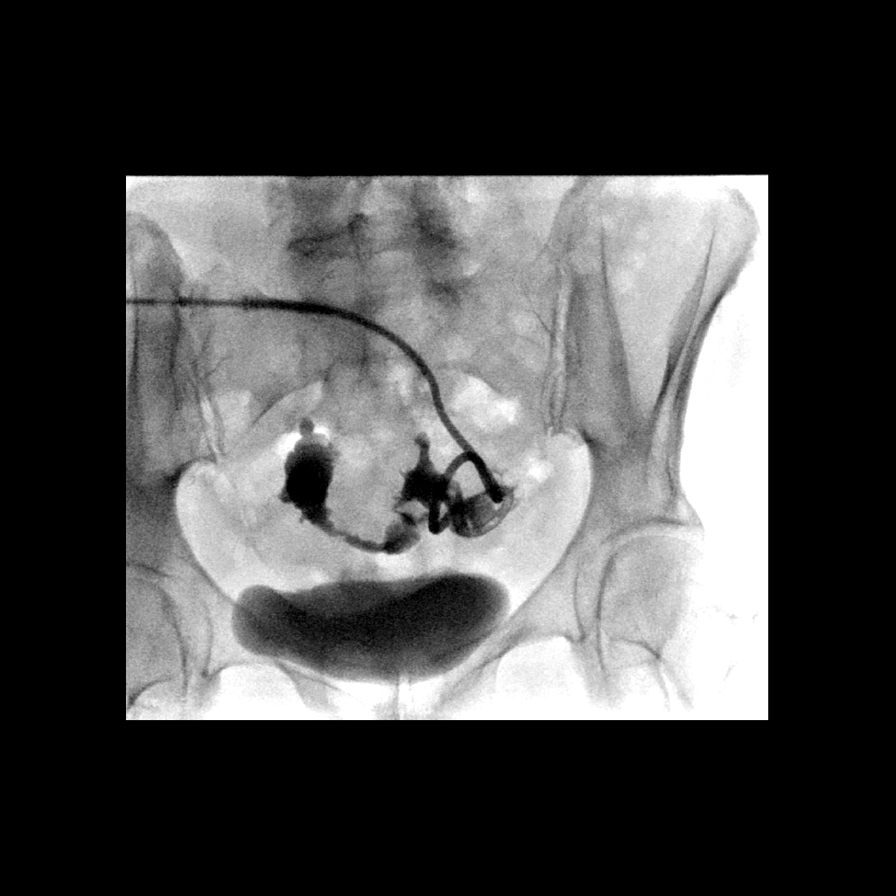

[Series 2: one shot · 2 of 2 slices shown]
[im 1/2]
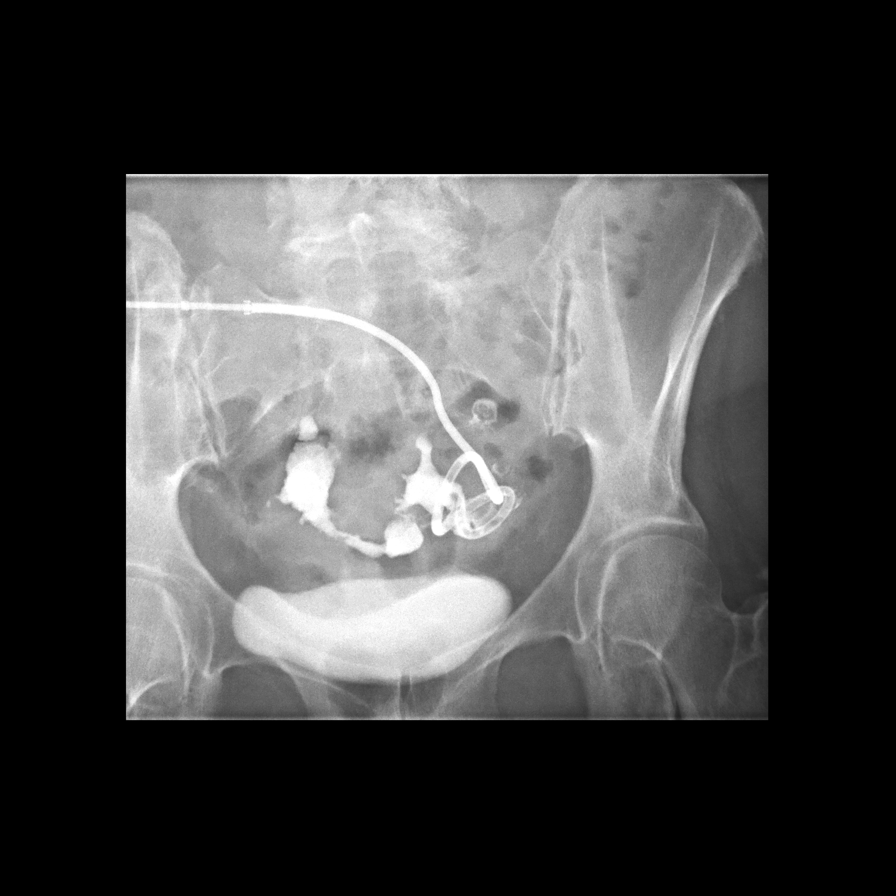
[im 2/2]
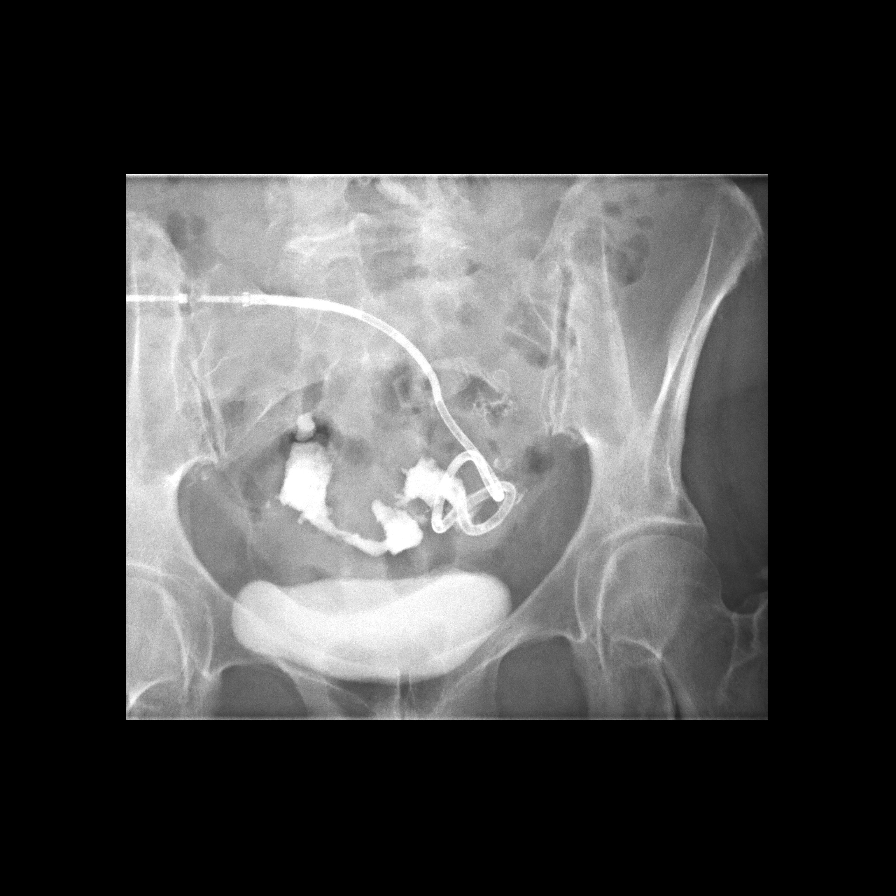

[6 of 6 positions shown; findings below may reference images not displayed]

Survey fluoroscopic inspection reveals stable position of pigtail
drain catheter in the left pelvis..

Injection demonstrates filling of a complex extraluminal fluid
collection, bilobed, with a thin tract extending across midline to
the right-sided component. No definite intraluminal opacification..

FLUOROSCOPY TIME:  24 seconds; 16 mGy
IMPRESSION: 1. Persistent complex pelvic collection post percutaneous drain
catheter placement. No definite fistula.
Catheter will remain to external drainage with follow-up scheduled
in 2 weeks.

## 2017-06-18 IMAGING — CT CT ABD-PELV W/ CM
2 of 4 series · 13 of 46 positions shown, 15 images · IV contrast (iopamidol)
Comparison: 06/10/2017 and previous

CLINICAL DATA: diverticular abscess, status post percutaneous drain
catheter placement. Decreasing output.

EXAM:
CT ABDOMEN AND PELVIS WITH CONTRAST
TECHNIQUE: Multidetector CT imaging of the abdomen and pelvis was performed
using the standard protocol following bolus administration of
intravenous contrast.
CONTRAST:  100mL I1QXII-EAA IOPAMIDOL (I1QXII-EAA) INJECTION 61%

[Series 2: abd pelvis 5.00 br40 s3 ax · axial · 0.62mm/px · z∈[+1108,+1458]mm · 10 of 86 slices shown, 12 images]
[im 8/86  soft-tissue]
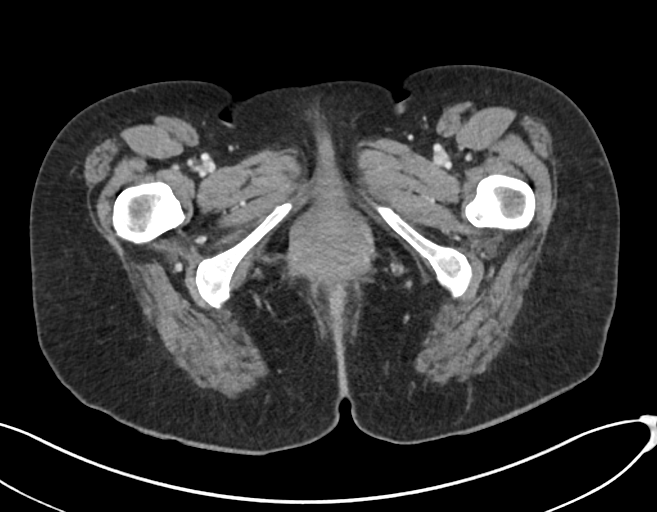
[im 8/86  bone]
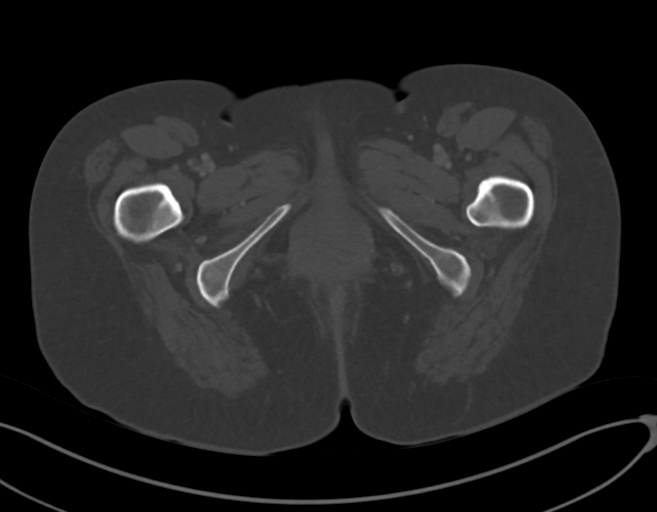
[im 15/86  soft-tissue]
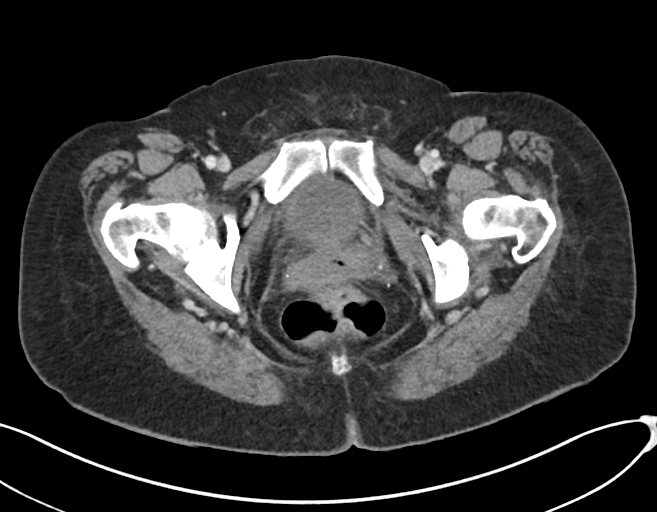
[im 22/86  soft-tissue]
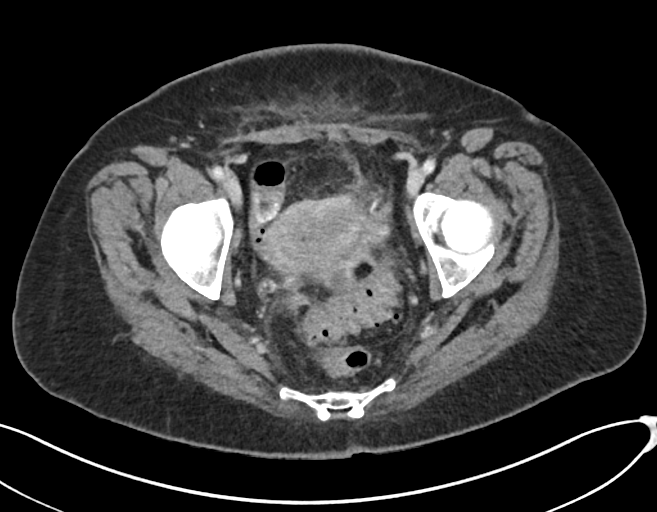
[im 32/86  soft-tissue]
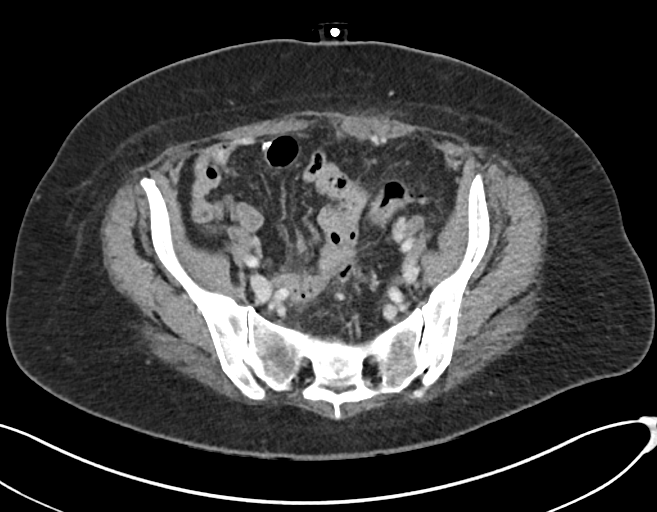
[im 39/86  soft-tissue]
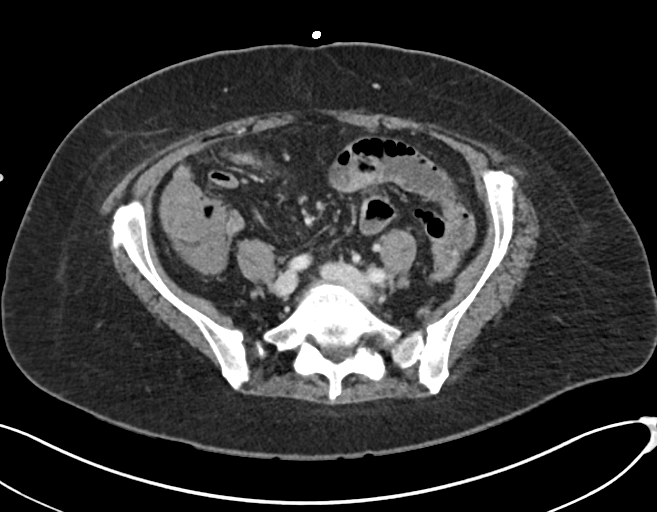
[im 47/86  soft-tissue]
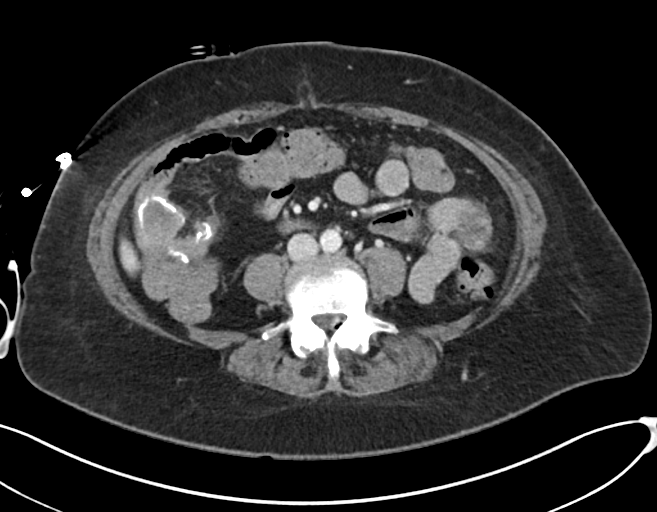
[im 54/86  soft-tissue]
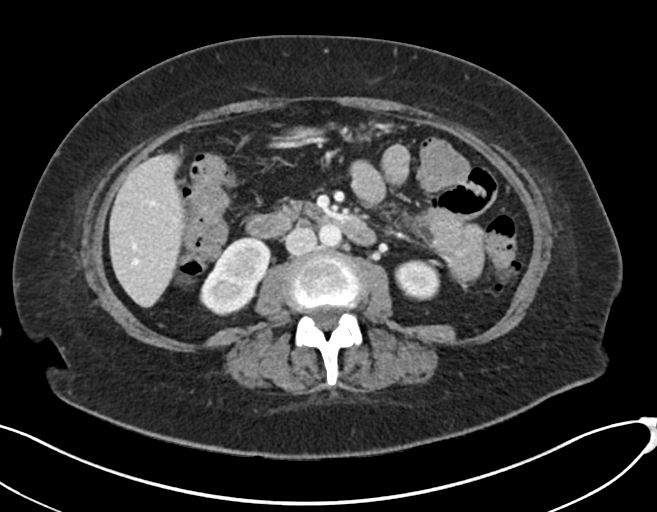
[im 64/86  soft-tissue]
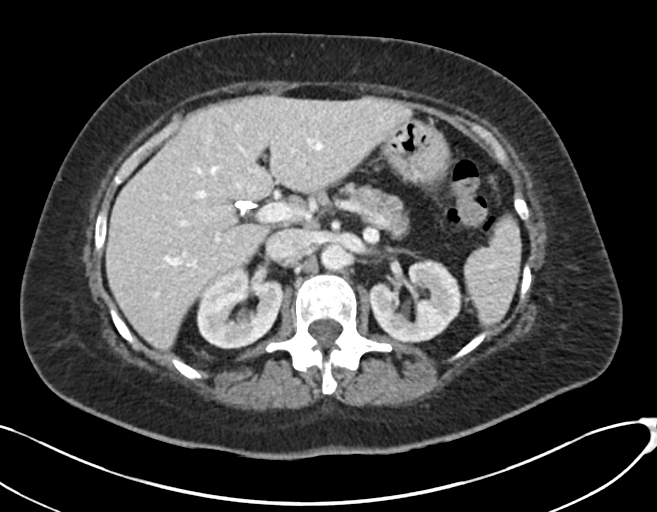
[im 71/86  soft-tissue]
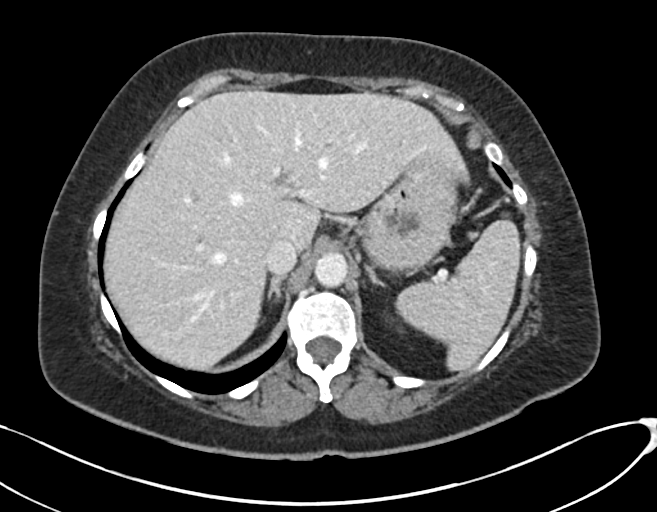
[im 71/86  bone]
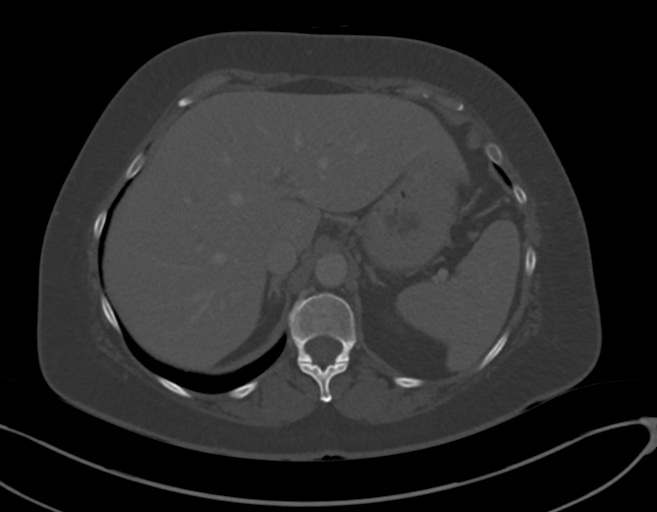
[im 78/86  soft-tissue]
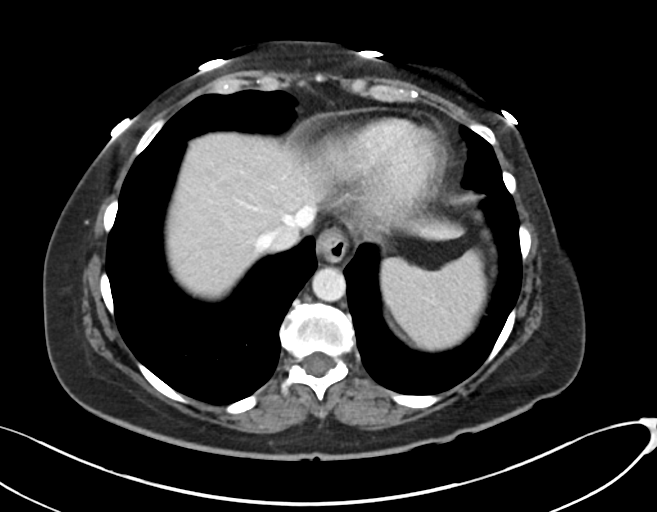

[Series 6: abd pelvis 2.00 br40 s3 cor · coronal · 0.79mm/px · 3 of 157 slices shown]
[im 53/157  soft-tissue]
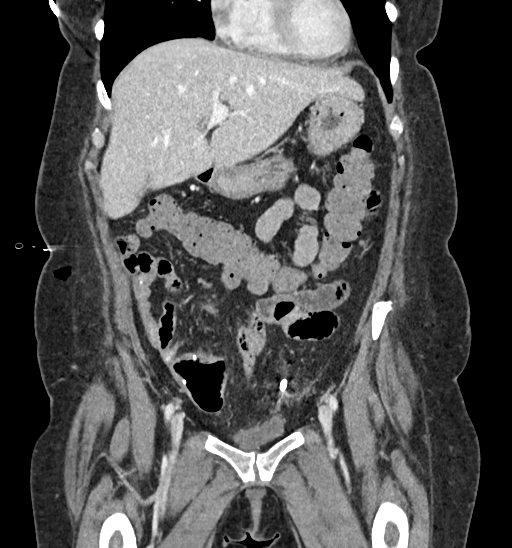
[im 70/157  soft-tissue]
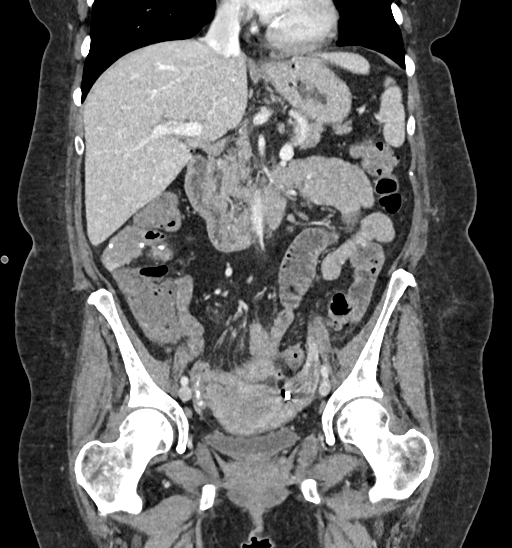
[im 87/157  soft-tissue]
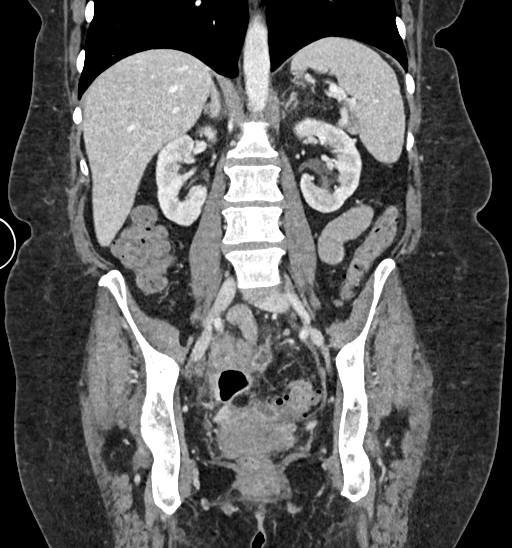

[13 of 46 positions shown; findings below may reference images not displayed]

FINDINGS: Lower chest: No acute abnormality.

Hepatobiliary: No focal liver abnormality is seen. Status post
cholecystectomy. No biliary dilatation.

Pancreas: Unremarkable. No pancreatic ductal dilatation or
surrounding inflammatory changes.

Spleen: Normal in size without focal abnormality.

Adrenals/Urinary Tract: No adrenal hemorrhage or renal injury
identified. Bladder is unremarkable.

Stomach/Bowel: Stomach and small bowel nondistended. Anastomotic
staple lines in the region of the hepatic flexure and right lower
quadrant. Colon nondilated. Scattered descending and sigmoid
diverticula.

Vascular/Lymphatic: Minimal Aortic Atherosclerosis (06BUJ-170.0). No
aneurysm. No abdominal or pelvic adenopathy localized.

Reproductive: Uterus and bilateral adnexa are unremarkable.

Other: No ascites.  No free air.

Percutaneous drain catheter into the left pelvis. Partial interval
decompression of the elongated complex gas and fluid collection seen
previously, extending into the posterior right pelvis. It is
difficult to differentiate decompressed adjacent small bowel from
extraluminal collections in the right lower quadrant and right
pelvis. No new undrained collections.

Musculoskeletal: Facet DJD L3-4 and L4-5 with probable synovial cyst
extending into the spinal canal on the left L3-4. Mild grade 1
anterolisthesis L3-4 probably secondary to facet disease. Negative
for fracture or worrisome bone lesion.
IMPRESSION: 1. Interval partial decompression of complex pelvic fluid collection
post percutaneous drain catheter placement.
I would recommend the use of oral contrast as well as IV contrast on
follow-up scans to help differentiate small-bowel from extraluminal
collections.
2. No new undrained collections.
3. Descending and sigmoid diverticulosis.

## 2017-06-18 MED ORDER — IOPAMIDOL (ISOVUE-300) INJECTION 61%
100.0000 mL | Freq: Once | INTRAVENOUS | Status: AC | PRN
  Administered 2017-06-18: 100 mL via INTRAVENOUS

## 2017-06-18 NOTE — Progress Notes (Signed)
Referring Physician(s): Dr. Feliciana Rossetti  Chief Complaint: The patient is seen in follow up today s/p CT-guided drainage of recurrent abdominal abscess  History of present illness: Kim Snow is a 57 year old female known to IR service from prior pelvic abscess drainage 05/06/17 from small bowel perforation.  She subsequently underwent small bowel resection with anastomosis x2 with surgical drain left in place.  After drain was removed she developed drainage from the tract associated with fevers, nausea, and vomiting and she was determined to have an intra-abdominal/pelvic abscess.  A CT-guided drainage catheter was placed within the abscess 4/30 and she was scheduled for close follow-up.  Patient presents to IR clinic today for CT and drain injection.  She states she has been feeling well at home.  She has been able to eat and drink with tolerance.  She denies fevers, nausea, abdominal pain aside from irritation of the drain itself.  She continues 2 PO abx which will be completed soon.  She reports ~5 mL of cloudy, foul-smelling drainage daily.  She has been flushing twice daily.  She does have scheduled follow-up with Dr. Sheliah Hatch next week.    Past Medical History:  Diagnosis Date  . Anemia   . Anxiety   . Asthma   . Depression   . GERD (gastroesophageal reflux disease)   . Hypertension   . Insomnia   . Migraine     Past Surgical History:  Procedure Laterality Date  . CESAREAN SECTION    . CHOLECYSTECTOMY    . IR RADIOLOGIST EVAL & MGMT  05/20/2017  . KNEE ARTHROSCOPY    . LAPAROSCOPY N/A 05/22/2017   Procedure: LAPAROSCOPY DIAGNOSTIC, LYSIS OF ADHESIONS, DRAINAGE OF ABCESS, RIGID PROCTOSCOPY, SMALL BOWEL RESECTION X2;  Surgeon: Kinsinger, De Blanch, MD;  Location: [facility];  Service: General;  Laterality: N/A;    Allergies: Patient has no known allergies.  Medications: Prior to Admission medications   Medication Sig Start Date End Date Taking? Authorizing Provider   acetaminophen (TYLENOL) 325 MG tablet Take 2 tablets (650 mg total) by mouth every 6 (six) hours as needed for mild pain (or temp > 100). Patient not taking: Reported on 06/10/2017 05/11/17   Berna Bue, MD  ALPRAZolam Prudy Feeler) 1 MG tablet Take 1 mg by mouth at bedtime as needed for sleep.  02/11/17   [provider]  CALCIUM PO Take 1,000 mg by mouth daily.    [provider]  ciprofloxacin (CIPRO) 500 MG tablet Take 1 tablet (500 mg total) by mouth 2 (two) times daily for 10 days. 06/12/17 06/22/17  Meuth, Brooke A, PA-C  ciprofloxacin (CIPRO) 500 MG tablet Take 1 tablet (500 mg total) by mouth 2 (two) times daily for 10 days. 06/12/17 06/22/17  Meuth, Brooke A, PA-C  Cyanocobalamin (VITAMIN B 12 PO) Take 1 tablet by mouth daily.    [provider]  docusate sodium (COLACE) 100 MG capsule Take 1 capsule (100 mg total) by mouth 2 (two) times daily. Patient not taking: Reported on 06/10/2017 05/11/17   Berna Bue, MD  losartan-hydrochlorothiazide (HYZAAR) 100-25 MG tablet Take 1 tablet by mouth daily. 03/11/17   [provider]  meloxicam (MOBIC) 15 MG tablet Take 15 mg by mouth daily at 12 noon. 04/21/17   [provider]  metroNIDAZOLE (FLAGYL) 500 MG tablet Take 1 tablet (500 mg total) by mouth 3 (three) times daily for 10 days. 06/12/17 06/22/17  Meuth, Brooke A, PA-C  metroNIDAZOLE (FLAGYL) 500 MG tablet  Take 1 tablet (500 mg total) by mouth 3 (three) times daily for 10 days. 06/12/17 06/22/17  Meuth, Lina Sar, PA-C  omeprazole (PRILOSEC) 40 MG capsule Take 40 mg by mouth daily. 03/28/17   [provider]  ondansetron (ZOFRAN-ODT) 4 MG disintegrating tablet Take 1 tablet (4 mg total) by mouth every 6 (six) hours as needed for nausea or vomiting. 06/12/17   Meuth, Brooke A, PA-C  oxyCODONE (OXY IR/ROXICODONE) 5 MG immediate release tablet Take 1 tablet (5 mg total) by mouth every 6 (six) hours as needed for severe pain. 06/12/17   Meuth, Brooke A, PA-C    Polysaccharide Iron Complex (IFEREX 150 PO) Take 150 mg by mouth daily.    [provider]  sodium chloride flush (NS) 0.9 % SOLN 5 mLs by Intracatheter route 2 (two) times daily for 14 days. 06/12/17 06/26/17  Meuth, Lina Sar, PA-C     No family history on file.  Social History   Socioeconomic History  . Marital status: Married    Spouse name: Not on file  . Number of children: Not on file  . Years of education: Not on file  . Highest education level: Not on file  Occupational History  . Not on file  Social Needs  . Financial resource strain: Not on file  . Food insecurity:    Worry: Not on file    Inability: Not on file  . Transportation needs:    Medical: Not on file    Non-medical: Not on file  Tobacco Use  . Smoking status: Never Smoker  . Smokeless tobacco: Never Used  Substance and Sexual Activity  . Alcohol use: Yes    Comment: social  . Drug use: Never  . Sexual activity: Not on file  Lifestyle  . Physical activity:    Days per week: Not on file    Minutes per session: Not on file  . Stress: Not on file  Relationships  . Social connections:    Talks on phone: Not on file    Gets together: Not on file    Attends religious service: Not on file    Active member of club or organization: Not on file    Attends meetings of clubs or organizations: Not on file    Relationship status: Not on file  Other Topics Concern  . Not on file  Social History Narrative  . Not on file     Vital Signs: There were no vitals taken for this visit.  Physical Exam  Alert, NAD Abdomen: soft, non-tender, midline abdominal drain in place.  Insertion site c/d/i.  Approximately 3-5 mL of purulent-appearing material in bulb.   Imaging: No results found.  Labs:  CBC: Recent Labs    06/09/17 1710 06/10/17 0131 06/10/17 0853 06/11/17 0701  WBC 18.9* 15.0* 14.3* 10.0  HGB 8.0* 7.4* 6.8* 8.0*  HCT 24.9* 22.4* 20.8* 24.8*  PLT 793* 628* 627* 569*     COAGS: Recent Labs    05/06/17 0539 06/10/17 1142  INR 1.04 1.04  APTT 29  --     BMP: Recent Labs    05/29/17 0416 06/09/17 1710 06/10/17 0853 06/11/17 0701 06/12/17 0644  NA 141 134*  --  138 139  K 4.0 3.8  --  3.8 4.1  CL 102 97*  --  104 103  CO2 28 24  --  25 26  GLUCOSE 96 132*  --  107* 116*  BUN 5* 29*  --  9 5*  CALCIUM 8.8* 9.2  --  8.4* 8.5*  CREATININE 0.81 1.22* 0.94 0.64 0.72  GFRNONAA >60 48* >60 >60 >60  GFRAA >60 56* >60 >60 >60    LIVER FUNCTION TESTS: Recent Labs    05/21/17 1432 06/10/17 0131 06/11/17 0701 06/12/17 0644  BILITOT 0.3 0.6 0.6 0.3  AST 23 31 90* 39  ALT 22 21 49 42  ALKPHOS 161* 255* 364* 336*  PROT 7.9 6.6 6.0* 6.1*  ALBUMIN 3.4* 2.1* 1.8* 1.8*    Assessment: Patient with past medical history of intra-abdominal and pelvic abscesses presents to clinic today for close follow-up of her most recent intra-abdominal drain placement on 4/30.  Patient underwent CT Abdomen/Pelvis as well as drain injection today which shows improvement in her abscess but that the collection has not completely resolved. Given her continued cloudy output as well as imaging today, Dr. Deanne Coffer recommends to continue current drain for another 2 weeks. At this time both collections appear to be communicating with the drain as both have improved and decreased in size.  Patient is to keep her appointment with Dr. Sheliah Hatch next week. If no interim improvement, could consider drain repositioning at [facility]. Patient is aware she should continue to flush drain twice daily. She is given a prescription for pre-filled syringes.  She is to continue with routine site care and recording her output log.   Signed: Hoyt Koch, PA 06/18/2017, 1:52 PM   Please refer to Dr. Deanne Coffer attestation of this note for management and plan.

## 2017-07-02 ENCOUNTER — Ambulatory Visit
Admission: RE | Admit: 2017-07-02 | Discharge: 2017-07-02 | Disposition: A | Discharge status: Home / Self Care | Payer: BC Managed Care – PPO | Source: Ambulatory Visit | Attending: General Surgery | Admitting: General Surgery

## 2017-07-02 ENCOUNTER — Encounter: Payer: Self-pay | Admitting: Radiology

## 2017-07-02 ENCOUNTER — Other Ambulatory Visit: Payer: Self-pay | Admitting: General Surgery

## 2017-07-02 DIAGNOSIS — T8143XA Infection following a procedure, organ and space surgical site, initial encounter: Secondary | ICD-10-CM

## 2017-07-02 DIAGNOSIS — T8149XA Infection following a procedure, other surgical site, initial encounter: Principal | ICD-10-CM

## 2017-07-02 DIAGNOSIS — K651 Peritoneal abscess: Secondary | ICD-10-CM

## 2017-07-02 HISTORY — PX: IR RADIOLOGIST EVAL & MGMT: IMG5224

## 2017-07-02 IMAGING — RF DG SINUS / FISTULA TRACT / ABSCESSOGRAM
5 series · 14 of 14 positions shown · non-contrast
Comparison: none

CLINICAL DATA: Postprocedural intraabdominal abscess

EXAM:
ABSCESS DRAIN CATHETER INJECTION UNDER FLUOROSCOPY
TECHNIQUE: The procedure, risks (including but not limited to bleeding,
infection, organ damage ), benefits, and alternatives were explained
to the patient. Questions regarding the procedure were encouraged
and answered. The patient understands and consents to the procedure.

[Series 1: one shot · 1 of 1 slices shown (1 of 3)]
[im 1/1]
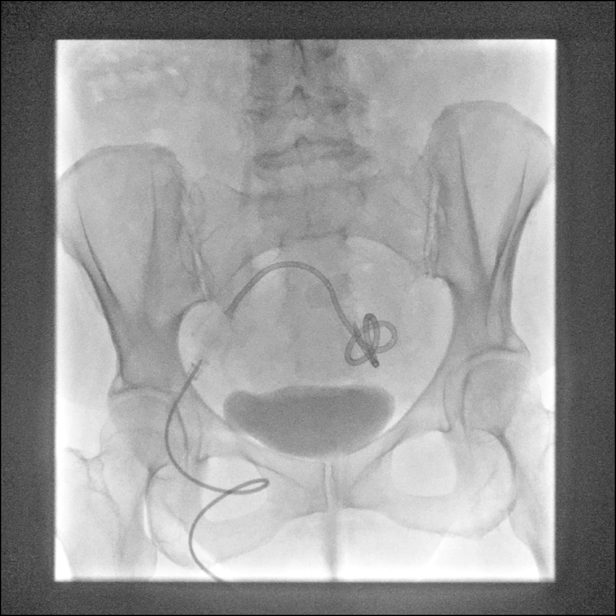

[Series 2: sequence · 4 of 110 frames shown (1 of 2)]
[frame 17/110]
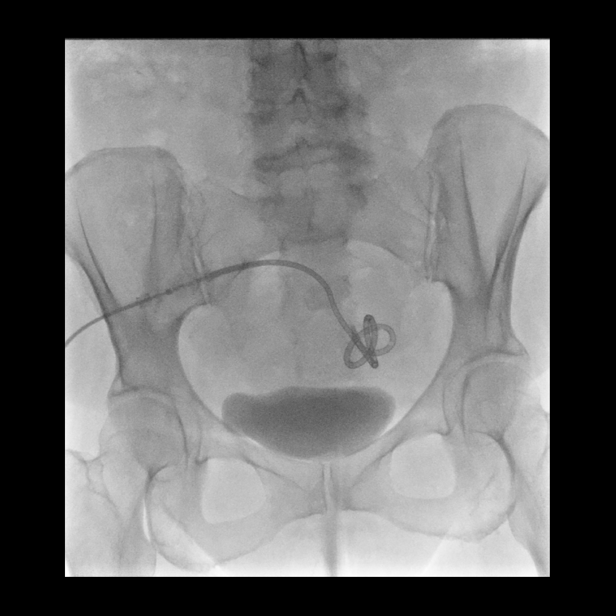
[frame 56/110]
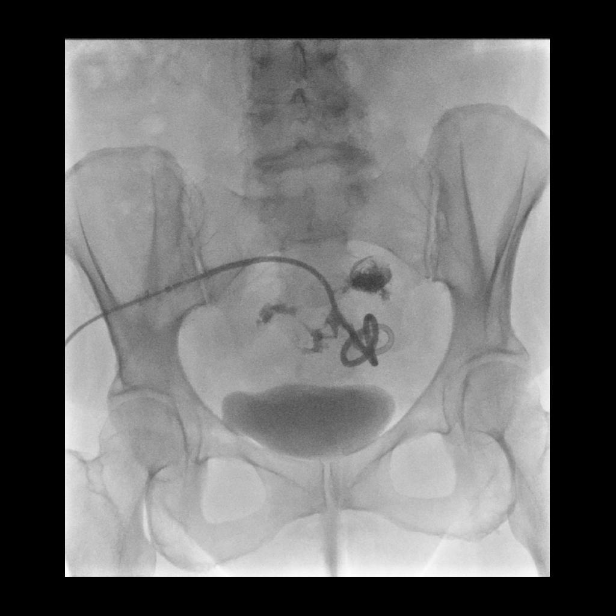
[frame 71/110]
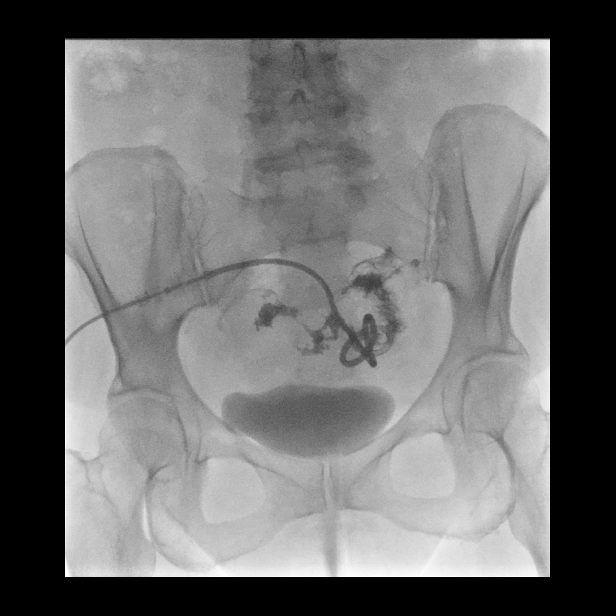
[frame 94/110]
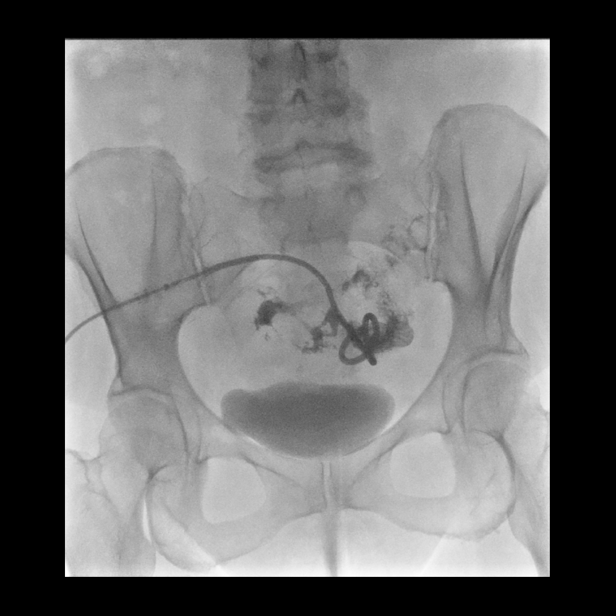

[Series 3: one shot · 2 of 2 slices shown (2 of 3)]
[im 1/2]
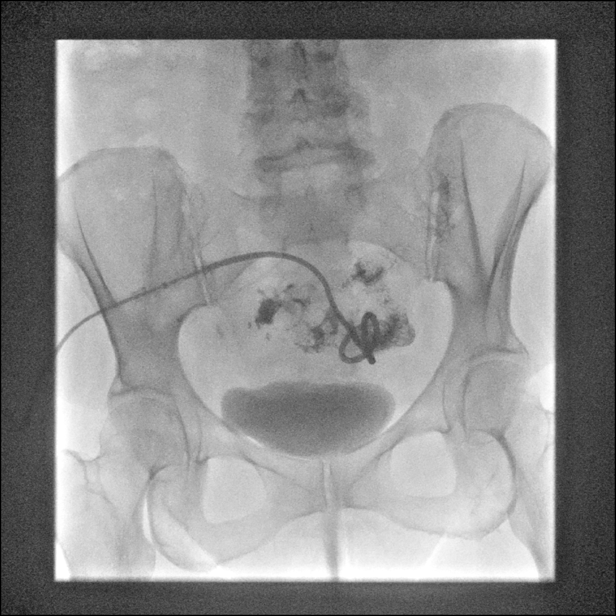
[im 2/2]
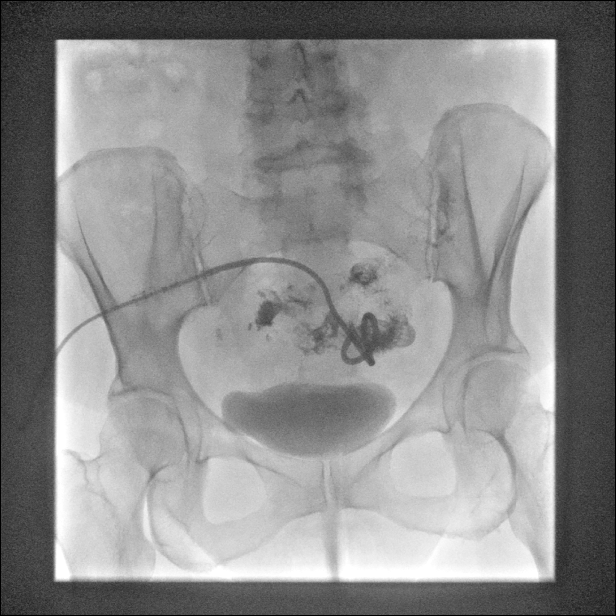

[Series 4: sequence · 4 of 48 frames shown (2 of 2)]
[frame 3/48]
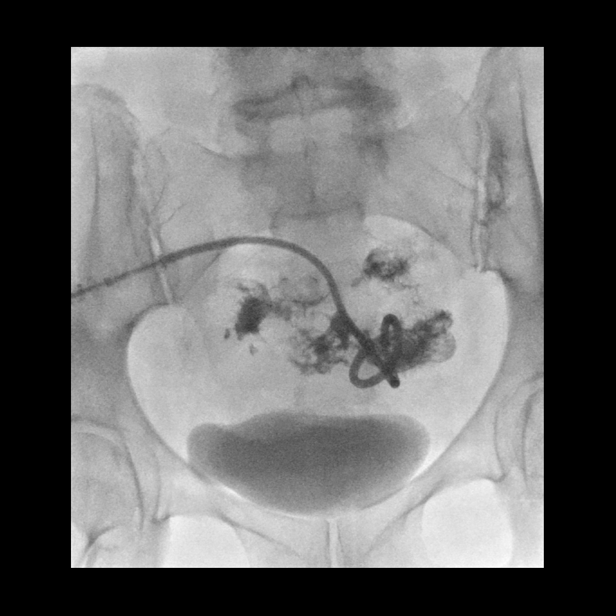
[frame 8/48]
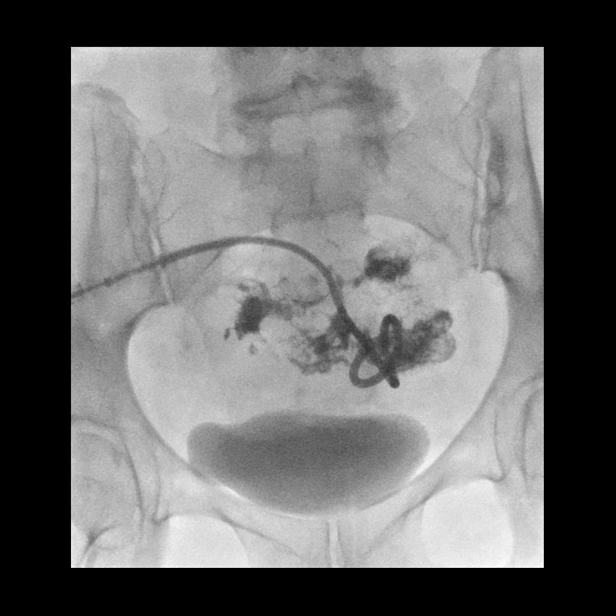
[frame 25/48]
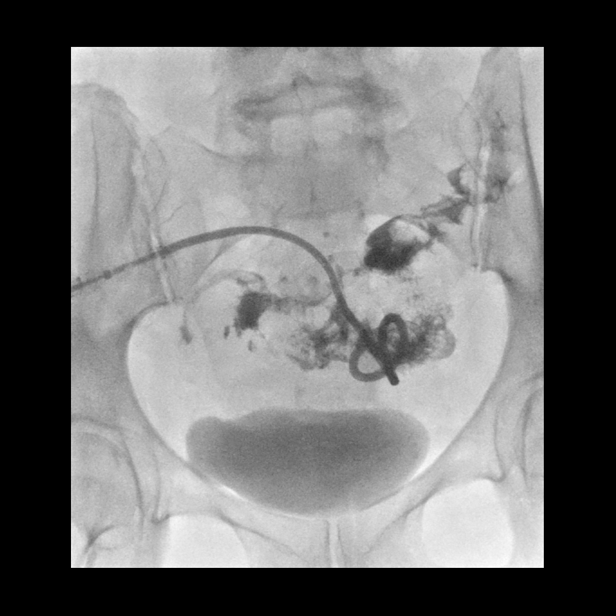
[frame 41/48]
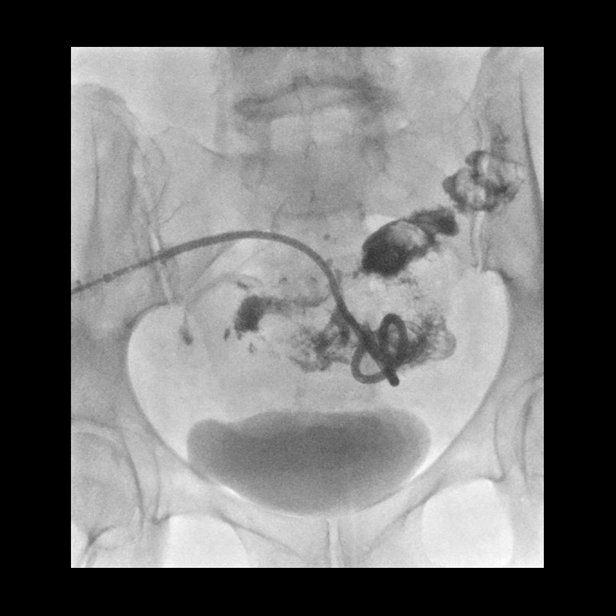

[Series 5: one shot · 3 of 3 slices shown (3 of 3)]
[im 1/3]
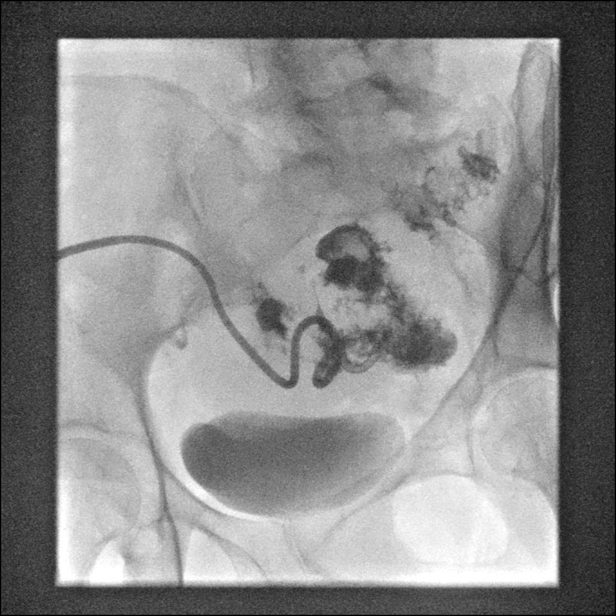
[im 2/3]
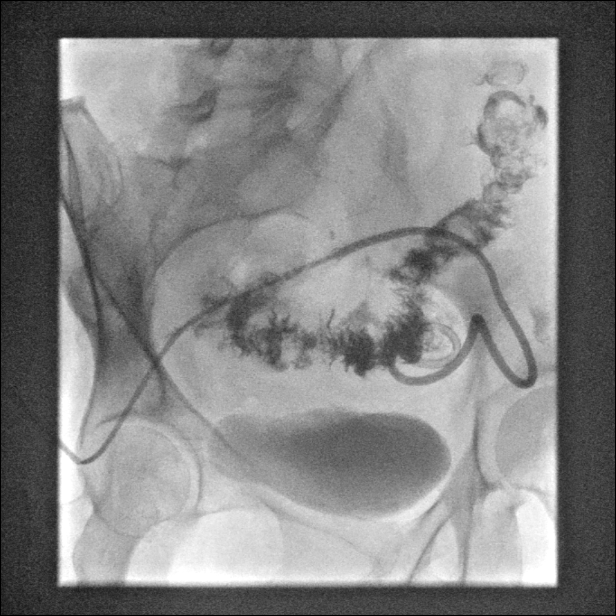
[im 3/3]
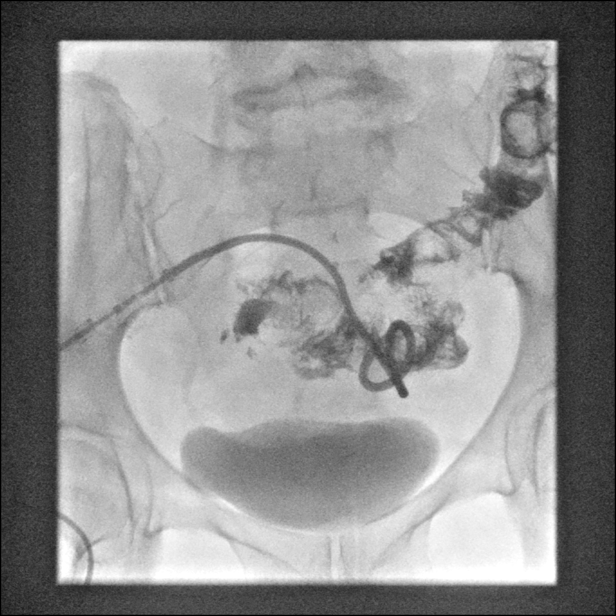

[14 of 14 positions shown; findings below may reference images not displayed]

Survey fluoroscopic inspection reveals stable position of left
pelvic pigtail drain catheter.

Injection demonstrates fistula to the sigmoid colon. As this was a
new and unexpected finding, additional injection was performed
confirming intraluminal passage of the contrast. This was confirmed
on bilateral oblique views. There has been near complete resolution
of the complex low pelvic abscess seen on the previous injection of
06/18/2017..
IMPRESSION: 1. Near complete resolution of the left pelvic abscess, with
documentation of fistula to sigmoid colon.

## 2017-07-02 IMAGING — CT CT ABD-PELV W/ CM
2 of 4 series · 15 of 46 positions shown, 17 images · IV contrast (iopamidol)
Comparison: 06/18/2017

CLINICAL DATA: Perforated sigmoid diverticulitis 05/05/2017.
TECHNIQUE: Multidetector CT imaging of the abdomen and pelvis was performed
using the standard protocol following bolus administration of
intravenous contrast.

CONTRAST:  100mL 2F3YIN-FVV IOPAMIDOL (2F3YIN-FVV) INJECTION 61%

[Series 2: abd pelvis 5.00 br40 s3 ax · axial · 0.72mm/px · z∈[+1320,+1680]mm · 12 of 83 slices shown, 14 images]
[im 7/83  soft-tissue]
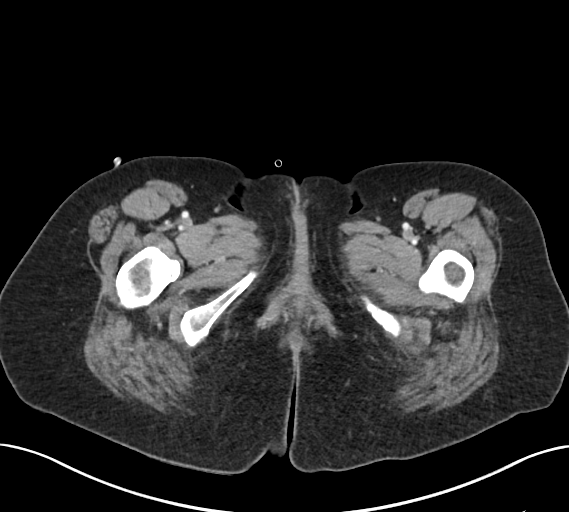
[im 7/83  bone]
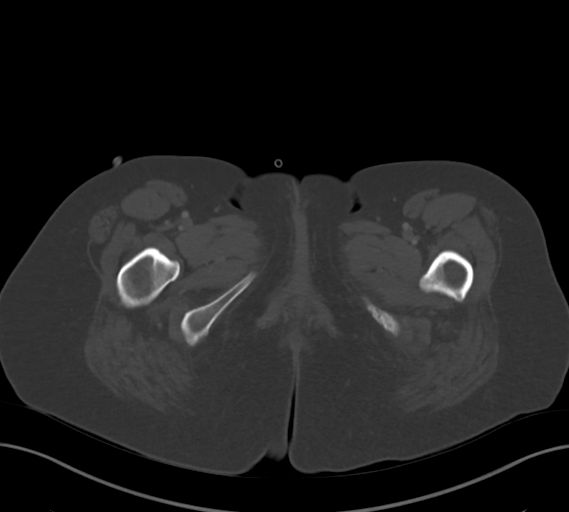
[im 14/83  soft-tissue]
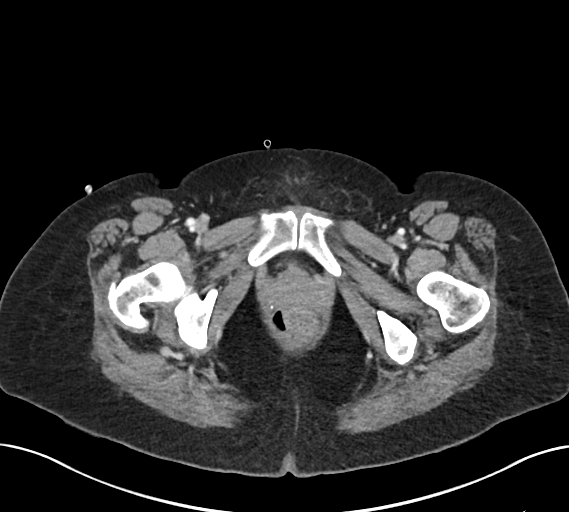
[im 20/83  soft-tissue]
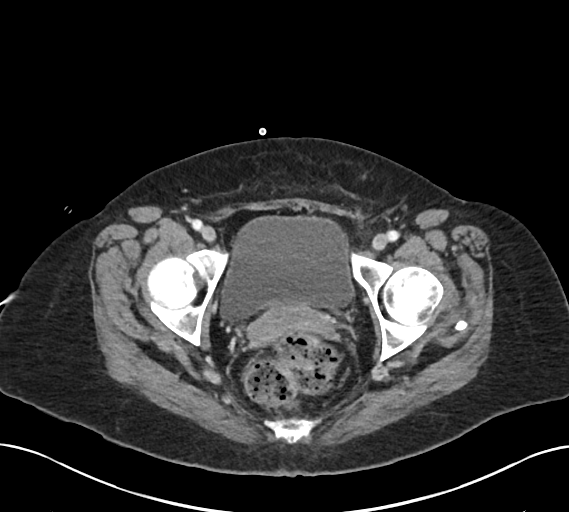
[im 27/83  soft-tissue]
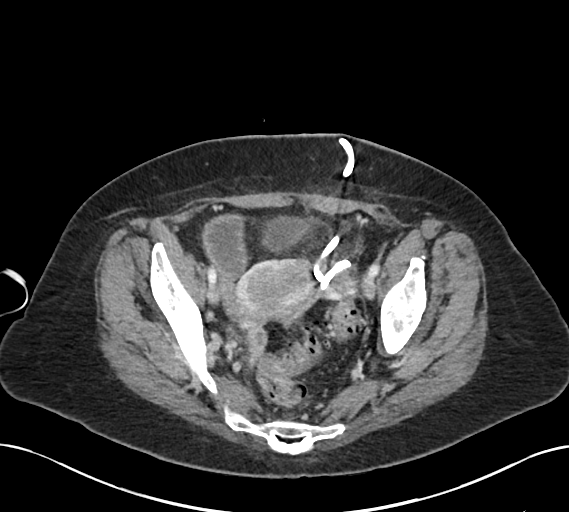
[im 33/83  soft-tissue]
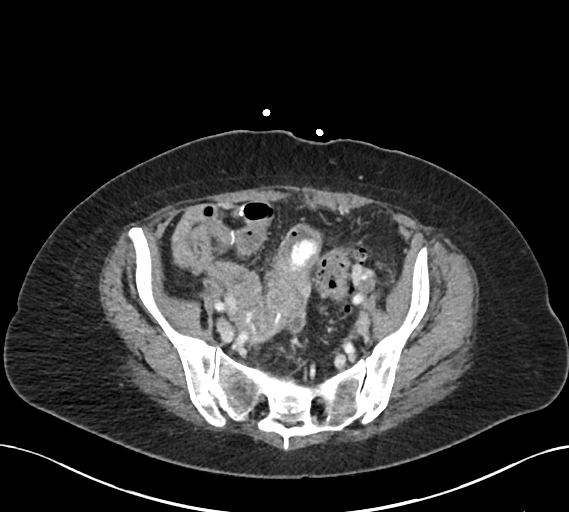
[im 40/83  soft-tissue]
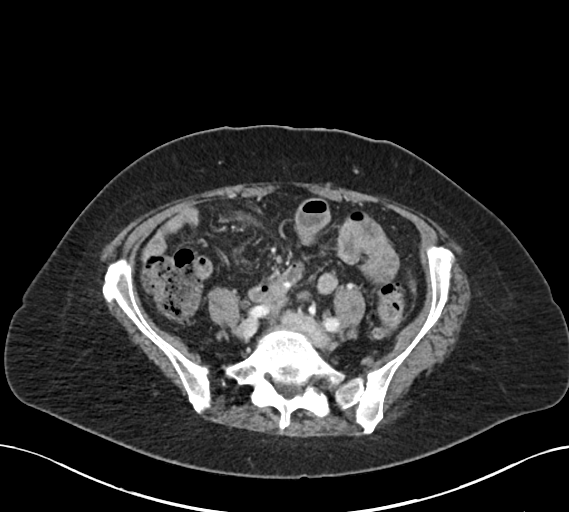
[im 46/83  soft-tissue]
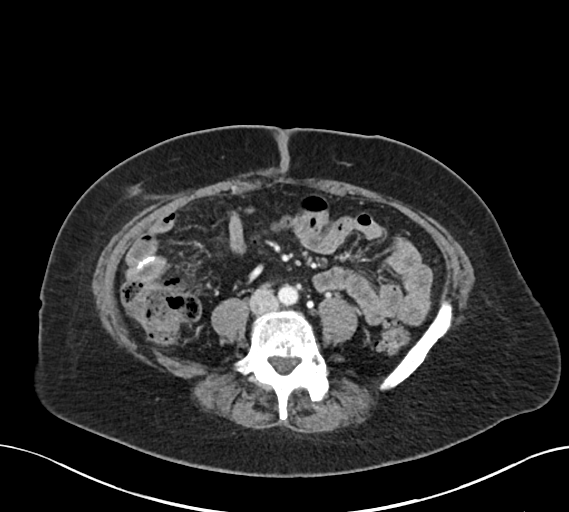
[im 53/83  soft-tissue]
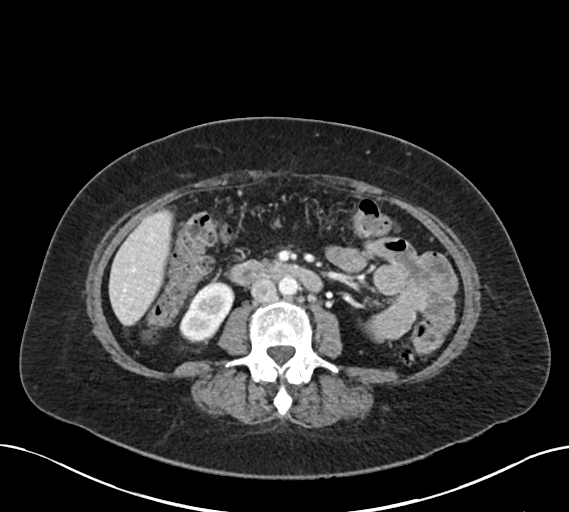
[im 60/83  soft-tissue]
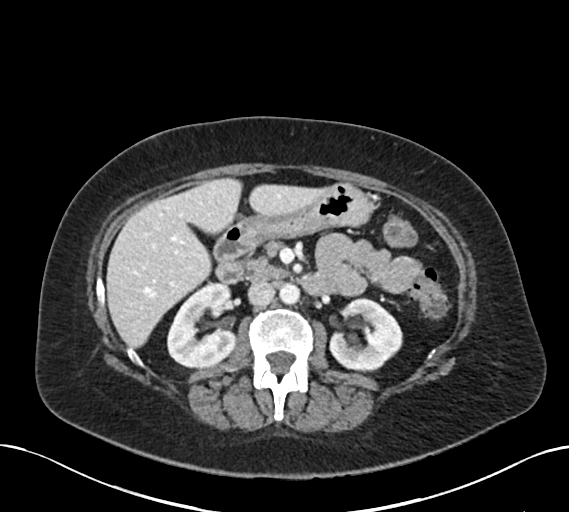
[im 60/83  bone]
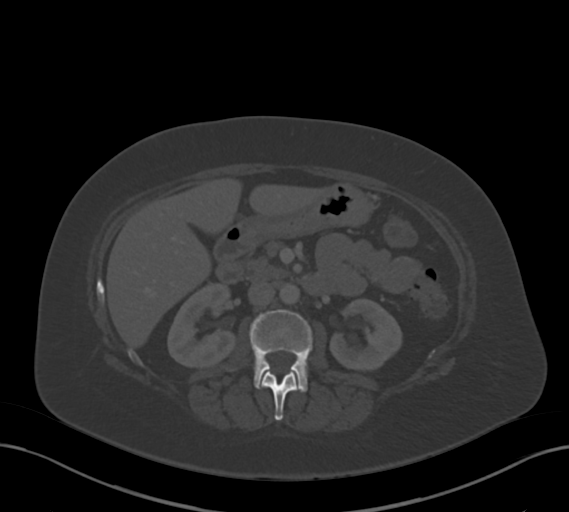
[im 66/83  soft-tissue]
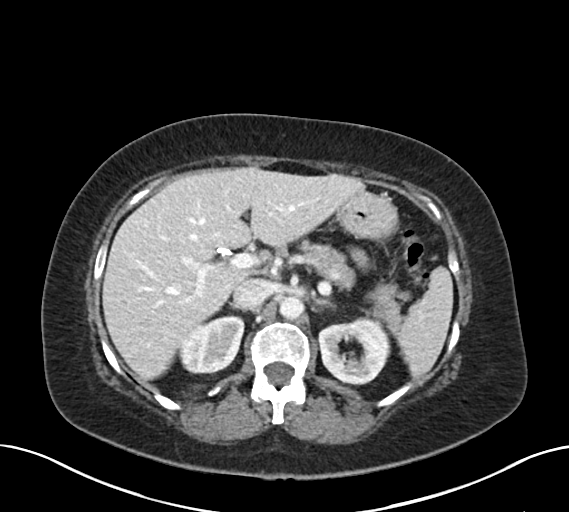
[im 73/83  soft-tissue]
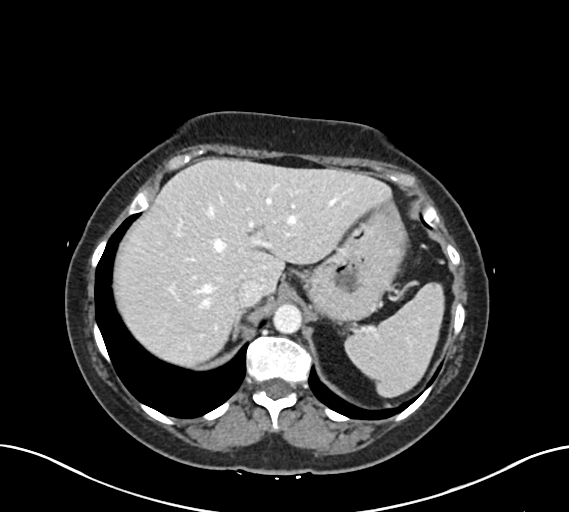
[im 79/83  soft-tissue]
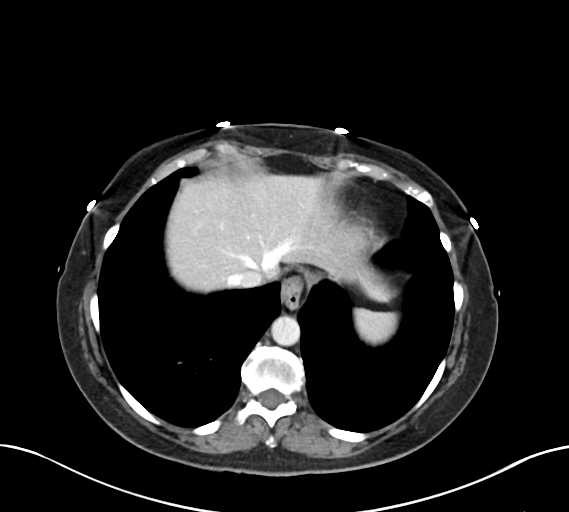

[Series 6: abd pelvis 2.00 br40 s3 cor · coronal · 0.80mm/px · 3 of 132 slices shown]
[im 44/132  soft-tissue]
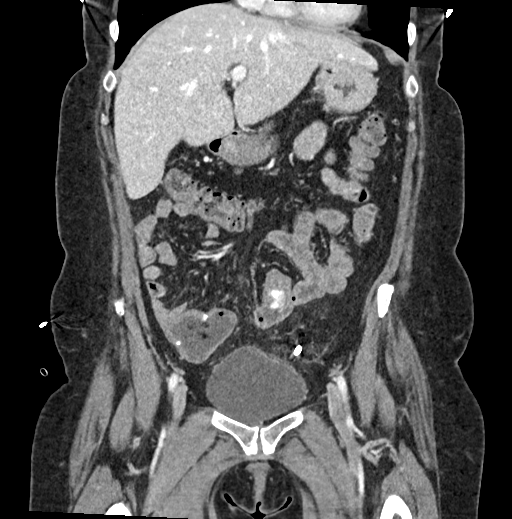
[im 59/132  soft-tissue]
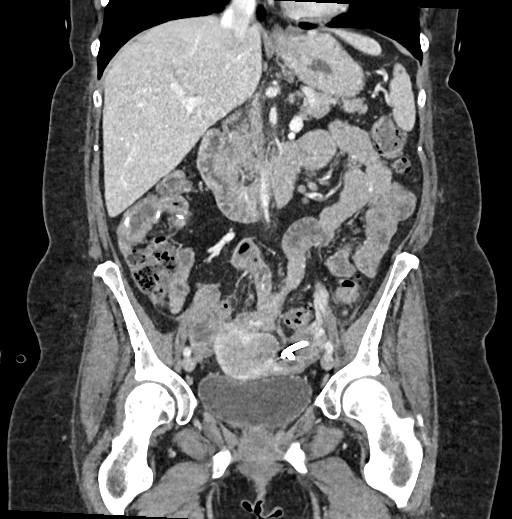
[im 73/132  soft-tissue]
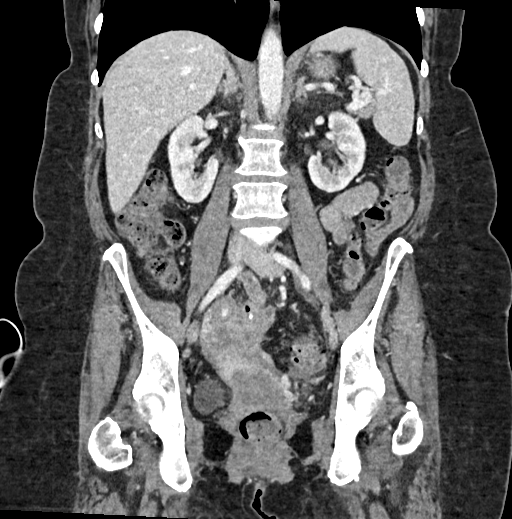

[15 of 46 positions shown; findings below may reference images not displayed]

Abscess drain catheter placement 05/06/2017, removed 05/20/2017

Lysis of adhesions with enterotomy repairs 05/22/2017

CT abscess drain catheter placement 06/10/2017

Partial abscess resolution on follow-up scan 06/18/2017, drain left
in place

She has had about 5 mL output daily. Twice daily 5 mL saline
flushes.

EXAM:
CT ABDOMEN AND PELVIS WITH CONTRAST
FINDINGS: Lower chest: No acute abnormality.

Hepatobiliary: No focal liver abnormality is seen. Status post
cholecystectomy. No biliary dilatation.

Pancreas: Unremarkable. No pancreatic ductal dilatation or
surrounding inflammatory changes.

Spleen: Normal in size without focal abnormality.

Adrenals/Urinary Tract: Adrenal glands are unremarkable. Kidneys are
normal, without renal calculi, focal lesion, or hydronephrosis.
Bladder is unremarkable.

Stomach/Bowel: Stomach is nondistended. Small bowel decompressed.
Anastomotic staple lines in the right mid abdomen and right lower
quadrant. Appendix not identified. The colon is nondilated.
Scattered descending and sigmoid diverticula without adjacent
inflammatory/edematous change or abscess. Significant improvement of
the linear gas and fluid collection extending from the left pelvic
drain catheter across midline posterior to the uterus, the largest
low-attenuation component measuring less than 1 cm.

Vascular/Lymphatic: Portal vein patent. Aortic Atherosclerosis
(HKTQF-170.0) without aneurysm or stenosis. No abdominal or pelvic
adenopathy. Bilateral pelvic phleboliths.

Reproductive: Lobular inhomogeneous uterus suggesting multiple
fibroids. No adnexal mass.

Other: Stable position of left anterior pelvic percutaneous drain
catheter. No ascites. No free air.

Musculoskeletal: Facet DJD L3-4 with large left synovial cyst
extending into the spinal canal.. Mild grade 1 anterolisthesis L3-4
presumably secondary to facet disease as there is no pars defect or
fracture. Negative for fracture or worrisome bone lesion.
IMPRESSION: 1. Stable left pelvic drain catheter with partial resolution of
loculated deep pelvic gas/fluid collection since prior study.
2. No new or undrained abdominal or pelvic collections.
3. Descending and sigmoid diverticulosis.

## 2017-07-02 MED ORDER — IOPAMIDOL (ISOVUE-300) INJECTION 61%
100.0000 mL | Freq: Once | INTRAVENOUS | Status: AC | PRN
  Administered 2017-07-02: 100 mL via INTRAVENOUS

## 2017-07-02 NOTE — Progress Notes (Signed)
Patient ID: Kim Snow, female   DOB: 03-13-1960, 57 y.o.   MRN: [id number]       Chief Complaint: Patient was seen in consultation today for follow-up pelvic abscess drain catheter at the request of Rodman Pickle  Referring Physician(s): Rodman Pickle  History of Present Illness: Kim Snow is a 57 y.o. female  who presents for scheduled appointment 1 month post who returns for scheduled follow-up and catheter check.  She was last seen 06/18/2017.  She has done well over the past couple of weeks.  About 5 mL output from the left pelvic drain catheter per day.  Flushing 5 mL twice daily.  No leaking at the skin entry site.  Past Medical History:  Diagnosis Date  . Anemia   . Anxiety   . Asthma   . Depression   . GERD (gastroesophageal reflux disease)   . Hypertension   . Insomnia   . Migraine     Past Surgical History:  Procedure Laterality Date  . CESAREAN SECTION    . CHOLECYSTECTOMY    . IR RADIOLOGIST EVAL & MGMT  05/20/2017  . IR RADIOLOGIST EVAL & MGMT  06/18/2017  . IR RADIOLOGIST EVAL & MGMT  07/02/2017  . KNEE ARTHROSCOPY    . LAPAROSCOPY N/A 05/22/2017   Procedure: LAPAROSCOPY DIAGNOSTIC, LYSIS OF ADHESIONS, DRAINAGE OF ABCESS, RIGID PROCTOSCOPY, SMALL BOWEL RESECTION X2;  Surgeon: Kinsinger, De Blanch, MD;  Location: [facility];  Service: General;  Laterality: N/A;    Allergies: Patient has no known allergies.  Medications: Prior to Admission medications   Medication Sig Start Date End Date Taking? Authorizing Provider  acetaminophen (TYLENOL) 325 MG tablet Take 2 tablets (650 mg total) by mouth every 6 (six) hours as needed for mild pain (or temp > 100). Patient not taking: Reported on 06/10/2017 05/11/17   Berna Bue, MD  ALPRAZolam Prudy Feeler) 1 MG tablet Take 1 mg by mouth at bedtime as needed for sleep.  02/11/17   [provider]  CALCIUM PO Take 1,000 mg by mouth daily.    [provider]  Cyanocobalamin (VITAMIN  B 12 PO) Take 1 tablet by mouth daily.    [provider]  docusate sodium (COLACE) 100 MG capsule Take 1 capsule (100 mg total) by mouth 2 (two) times daily. Patient not taking: Reported on 06/10/2017 05/11/17   Berna Bue, MD  losartan-hydrochlorothiazide (HYZAAR) 100-25 MG tablet Take 1 tablet by mouth daily. 03/11/17   [provider]  meloxicam (MOBIC) 15 MG tablet Take 15 mg by mouth daily at 12 noon. 04/21/17   [provider]  omeprazole (PRILOSEC) 40 MG capsule Take 40 mg by mouth daily. 03/28/17   [provider]  ondansetron (ZOFRAN-ODT) 4 MG disintegrating tablet Take 1 tablet (4 mg total) by mouth every 6 (six) hours as needed for nausea or vomiting. 06/12/17   Meuth, Brooke A, PA-C  oxyCODONE (OXY IR/ROXICODONE) 5 MG immediate release tablet Take 1 tablet (5 mg total) by mouth every 6 (six) hours as needed for severe pain. 06/12/17   Meuth, Brooke A, PA-C  Polysaccharide Iron Complex (IFEREX 150 PO) Take 150 mg by mouth daily.    [provider]     No family history on file.  Social History   Socioeconomic History  . Marital status: Married    Spouse name: Not on file  . Number of children: Not on file  . Years of education: Not on file  . Highest  education level: Not on file  Occupational History  . Not on file  Social Needs  . Financial resource strain: Not on file  . Food insecurity:    Worry: Not on file    Inability: Not on file  . Transportation needs:    Medical: Not on file    Non-medical: Not on file  Tobacco Use  . Smoking status: Never Smoker  . Smokeless tobacco: Never Used  Substance and Sexual Activity  . Alcohol use: Yes    Comment: social  . Drug use: Never  . Sexual activity: Not on file  Lifestyle  . Physical activity:    Days per week: Not on file    Minutes per session: Not on file  . Stress: Not on file  Relationships  . Social connections:    Talks on phone: Not on file    Gets together: Not  on file    Attends religious service: Not on file    Active member of club or organization: Not on file    Attends meetings of clubs or organizations: Not on file    Relationship status: Not on file  Other Topics Concern  . Not on file  Social History Narrative  . Not on file    ECOG Status: 1 - Symptomatic but completely ambulatory  Review of Systems: A 12 point ROS discussed and pertinent positives are indicated in the HPI above.  All other systems are negative.  Review of Systems  Vital Signs: BP 139/76   Pulse 90   Temp 98 F (36.7 C)   SpO2 100%   Physical Exam Constitutional: Oriented to person, place, and time. Well-developed and well-nourished. No distress.  HENT:  Head: Normocephalic and atraumatic.  Eyes: Conjunctivae and EOM are normal. Right eye exhibits no discharge. Left eye exhibits no discharge. No scleral icterus.  Neck: No JVD present.  Pulmonary/Chest: Effort normal. No stridor. No respiratory distress.  Abdomen: soft, non distended.  Left lower quadrant drain catheter site clean dry and intact without skin breakdown or leakage. Neurological:  alert and oriented to person, place, and time.  Skin: Skin is warm and dry.  not diaphoretic.  Psychiatric:   normal mood and affect.   behavior is normal. Judgment and thought content normal.   Mallampati Score:     Imaging: Ct Abdomen Pelvis W Contrast  Result Date: 07/02/2017 CLINICAL DATA:  Perforated sigmoid diverticulitis 05/05/2017. Abscess drain catheter placement 05/06/2017, removed 05/20/2017 Lysis of adhesions with enterotomy repairs 05/22/2017 CT abscess drain catheter placement 06/10/2017 Partial abscess resolution on follow-up scan 06/18/2017, drain left in place She has had about 5 mL output daily. Twice daily 5 mL saline flushes. EXAM: CT ABDOMEN AND PELVIS WITH CONTRAST TECHNIQUE: Multidetector CT imaging of the abdomen and pelvis was performed using the standard protocol following bolus  administration of intravenous contrast. CONTRAST:  ISOVUE-300 IOPAMIDOL (ISOVUE-300) INJECTION 61% COMPARISON:  06/18/2017 FINDINGS: Lower chest: No acute abnormality. Hepatobiliary: No focal liver abnormality is seen. Status post cholecystectomy. No biliary dilatation. Pancreas: Unremarkable. No pancreatic ductal dilatation or surrounding inflammatory changes. Spleen: Normal in size without focal abnormality. Adrenals/Urinary Tract: Adrenal glands are unremarkable. Kidneys are normal, without renal calculi, focal lesion, or hydronephrosis. Bladder is unremarkable. Stomach/Bowel: Stomach is nondistended. Small bowel decompressed. Anastomotic staple lines in the right mid abdomen and right lower quadrant. Appendix not identified. The colon is nondilated. Scattered descending and sigmoid diverticula without adjacent inflammatory/edematous change or abscess. Significant improvement of the linear gas and fluid  collection extending from the left pelvic drain catheter across midline posterior to the uterus, the largest low-attenuation component measuring less than 1 cm. Vascular/Lymphatic: Portal vein patent. Aortic Atherosclerosis (ICD10-170.0) without aneurysm or stenosis. No abdominal or pelvic adenopathy. Bilateral pelvic phleboliths. Reproductive: Lobular inhomogeneous uterus suggesting multiple fibroids. No adnexal mass. Other: Stable position of left anterior pelvic percutaneous drain catheter. No ascites. No free air. 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 Mild grade 1 anterolisthesis L3-4 presumably secondary to facet disease as there is no pars defect or fracture. Negative for fracture or worrisome bone lesion. IMPRESSION: 1. Stable left pelvic drain catheter with partial resolution of loculated deep pelvic gas/fluid collection since prior study. 2. No new or undrained abdominal or pelvic collections. 3. Descending and sigmoid diverticulosis.  Electronically Signed   By: Corlis Leak M.D.   On: 07/02/2017 09:48   Ct Abdomen Pelvis W Contrast  Result Date: 06/18/2017 CLINICAL DATA:  diverticular abscess, status post percutaneous drain catheter placement. Decreasing output. EXAM: CT ABDOMEN AND PELVIS WITH CONTRAST TECHNIQUE: Multidetector CT imaging of the abdomen and pelvis was performed using the standard protocol following bolus administration of intravenous contrast. CONTRAST:  ISOVUE-300 IOPAMIDOL (ISOVUE-300) INJECTION 61% COMPARISON:  06/10/2017 and previous FINDINGS: Lower chest: No acute abnormality. Hepatobiliary: No focal liver abnormality is seen. Status post cholecystectomy. No biliary dilatation. Pancreas: Unremarkable. No pancreatic ductal dilatation or surrounding inflammatory changes. Spleen: Normal in size without focal abnormality. Adrenals/Urinary Tract: No adrenal hemorrhage or renal injury identified. Bladder is unremarkable. Stomach/Bowel: Stomach and small bowel nondistended. Anastomotic staple lines in the region of the hepatic flexure and right lower quadrant. Colon nondilated. Scattered descending and sigmoid diverticula. Vascular/Lymphatic: Minimal Aortic Atherosclerosis (ICD10-170.0). No aneurysm. No abdominal or pelvic adenopathy localized. Reproductive: Uterus and bilateral adnexa are unremarkable. Other: No ascites.  No free air. Percutaneous drain catheter into the left pelvis. Partial interval decompression of the elongated complex gas and fluid collection seen previously, extending into the posterior right pelvis. It is difficult to differentiate decompressed adjacent small bowel from extraluminal collections in the right lower quadrant and right pelvis. No new undrained collections. Musculoskeletal: Facet DJD L3-4 and L4-5 with probable synovial cyst extending into the spinal canal on the left L3-4. Mild grade 1 anterolisthesis L3-4 probably secondary to facet disease. Negative for fracture or worrisome bone  lesion. IMPRESSION: 1. Interval partial decompression of complex pelvic fluid collection post percutaneous drain catheter placement. I would recommend the use of oral contrast as well as IV contrast on follow-up scans to help differentiate small-bowel from extraluminal collections. 2. No new undrained collections. 3. Descending and sigmoid diverticulosis. Electronically Signed   By: Corlis Leak M.D.   On: 06/18/2017 16:41   Ct Abdomen Pelvis W Contrast  Result Date: 06/10/2017 CLINICAL DATA:  Dizziness and hypotension since removal of drain 3 days ago. White cell count 18.9. EXAM: CT ABDOMEN AND PELVIS WITH CONTRAST TECHNIQUE: Multidetector CT imaging of the abdomen and pelvis was performed using the standard protocol following bolus administration of intravenous contrast. CONTRAST:  80mL ISOVUE-300 IOPAMIDOL (ISOVUE-300) INJECTION 61% COMPARISON:  05/20/2017 FINDINGS: Lower chest: Lung bases are clear. Hepatobiliary: No focal liver abnormality is seen. Status post cholecystectomy. No biliary dilatation. Pancreas: Unremarkable. No pancreatic ductal dilatation or surrounding inflammatory changes. Spleen: Normal in size without focal abnormality. Adrenals/Urinary Tract: Adrenal glands are unremarkable. Kidneys are normal, without renal calculi, focal lesion, or hydronephrosis. Bladder is unremarkable. Stomach/Bowel: Stomach, small bowel, and colon are  not abnormally distended. Surgical anastomoses demonstrated in the right: And right lower quadrant bowel. Diverticulosis of the colon. Since the previous study a trans sciatic drainage catheter has been removed. There appears to be recurrent loculated fluid, likely abscess arising in the previous catheter site and along the low pelvis. Fluid collection measures about 3.7 x 11.7 cm. There is a new sinus tract versus ileostomy in the right lower quadrant. This is decompressed. Subcutaneous emphysema adjacent to the ileostomy. Vascular/Lymphatic: No significant vascular  findings are present. No enlarged abdominal or pelvic lymph nodes. Reproductive: Uterus and bilateral adnexa are unremarkable. Other: No abdominal wall hernia or abnormality. No abdominopelvic ascites. Musculoskeletal: No acute or significant osseous findings. IMPRESSION: 1. Recurrent abscess in the right low pelvis post catheter removal. 2. New since the previous study, there is a sinus tract versus ileostomy extending to the right lower quadrant with adjacent subcutaneous gas. Correlation with surgical history recommended. 3. Diverticulosis of the sigmoid colon.  No bowel obstruction. Electronically Signed   By: Burman Nieves M.D.   On: 06/10/2017 02:28   Dg Sinus/fist Tube Chk-non Gi  Result Date: 07/02/2017 CLINICAL DATA:  Postprocedural intraabdominal abscess EXAM: ABSCESS DRAIN CATHETER INJECTION UNDER FLUOROSCOPY TECHNIQUE: The procedure, risks (including but not limited to bleeding, infection, organ damage ), benefits, and alternatives were explained to the patient. Questions regarding the procedure were encouraged and answered. The patient understands and consents to the procedure. Survey fluoroscopic inspection reveals stable position of left pelvic pigtail drain catheter. 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 As this was a new and unexpected finding, additional injection was performed confirming intraluminal passage of the contrast. This was confirmed on bilateral oblique views. There has been near complete resolution of the complex low pelvic abscess seen on the previous injection of 06/18/2017. IMPRESSION: 1. 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 Electronically Signed   By: Corlis Leak M.D.   On: 07/02/2017 09:51   Dg Sinus/fist Tube Chk-non Gi  Result Date: 06/18/2017 CLINICAL DATA:  Postprocedural intraabdominal abscess PROCEDURE: ABSCESS DRAIN CATHETER INJECTION UNDER FLUOROSCOPY TECHNIQUE: The procedure, risks  (including but not limited to bleeding, infection, organ damage ), benefits, and alternatives were explained to the patient. Questions regarding the procedure were encouraged and answered. The patient understands and consents to the procedure. Survey fluoroscopic inspection reveals stable position of pigtail drain catheter in the left pelvis. Injection demonstrates filling of a complex extraluminal fluid collection, bilobed, with a thin tract extending across midline to the right-sided component. No definite intraluminal opacification. FLUOROSCOPY TIME:  24 seconds; 16 mGy IMPRESSION: 1. Persistent complex pelvic collection post percutaneous drain catheter placement. No definite fistula. Catheter will remain to external drainage with follow-up scheduled in 2 weeks. Electronically Signed   By: Corlis Leak M.D.   On: 06/18/2017 16:45   Ct Image Guided Fluid Drain By Catheter  Result Date: 06/10/2017 INDICATION: 57 year old with history a partial small-bowel resection and lysis of adhesions related to a small bowel perforation. Patient has developed purulent output from an old surgical drain site. Recent CT imaging demonstrates a complex abscess in the lower abdomen and pelvis. EXAM: CT-GUIDED DRAIN PLACEMENT WITHIN THE ABDOMINAL/PELVIC ABSCESS COLLECTION MEDICATIONS: The patient is currently admitted to the hospital and receiving intravenous antibiotics. The antibiotics were administered within an appropriate time frame prior to the initiation of the procedure. ANESTHESIA/SEDATION: Fentanyl 100 mcg IV; Versed 4.0 mg IV Moderate Sedation Time:  30 minutes The patient was continuously  monitored during the procedure by the interventional radiology nurse under my direct supervision. COMPLICATIONS: None immediate. PROCEDURE: Informed written consent was obtained from the patient after a thorough discussion of the procedural risks, benefits and alternatives. All questions were addressed. A timeout was performed prior to  the initiation of the procedure. Patient was placed supine on the CT scanner. Images through the lower abdomen and pelvis were obtained. Gas containing collection in the lower abdomen and pelvis was identified. The lower abdomen was prepped with chlorhexidine and sterile field was created. Skin and soft tissues were anesthetized with 1% lidocaine. An 18 gauge coaxial needle was directed into this complex air-fluid collection with CT guidance. Gas and purulent yellow fluid was aspirated. Stiff Amplatz wire was advanced into the collection. Tract was dilated to accommodate a 10 [place] drain. Approximately 15 mL of yellow purulent fluid was aspirated along with gas. Drain was secured to the skin with a suture and adhesive device. Drain was attached to a suction bulb. Fluid sample was sent for culture. FINDINGS: Complex air-fluid collection in the lower abdomen and pelvis adjacent to the uterus and left adnexa. The wire and drain coiled into a small cavity within the central abdomen. Wire and catheter did not cross the midline into the more posterior aspect of the collection. IMPRESSION: CT-guided placement of a drainage catheter within the abscess in the lower abdomen/pelvis. Combination of purulent yellow fluid and gas was removed from this collection. There is concern that the drain collection may not communicate with the collection along the right pelvis. Recommend close follow-up imaging to ensure adequate drainage from this catheter. Electronically Signed   By: Richarda Overlie M.D.   On: 06/10/2017 17:06   Ir Radiologist Eval & Mgmt  Result Date: 07/02/2017 Please refer to notes tab for details about interventional procedure. (Op Note)  Ir Radiologist Eval & Mgmt  Result Date: 06/18/2017 Please refer to notes tab for details about interventional procedure. (Op Note)   Labs:  CBC: Recent Labs    06/09/17 1710 06/10/17 0131 06/10/17 0853 06/11/17 0701  WBC 18.9* 15.0* 14.3* 10.0  HGB 8.0* 7.4* 6.8*  8.0*  HCT 24.9* 22.4* 20.8* 24.8*  PLT 793* 628* 627* 569*    COAGS: Recent Labs    05/06/17 0539 06/10/17 1142  INR 1.04 1.04  APTT 29  --     BMP: Recent Labs    05/29/17 0416 06/09/17 1710 06/10/17 0853 06/11/17 0701 06/12/17 0644  NA 141 134*  --  138 139  K 4.0 3.8  --  3.8 4.1  CL 102 97*  --  104 103  CO2 28 24  --  25 26  GLUCOSE 96 132*  --  107* 116*  BUN 5* 29*  --  9 5*  CALCIUM 8.8* 9.2  --  8.4* 8.5*  CREATININE 0.81 1.22* 0.94 0.64 0.72  GFRNONAA >60 48* >60 >60 >60  GFRAA >60 56* >60 >60 >60    LIVER FUNCTION TESTS: Recent Labs    05/21/17 1432 06/10/17 0131 06/11/17 0701 06/12/17 0644  BILITOT 0.3 0.6 0.6 0.3  AST 23 31 90* 39  ALT 22 21 49 42  ALKPHOS 161* 255* 364* 336*  PROT 7.9 6.6 6.0* 6.1*  ALBUMIN 3.4* 2.1* 1.8* 1.8*    TUMOR MARKERS: No results for input(s): AFPTM, CEA, CA199, CHROMGRNA in the last 8760 hours.  Assessment and Plan:  My impression is that she continues to make progress with healing of her diverticular abscess.  There  is very little abscess cavity left on CT, and nonopacified with catheter injection.  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  I reviewed the findings with the patient.  We have reviewed the pathophysiology of fistula, and treatment options.  She seemed to understand, and asked questions which were answered.  I changed her drain collection from suction bulb to gravity bag to minimize any pressure gradient across the fistula.  She will continue her twice daily 5 mL saline flushes.  We will see her back in about 2 weeks for injection.  Given the lack of any significant abscess cavity, I do not think we will need to continue to obtain follow-up CTs, unless she has new abdominal or pelvic symptoms.  She already has a scheduled clinic follow-up with general surgery in about 4 to 5 weeks.  Thank you for this interesting  consult.  I greatly enjoyed meeting Kim Snow and look forward to participating in their care.  A copy of this report was sent to the requesting provider on this date.  Electronically Signed: Durwin Glaze 07/02/2017, 10:21 AM   I spent a total of    15 Minutes in face to face in clinical consultation, greater than 50% of which was counseling/coordinating care for diverticular abscess with colonic fistula.

## 2017-07-05 ENCOUNTER — Telehealth: Payer: Self-pay | Admitting: Surgery

## 2017-07-05 NOTE — Telephone Encounter (Signed)
Ms. Intrieri had a small bowel resection  X 2 and enterolysis by Dr. Sheliah Hatch on 05/22/2017. She had a drain in her pelvis with a colonic fistula.  She had a CT scan/filstulagram on 07/02/2017 that shows a persistent colonic fistula.  She's had some lower abdominal discomfort since the x-rays on Wednesday, 5/22.  She spoke to someone in our office who started her on Augmentin on 07/03/2017.  She is still having some lower abdominal pain.  I answered questions about her symptoms.  Her son has an Metallurgist she does not want to miss.  If she is still having symptoms, she will contact our office on Tuesday.  She knows that if she gets worse, she'll need to come to the ER.  Ovidio Kin, MD, [facility] Pager: [phone] Office phone:  [phone]

## 2017-07-16 ENCOUNTER — Ambulatory Visit
Admission: RE | Admit: 2017-07-16 | Discharge: 2017-07-16 | Disposition: A | Discharge status: Home / Self Care | Payer: BC Managed Care – PPO | Source: Ambulatory Visit | Attending: General Surgery | Admitting: General Surgery

## 2017-07-16 ENCOUNTER — Other Ambulatory Visit: Payer: Self-pay | Admitting: General Surgery

## 2017-07-16 ENCOUNTER — Encounter: Payer: Self-pay | Admitting: Radiology

## 2017-07-16 DIAGNOSIS — T8143XA Infection following a procedure, organ and space surgical site, initial encounter: Secondary | ICD-10-CM

## 2017-07-16 DIAGNOSIS — T8149XA Infection following a procedure, other surgical site, initial encounter: Principal | ICD-10-CM

## 2017-07-16 HISTORY — PX: IR RADIOLOGIST EVAL & MGMT: IMG5224

## 2017-07-16 IMAGING — RF DG SINUS / FISTULA TRACT / ABSCESSOGRAM
1 series · 8 of 8 positions shown · non-contrast
Comparison: Drainage catheter injection - 07/02/2017;

CLINICAL DATA: History of partial small bowel resection and lysis
of adhesions related to small bowel perforation post CT-guided
percutaneous drainage catheter placement for postoperative
intra-abdominal abscess on 06/10/2017.
TECHNIQUE: The patient was positioned supine on the fluoroscopy table.

[Series 1: one shot · 8 of 8 slices shown]
[im 1/8]
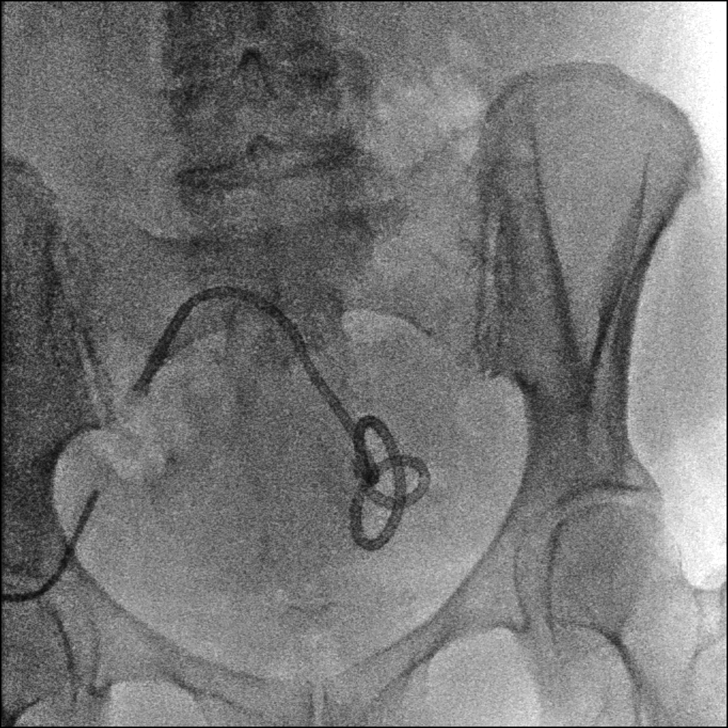
[im 2/8]
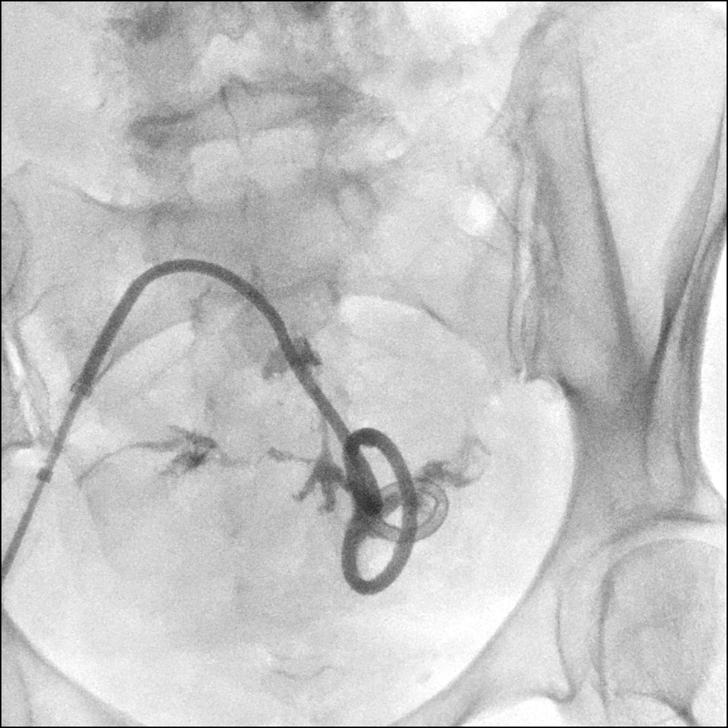
[im 3/8]
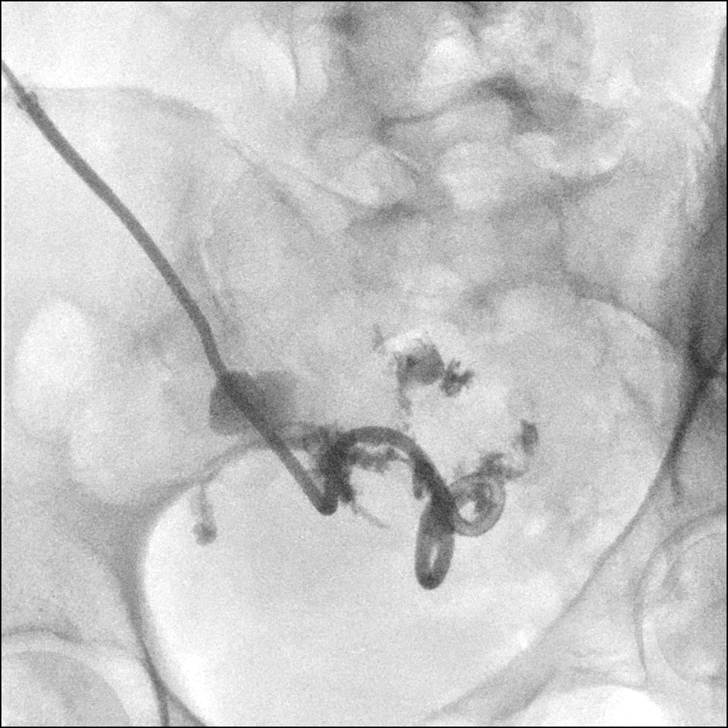
[im 4/8]
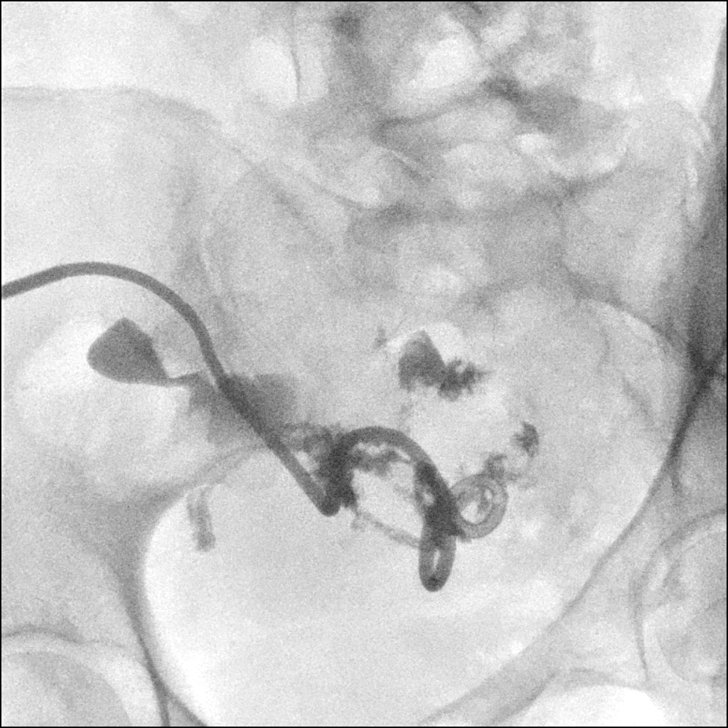
[im 5/8]
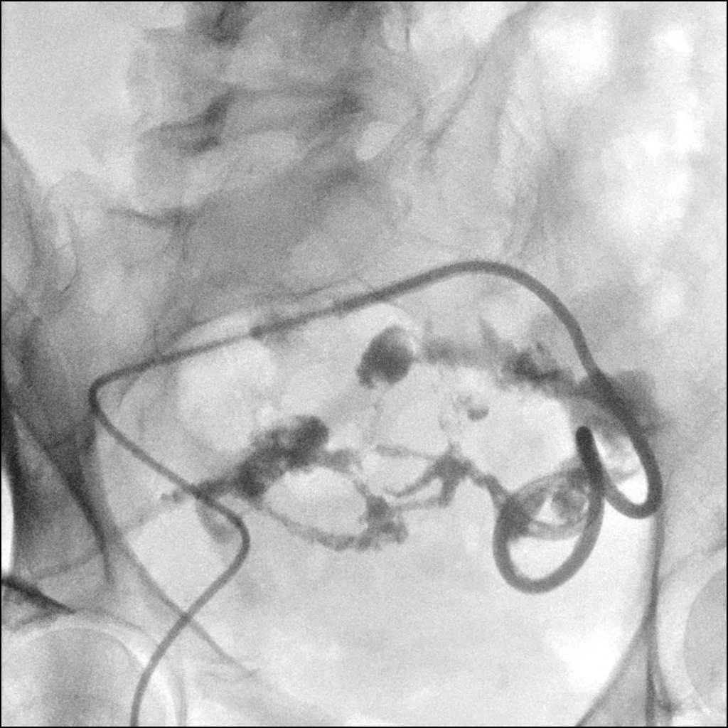
[im 6/8]
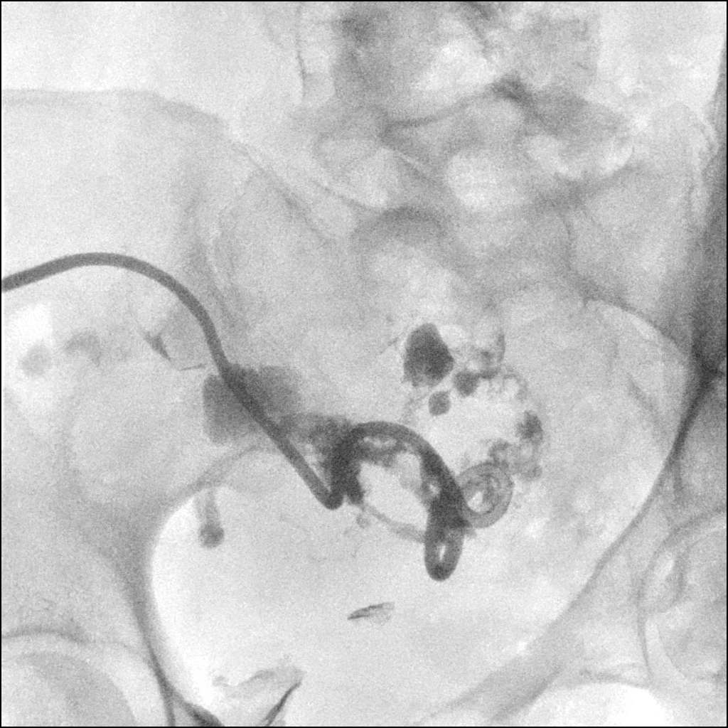
[im 7/8]
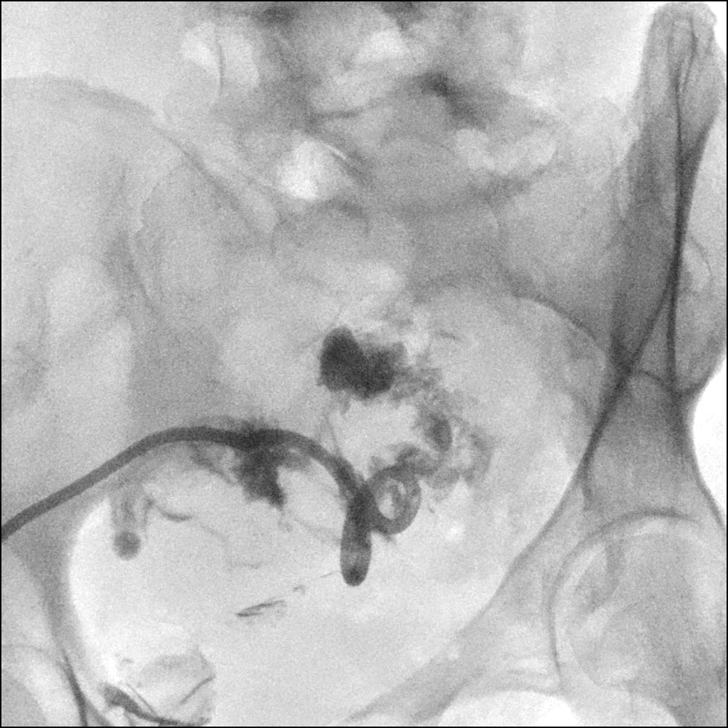
[im 8/8]
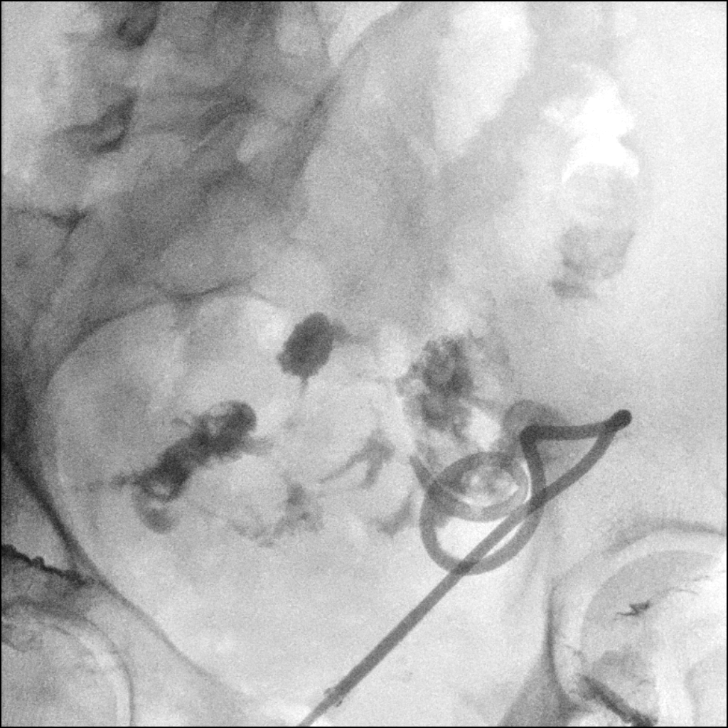

[8 of 8 positions shown; findings below may reference images not displayed]

Subsequent abdominal CT demonstrates resolution drain abscess
however drainage catheter injection demonstrates a persistent
fistula.

drainage catheter evaluation and management

The patient is not flushing her percutaneous drainage catheter.
Patient reports little to no output from the percutaneous drainage
catheter for the past several days. Patient continues to take her
prescribed course of antibiotics.

Note, patient also underwent a right trans gluteal approach
percutaneous drainage catheter placement on 05/06/2017 however this
was subsequently removed on 05/20/2017.

EXAM:
ABSCESS INJECTION
06/18/2017;
CT abdomen and pelvis - 07/02/2017; 06/18/2017; 05/20/2017;
CT-guided percutaneous drainage catheter placement - 06/10/2017

CONTRAST:  20 cc Omnipaque 300-administered via the existing
percutaneous drainage catheter

FLUOROSCOPY TIME:  3 minutes, 6 seconds (148 mGy)
A preprocedural spot fluoroscopic image was obtained of the lower
pelvis and existing percutaneous drainage catheter

Multiple spot fluoroscopic and radiographic images were obtained in
various obliquities following the injection of a small amount of
contrast via the existing percutaneous drainage catheter.

Images reviewed and discussed with the patient. The drainage
catheter was flushed with a small amount of saline and reconnected
to a gravity bag. A dressing was placed. The patient tolerated the
procedure well without immediate postprocedural complication.
FINDINGS: Preprocedural spot fluoroscopic image demonstrates unchanged
positioning left lower abdominal/pelvic drainage catheter. Contrast
injection demonstrates persistent communication between the
decompressed abscess cavity and the adjacent intestines.
IMPRESSION: Persistent fistulous connection between the left lower
abdominal/pelvic drainage catheter and the adjacent intestines.

PLAN:
- The patient was again instructed not to flush the percutaneous
drainage catheter.

- The patient was instructed to maintain diligent records regarding
daily drainage catheter output.

- The patient return to the [REDACTED] in
2 weeks for repeat drainage catheter injection. If fistula persists
the time of this injection, the patient may require definitive
surgical repair.

## 2017-07-16 NOTE — Progress Notes (Signed)
Patient ID: Kim Snow, female   DOB: 12-14-1960, 57 y.o.   MRN: [id number]         Chief Complaint: Drainage Catheter Evaluation and Management  Referring Physician(s): Kinsinger,Luke Clifton Custard  History of Present Illness: Kim Snow is a 57 y.o. female with history of partial small resection and lysis of adhesions related to small bowel perforation, post CT-guided percutaneous drainage catheter placement for postoperative intra-abdominal abscess on 06/10/2017.  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  As such, patient returns to the interventional radiology drain clinic for drainage catheter evaluation and management.  She is accompanied by her mother though serves as her own historian  Patient is not flushing her percutaneous drainage catheter.  She reports little to no output from the percutaneous drainage catheter for the past several days.    She continues to take her prescribed course of antibiotics.  She denies worsening abdominal pain, fever or chills.  Note, patient additionally underwent a prior right transgluteal post percutaneous drainage catheter placement on 05/06/2017, however that this drainage catheter was removed on 05/20/2017.  Patient is very interested in having the drainage catheter removed.  Past Medical History:  Diagnosis Date  . Anemia   . Anxiety   . Asthma   . Depression   . GERD (gastroesophageal reflux disease)   . Hypertension   . Insomnia   . Migraine     Past Surgical History:  Procedure Laterality Date  . CESAREAN SECTION    . CHOLECYSTECTOMY    . IR RADIOLOGIST EVAL & MGMT  05/20/2017  . IR RADIOLOGIST EVAL & MGMT  06/18/2017  . IR RADIOLOGIST EVAL & MGMT  07/02/2017  . IR RADIOLOGIST EVAL & MGMT  07/16/2017  . KNEE ARTHROSCOPY    . LAPAROSCOPY N/A 05/22/2017   Procedure: LAPAROSCOPY DIAGNOSTIC, LYSIS OF  ADHESIONS, DRAINAGE OF ABCESS, RIGID PROCTOSCOPY, SMALL BOWEL RESECTION X2;  Surgeon: Kinsinger, De Blanch, MD;  Location: [facility];  Service: General;  Laterality: N/A;    Allergies: Patient has no known allergies.  Medications: Prior to Admission medications   Medication Sig Start Date End Date Taking? Authorizing Provider  acetaminophen (TYLENOL) 325 MG tablet Take 2 tablets (650 mg total) by mouth every 6 (six) hours as needed for mild pain (or temp > 100). Patient not taking: Reported on 06/10/2017 05/11/17   Berna Bue, MD  ALPRAZolam Prudy Feeler) 1 MG tablet Take 1 mg by mouth at bedtime as needed for sleep.  02/11/17   [provider]  CALCIUM PO Take 1,000 mg by mouth daily.    [provider]  Cyanocobalamin (VITAMIN B 12 PO) Take 1 tablet by mouth daily.    [provider]  docusate sodium (COLACE) 100 MG capsule Take 1 capsule (100 mg total) by mouth 2 (two) times daily. Patient not taking: Reported on 06/10/2017 05/11/17   Berna Bue, MD  losartan-hydrochlorothiazide (HYZAAR) 100-25 MG tablet Take 1 tablet by mouth daily. 03/11/17   [provider]  meloxicam (MOBIC) 15 MG tablet Take 15 mg by mouth daily at 12 noon. 04/21/17   [provider]  omeprazole (PRILOSEC) 40 MG capsule Take 40 mg by mouth daily. 03/28/17   [provider]  ondansetron (ZOFRAN-ODT) 4 MG disintegrating tablet Take 1 tablet (4 mg total) by mouth every 6 (six) hours as needed for nausea or vomiting. 06/12/17   Meuth, Lina Sar, PA-C  oxyCODONE (OXY  IR/ROXICODONE) 5 MG immediate release tablet Take 1 tablet (5 mg total) by mouth every 6 (six) hours as needed for severe pain. 06/12/17   Meuth, Brooke A, PA-C  Polysaccharide Iron Complex (IFEREX 150 PO) Take 150 mg by mouth daily.    [provider]     No family history on file.  Social History   Socioeconomic History  . Marital status: Married    Spouse name: Not on file  . Number of children:  Not on file  . Years of education: Not on file  . Highest education level: Not on file  Occupational History  . Not on file  Social Needs  . Financial resource strain: Not on file  . Food insecurity:    Worry: Not on file    Inability: Not on file  . Transportation needs:    Medical: Not on file    Non-medical: Not on file  Tobacco Use  . Smoking status: Never Smoker  . Smokeless tobacco: Never Used  Substance and Sexual Activity  . Alcohol use: Yes    Comment: social  . Drug use: Never  . Sexual activity: Not on file  Lifestyle  . Physical activity:    Days per week: Not on file    Minutes per session: Not on file  . Stress: Not on file  Relationships  . Social connections:    Talks on phone: Not on file    Gets together: Not on file    Attends religious service: Not on file    Active member of club or organization: Not on file    Attends meetings of clubs or organizations: Not on file    Relationship status: Not on file  Other Topics Concern  . Not on file  Social History Narrative  . Not on file    ECOG Status: 1 - Symptomatic but completely ambulatory  Review of Systems: A 12 point ROS discussed and pertinent positives are indicated in the HPI above.  All other systems are negative.  Review of Systems  Constitutional: Negative for activity change, appetite change, fatigue and fever.  Gastrointestinal: Negative for abdominal pain.    Vital Signs: BP 119/68   Pulse 89   SpO2 100%   Physical Exam  Abdominal:    Location of the patient's percutaneous drainage catheter.     Imaging:  Selected images from CT scan performed 522/2019 and 06/18/2017 and fluoroscopic guided drainage catheter injection performed 07/02/2017 were reviewed in detail.  Ct Abdomen Pelvis W Contrast  Result Date: 07/02/2017 CLINICAL DATA:  Perforated sigmoid diverticulitis 05/05/2017. Abscess drain catheter placement 05/06/2017, removed 05/20/2017 Lysis of adhesions with  enterotomy repairs 05/22/2017 CT abscess drain catheter placement 06/10/2017 Partial abscess resolution on follow-up scan 06/18/2017, drain left in place She has had about 5 mL output daily. Twice daily 5 mL saline flushes. EXAM: CT ABDOMEN AND PELVIS WITH CONTRAST TECHNIQUE: Multidetector CT imaging of the abdomen and pelvis was performed using the standard protocol following bolus administration of intravenous contrast. CONTRAST:  ISOVUE-300 IOPAMIDOL (ISOVUE-300) INJECTION 61% COMPARISON:  06/18/2017 FINDINGS: Lower chest: No acute abnormality. Hepatobiliary: No focal liver abnormality is seen. Status post cholecystectomy. No biliary dilatation. Pancreas: Unremarkable. No pancreatic ductal dilatation or surrounding inflammatory changes. Spleen: Normal in size without focal abnormality. Adrenals/Urinary Tract: Adrenal glands are unremarkable. Kidneys are normal, without renal calculi, focal lesion, or hydronephrosis. Bladder is unremarkable. Stomach/Bowel: Stomach is nondistended. Small bowel decompressed. Anastomotic staple lines in the right mid abdomen and right  lower quadrant. Appendix not identified. The colon is nondilated. Scattered descending and sigmoid diverticula without adjacent inflammatory/edematous change or abscess. Significant improvement of the linear gas and fluid collection extending from the left pelvic drain catheter across midline posterior to the uterus, the largest low-attenuation component measuring less than 1 cm. Vascular/Lymphatic: Portal vein patent. Aortic Atherosclerosis (ICD10-170.0) without aneurysm or stenosis. No abdominal or pelvic adenopathy. Bilateral pelvic phleboliths. Reproductive: Lobular inhomogeneous uterus suggesting multiple fibroids. No adnexal mass. Other: Stable position of left anterior pelvic percutaneous drain catheter. No ascites. No free air. 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 Mild grade 1  anterolisthesis L3-4 presumably secondary to facet disease as there is no pars defect or fracture. Negative for fracture or worrisome bone lesion. IMPRESSION: 1. Stable left pelvic drain catheter with partial resolution of loculated deep pelvic gas/fluid collection since prior study. 2. No new or undrained abdominal or pelvic collections. 3. Descending and sigmoid diverticulosis. Electronically Signed   By: Corlis Leak M.D.   On: 07/02/2017 09:48   Ct Abdomen Pelvis W Contrast  Result Date: 06/18/2017 CLINICAL DATA:  diverticular abscess, status post percutaneous drain catheter placement. Decreasing output. EXAM: CT ABDOMEN AND PELVIS WITH CONTRAST TECHNIQUE: Multidetector CT imaging of the abdomen and pelvis was performed using the standard protocol following bolus administration of intravenous contrast. CONTRAST:  ISOVUE-300 IOPAMIDOL (ISOVUE-300) INJECTION 61% COMPARISON:  06/10/2017 and previous FINDINGS: Lower chest: No acute abnormality. Hepatobiliary: No focal liver abnormality is seen. Status post cholecystectomy. No biliary dilatation. Pancreas: Unremarkable. No pancreatic ductal dilatation or surrounding inflammatory changes. Spleen: Normal in size without focal abnormality. Adrenals/Urinary Tract: No adrenal hemorrhage or renal injury identified. Bladder is unremarkable. Stomach/Bowel: Stomach and small bowel nondistended. Anastomotic staple lines in the region of the hepatic flexure and right lower quadrant. Colon nondilated. Scattered descending and sigmoid diverticula. Vascular/Lymphatic: Minimal Aortic Atherosclerosis (ICD10-170.0). No aneurysm. No abdominal or pelvic adenopathy localized. Reproductive: Uterus and bilateral adnexa are unremarkable. Other: No ascites.  No free air. Percutaneous drain catheter into the left pelvis. Partial interval decompression of the elongated complex gas and fluid collection seen previously, extending into the posterior right pelvis. It is difficult to  differentiate decompressed adjacent small bowel from extraluminal collections in the right lower quadrant and right pelvis. No new undrained collections. Musculoskeletal: Facet DJD L3-4 and L4-5 with probable synovial cyst extending into the spinal canal on the left L3-4. Mild grade 1 anterolisthesis L3-4 probably secondary to facet disease. Negative for fracture or worrisome bone lesion. IMPRESSION: 1. Interval partial decompression of complex pelvic fluid collection post percutaneous drain catheter placement. I would recommend the use of oral contrast as well as IV contrast on follow-up scans to help differentiate small-bowel from extraluminal collections. 2. No new undrained collections. 3. Descending and sigmoid diverticulosis. Electronically Signed   By: Corlis Leak M.D.   On: 06/18/2017 16:41   Dg Sinus/fist Tube Chk-non Gi  Result Date: 07/16/2017 CLINICAL DATA:  History of partial small bowel resection and lysis of adhesions related to small bowel perforation post CT-guided percutaneous drainage catheter placement for postoperative intra-abdominal abscess on 06/10/2017. 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 Patient returns to the Interventional Radiology drain Clinic for drainage catheter evaluation and management The patient is not flushing her percutaneous drainage catheter. Patient reports little to no output from the percutaneous drainage catheter for the past several days. Patient continues to take her prescribed course  of antibiotics. Note, patient also underwent a right trans gluteal approach percutaneous drainage catheter placement on 05/06/2017 however this was subsequently removed on 05/20/2017. EXAM: ABSCESS INJECTION COMPARISON:  Drainage catheter injection - 07/02/2017; 06/18/2017; CT abdomen and pelvis - 07/02/2017; 06/18/2017; 05/20/2017; CT-guided percutaneous drainage catheter placement -  06/10/2017 CONTRAST:  20 cc Omnipaque 300-administered via the existing percutaneous drainage catheter FLUOROSCOPY TIME:  3 minutes, 6 seconds (148 mGy) TECHNIQUE: The patient was positioned supine on the fluoroscopy table. A preprocedural spot fluoroscopic image was obtained of the lower pelvis and existing percutaneous drainage catheter Multiple spot fluoroscopic and radiographic images were obtained in various obliquities following the injection of a small amount of contrast via the existing percutaneous drainage catheter. Images reviewed and discussed with the patient. The drainage catheter was flushed with a small amount of saline and reconnected to a gravity bag. A dressing was placed. The patient tolerated the procedure well without immediate postprocedural complication. FINDINGS: Preprocedural spot fluoroscopic image demonstrates unchanged positioning left lower abdominal/pelvic drainage catheter. Contrast injection demonstrates persistent communication between the decompressed abscess cavity and the adjacent intestines. IMPRESSION: Persistent fistulous connection between the left lower abdominal/pelvic drainage catheter and the adjacent intestines. PLAN: - The patient was again instructed not to flush the percutaneous drainage catheter. - The patient was instructed to maintain diligent records regarding daily drainage catheter output. - The patient return to the interventional radiology drain clinic in 2 weeks for repeat drainage catheter injection. If fistula persists the time of this injection, the patient may require definitive surgical repair. Electronically Signed   By: Simonne ComeJohn  Jamelyn Bovard M.D.   On: 07/16/2017 12:30   Dg Sinus/fist Tube Chk-non Gi  Result Date: 07/02/2017 CLINICAL DATA:  Postprocedural intraabdominal abscess EXAM: ABSCESS DRAIN CATHETER INJECTION UNDER FLUOROSCOPY TECHNIQUE: The procedure, risks (including but not limited to bleeding, infection, organ damage ), benefits, and alternatives  were explained to the patient. Questions regarding the procedure were encouraged and answered. The patient understands and consents to the procedure. Survey fluoroscopic inspection reveals stable position of left pelvic pigtail drain catheter. 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 As this was a new and unexpected finding, additional injection was performed confirming intraluminal passage of the contrast. This was confirmed on bilateral oblique views. There has been near complete resolution of the complex low pelvic abscess seen on the previous injection of 06/18/2017. IMPRESSION: 1. 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 Electronically Signed   By: Corlis Leak  Hassell M.D.   On: 07/02/2017 09:51   Dg Sinus/fist Tube Chk-non Gi  Result Date: 06/18/2017 CLINICAL DATA:  Postprocedural intraabdominal abscess PROCEDURE: ABSCESS DRAIN CATHETER INJECTION UNDER FLUOROSCOPY TECHNIQUE: The procedure, risks (including but not limited to bleeding, infection, organ damage ), benefits, and alternatives were explained to the patient. Questions regarding the procedure were encouraged and answered. The patient understands and consents to the procedure. Survey fluoroscopic inspection reveals stable position of pigtail drain catheter in the left pelvis. Injection demonstrates filling of a complex extraluminal fluid collection, bilobed, with a thin tract extending across midline to the right-sided component. No definite intraluminal opacification. FLUOROSCOPY TIME:  24 seconds; 16 mGy IMPRESSION: 1. Persistent complex pelvic collection post percutaneous drain catheter placement. No definite fistula. Catheter will remain to external drainage with follow-up scheduled in 2 weeks. Electronically Signed   By: Corlis Leak  Hassell M.D.   On: 06/18/2017 16:45   Ir Radiologist Eval & Mgmt  Result Date: 07/16/2017 Please refer to notes tab  for details about interventional  procedure. (Op Note)  Ir Radiologist Eval & Mgmt  Result Date: 07/02/2017 Please refer to notes tab for details about interventional procedure. (Op Note)  Ir Radiologist Eval & Mgmt  Result Date: 06/18/2017 Please refer to notes tab for details about interventional procedure. (Op Note)   Labs:  CBC: Recent Labs    06/09/17 1710 06/10/17 0131 06/10/17 0853 06/11/17 0701  WBC 18.9* 15.0* 14.3* 10.0  HGB 8.0* 7.4* 6.8* 8.0*  HCT 24.9* 22.4* 20.8* 24.8*  PLT 793* 628* 627* 569*    COAGS: Recent Labs    05/06/17 0539 06/10/17 1142  INR 1.04 1.04  APTT 29  --     BMP: Recent Labs    05/29/17 0416 06/09/17 1710 06/10/17 0853 06/11/17 0701 06/12/17 0644  NA 141 134*  --  138 139  K 4.0 3.8  --  3.8 4.1  CL 102 97*  --  104 103  CO2 28 24  --  25 26  GLUCOSE 96 132*  --  107* 116*  BUN 5* 29*  --  9 5*  CALCIUM 8.8* 9.2  --  8.4* 8.5*  CREATININE 0.81 1.22* 0.94 0.64 0.72  GFRNONAA >60 48* >60 >60 >60  GFRAA >60 56* >60 >60 >60    LIVER FUNCTION TESTS: Recent Labs    05/21/17 1432 06/10/17 0131 06/11/17 0701 06/12/17 0644  BILITOT 0.3 0.6 0.6 0.3  AST 23 31 90* 39  ALT 22 21 49 42  ALKPHOS 161* 255* 364* 336*  PROT 7.9 6.6 6.0* 6.1*  ALBUMIN 3.4* 2.1* 1.8* 1.8*    TUMOR MARKERS: No results for input(s): AFPTM, CEA, CA199, CHROMGRNA in the last 8760 hours.  Assessment and Plan:  Kim Snow is a 57 y.o. female with history of partial small resection and lysis of adhesions related to small bowel perforation, post CT-guided percutaneous drainage catheter placement for postoperative intra-abdominal abscess on 06/10/2017.  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  Unfortunately, despite lack of significant output from the drain for the past several days, drainage catheter injection performed today demonstrates a persistent  facial connection between the end of the drainage catheter and the adjacent intestines.  As such, it is premature to remove the drainage catheter at this time.  As such, patient will return to the interventional radiology drain clinic in approximately 2 weeks for repeat drain injection (no CT).  Plan:  - Do NOT flush the percutaneous drainage catheter. - The Patient was instructed to maintain diligent records regarding daily drainage catheter output.  If subsequent drainage catheter injection demonstrates a present fistulous connection, definitive operative repair may be warranted.  Patient was instructed to call the interventional radiology drain clinic with any interval questions or concerns.  A copy of this report was sent to the requesting provider on this date.  Electronically Signed: Simonne Come 07/16/2017, 12:37 PM   I spent a total of 15 Minutes in face to face in clinical consultation, greater than 50% of which was counseling/coordinating care for drainage catheter evaluation and management

## 2017-07-31 ENCOUNTER — Encounter: Payer: Self-pay | Admitting: Radiology

## 2017-07-31 ENCOUNTER — Ambulatory Visit
Admission: RE | Admit: 2017-07-31 | Discharge: 2017-07-31 | Disposition: A | Discharge status: Home / Self Care | Payer: BC Managed Care – PPO | Source: Ambulatory Visit | Attending: General Surgery | Admitting: General Surgery

## 2017-07-31 ENCOUNTER — Other Ambulatory Visit: Payer: Self-pay | Admitting: General Surgery

## 2017-07-31 DIAGNOSIS — T8143XA Infection following a procedure, organ and space surgical site, initial encounter: Secondary | ICD-10-CM

## 2017-07-31 DIAGNOSIS — T8149XA Infection following a procedure, other surgical site, initial encounter: Principal | ICD-10-CM

## 2017-07-31 HISTORY — PX: IR RADIOLOGIST EVAL & MGMT: IMG5224

## 2017-07-31 IMAGING — RF DG SINUS / FISTULA TRACT / ABSCESSOGRAM
3 series · 7 of 7 positions shown · non-contrast
Comparison: none

INDICATION: Lower abdominal abscess drain with fistula

[Series 1: one shot · 1 of 1 slices shown (1 of 2)]
[im 1/1]
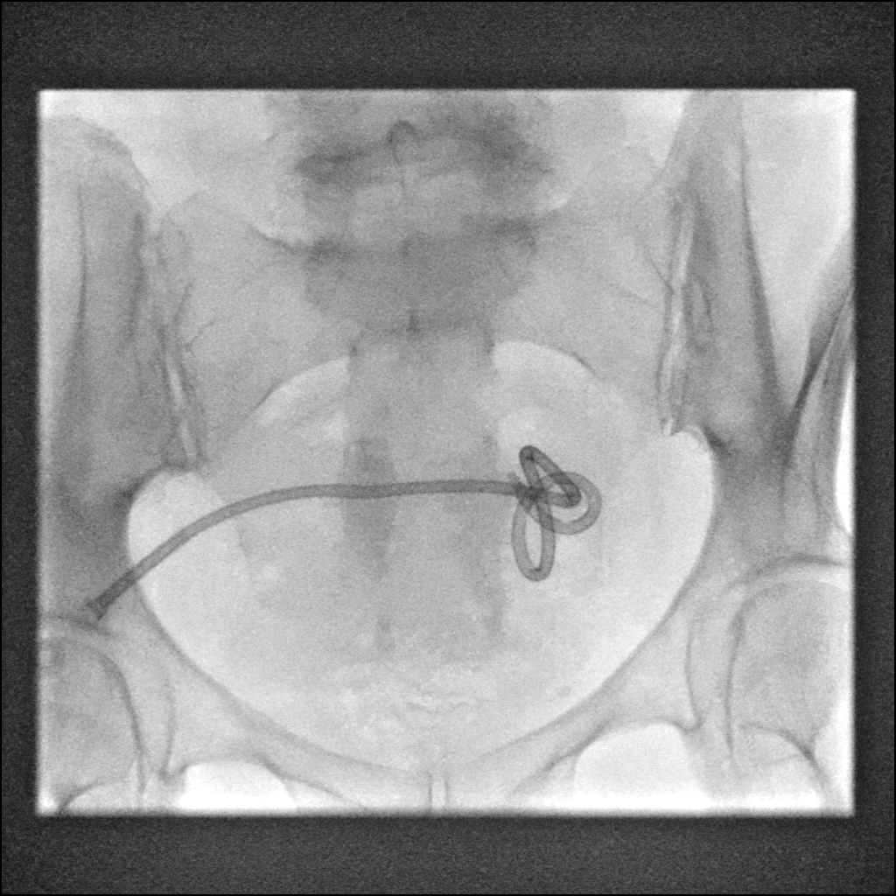

[Series 2: sequence · 4 of 44 frames shown]
[frame 7/44]
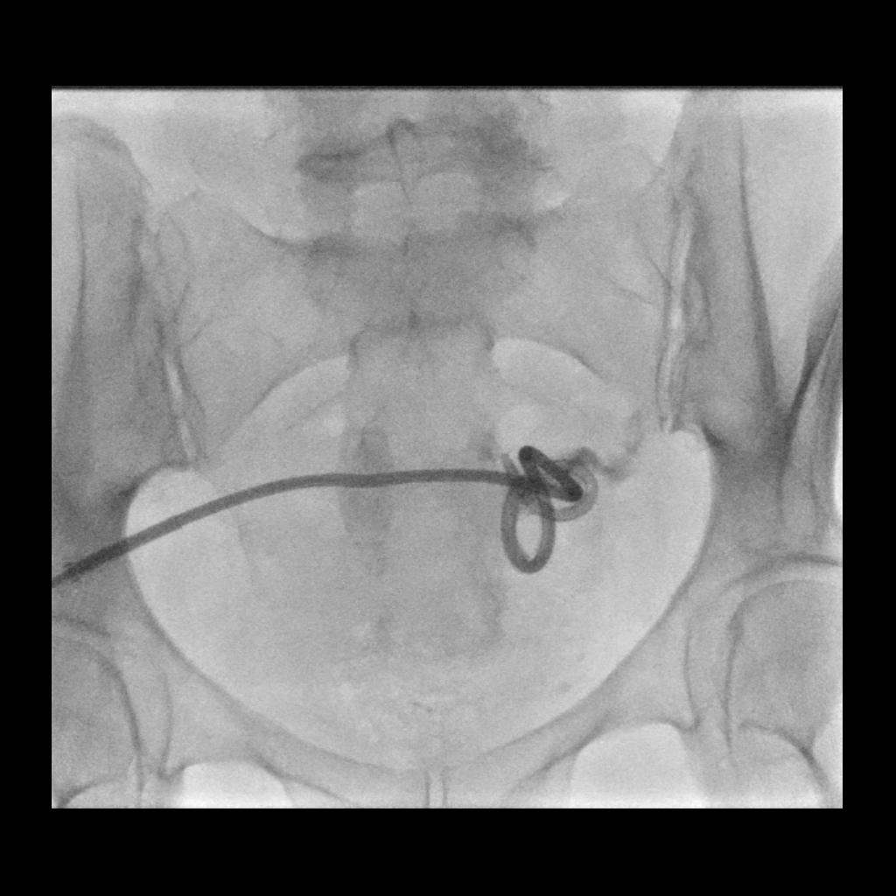
[frame 23/44]
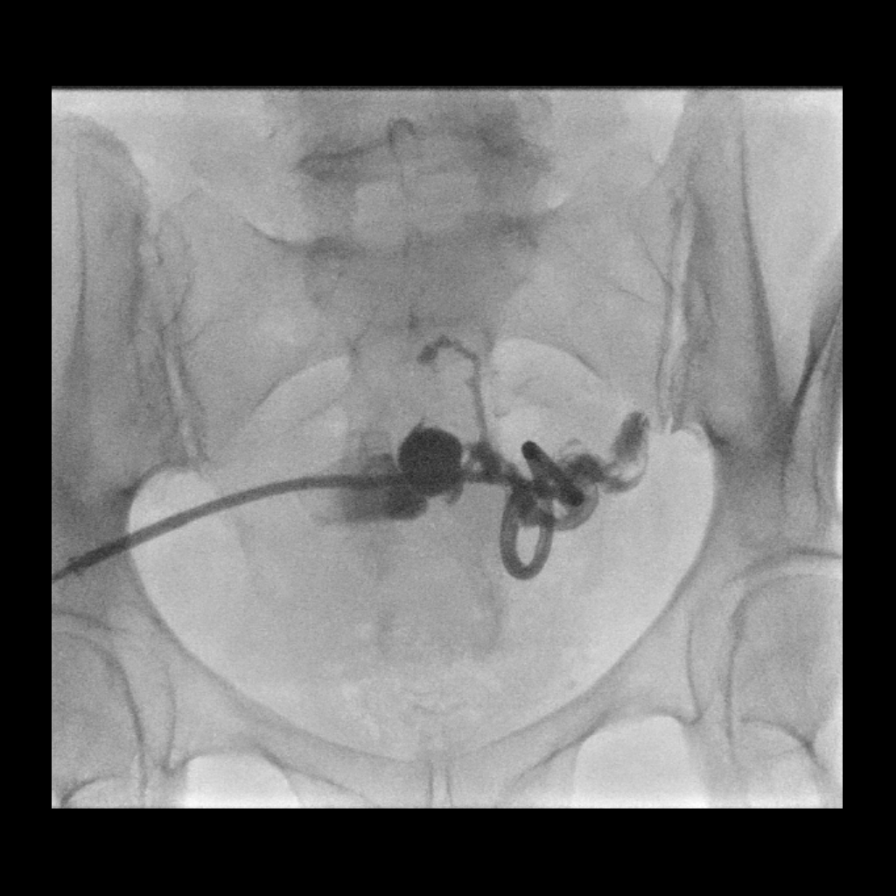
[frame 26/44]
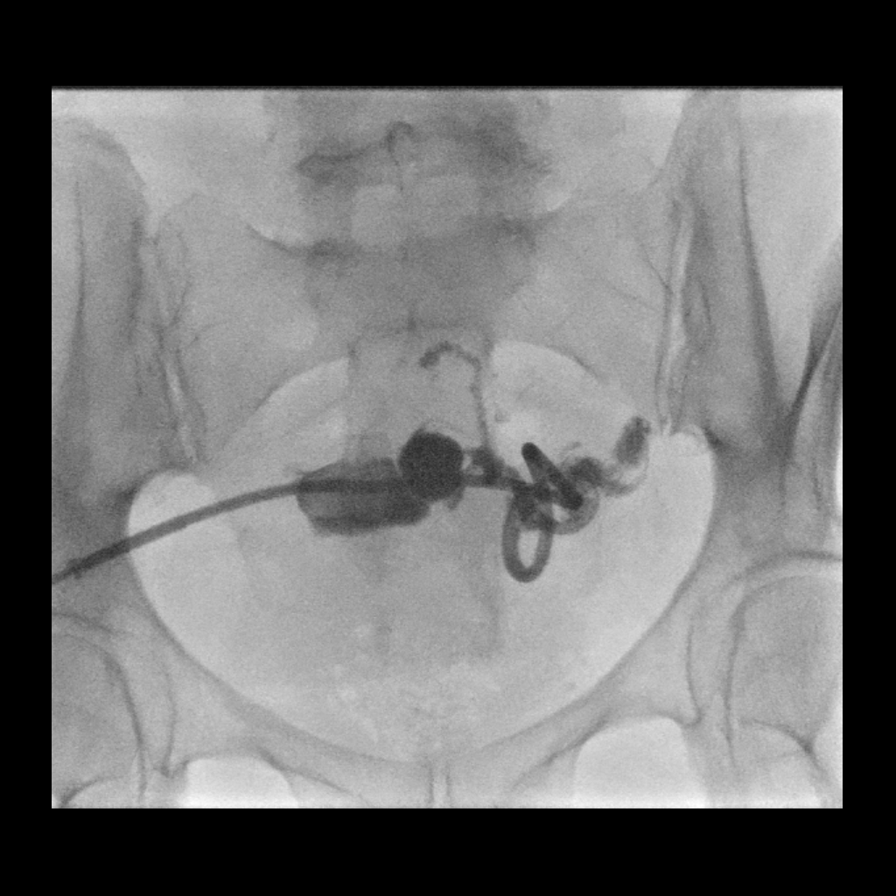
[frame 38/44]
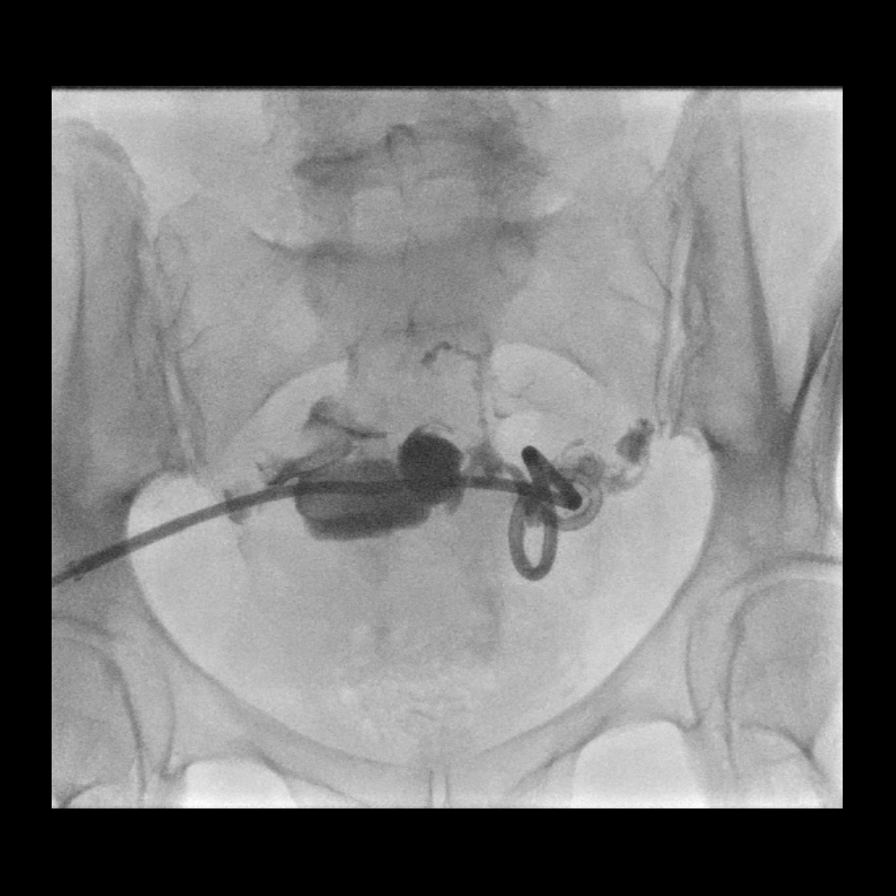

[Series 3: one shot · 2 of 2 slices shown (2 of 2)]
[im 1/2]
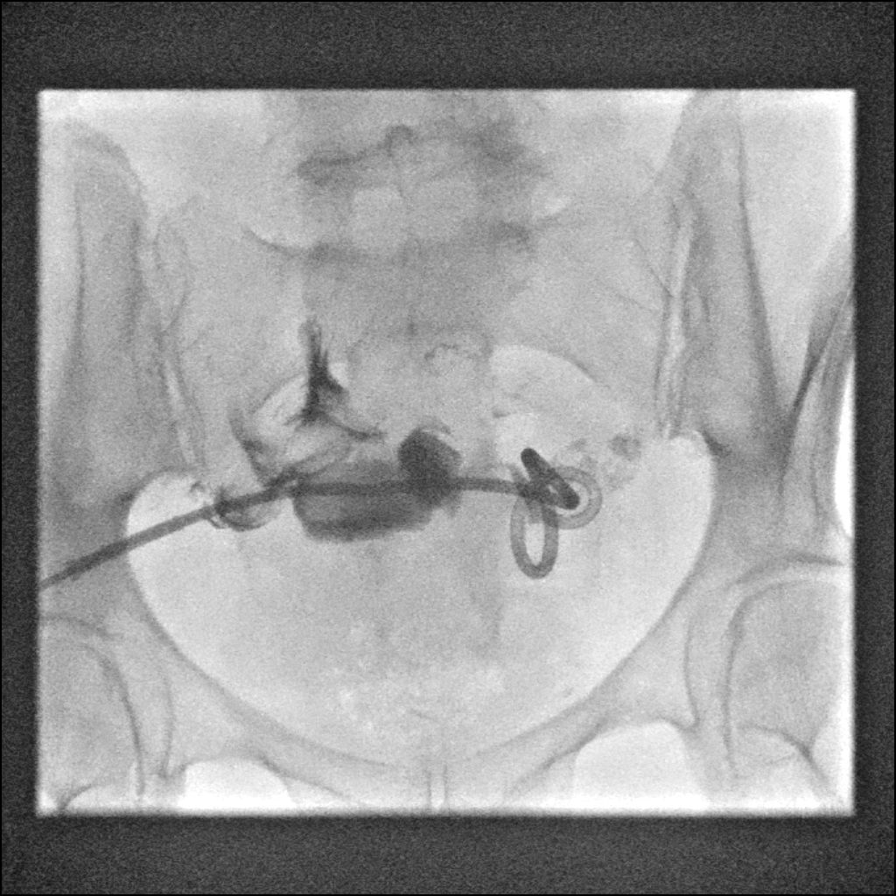
[im 2/2]
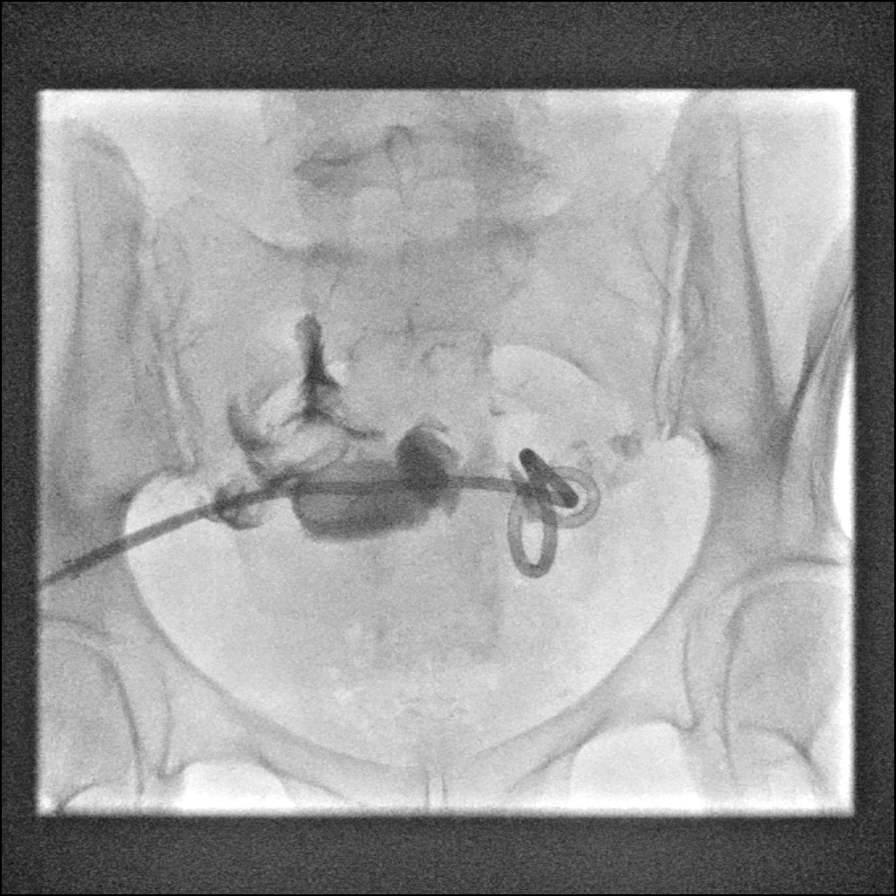

[7 of 7 positions shown; findings below may reference images not displayed]

EXAM:
ABSCESS DRAIN INJECTION

FLUOROSCOPY TIME:  18 seconds.  Ten mGy.

MEDICATIONS:
The patient is currently admitted to the hospital and receiving
intravenous antibiotics. The antibiotics were administered within an
appropriate time frame prior to the initiation of the procedure.

ANESTHESIA/SEDATION:
None

COMPLICATIONS:
None immediate.

PROCEDURE:
Informed written consent was obtained from the patient after a
thorough discussion of the procedural risks, benefits and
alternatives. All questions were addressed. Maximal Sterile Barrier
Technique was utilized including caps, mask, sterile gowns, sterile
gloves, sterile drape, hand hygiene and skin antiseptic. A timeout
was performed prior to the initiation of the procedure.

A scout image was performed. Subsequently, contrast was injected and
imaging was performed. The catheter was then flushed and attached to
a JP bulb.
FINDINGS: The abscess cavity remains decompressed. The fistula to adjacent
sigmoid colon is unchanged. Contrast fills the fistulous tract and
adjacent sigmoid colon.
IMPRESSION: There is a persistent fistula to the adjacent sigmoid colon.

## 2017-07-31 NOTE — Progress Notes (Signed)
Patient ID: Kim Snow, female   DOB: Dec 30, 1960, 57 y.o.   MRN: [id number]         Chief Complaint: Patient was seen in consultation today for No chief complaint on file.  at the request of Rodman Pickle  Referring Physician(s): Rodman Pickle  History of Present Illness: Kim Snow is a 57 y.o. female returns for management of her lower abdominal abscess drain. This drain was placed in April of this year. Subsequent imaging demonstrated that the abscess cavity had decompressed but a persistent fistula was noted. Her last study was 2 weeks ago. She denies any fevers or abdominal pain. She is not on antibiotics. She describes a minimal output.  Past Medical History:  Diagnosis Date  . Anemia   . Anxiety   . Asthma   . Depression   . GERD (gastroesophageal reflux disease)   . Hypertension   . Insomnia   . Migraine     Past Surgical History:  Procedure Laterality Date  . CESAREAN SECTION    . CHOLECYSTECTOMY    . IR RADIOLOGIST EVAL & MGMT  05/20/2017  . IR RADIOLOGIST EVAL & MGMT  06/18/2017  . IR RADIOLOGIST EVAL & MGMT  07/02/2017  . IR RADIOLOGIST EVAL & MGMT  07/16/2017  . KNEE ARTHROSCOPY    . LAPAROSCOPY N/A 05/22/2017   Procedure: LAPAROSCOPY DIAGNOSTIC, LYSIS OF ADHESIONS, DRAINAGE OF ABCESS, RIGID PROCTOSCOPY, SMALL BOWEL RESECTION X2;  Surgeon: Kinsinger, De Blanch, MD;  Location: [facility];  Service: General;  Laterality: N/A;    Allergies: Patient has no known allergies.  Medications: Prior to Admission medications   Medication Sig Start Date End Date Taking? Authorizing Provider  acetaminophen (TYLENOL) 325 MG tablet Take 2 tablets (650 mg total) by mouth every 6 (six) hours as needed for mild pain (or temp > 100). Patient not taking: Reported on 06/10/2017 05/11/17   Berna Bue, MD  ALPRAZolam Prudy Feeler) 1 MG tablet Take 1 mg by mouth at bedtime as needed for sleep.  02/11/17   [provider]  CALCIUM PO Take 1,000 mg by  mouth daily.    [provider]  Cyanocobalamin (VITAMIN B 12 PO) Take 1 tablet by mouth daily.    [provider]  docusate sodium (COLACE) 100 MG capsule Take 1 capsule (100 mg total) by mouth 2 (two) times daily. Patient not taking: Reported on 06/10/2017 05/11/17   Berna Bue, MD  losartan-hydrochlorothiazide (HYZAAR) 100-25 MG tablet Take 1 tablet by mouth daily. 03/11/17   [provider]  meloxicam (MOBIC) 15 MG tablet Take 15 mg by mouth daily at 12 noon. 04/21/17   [provider]  omeprazole (PRILOSEC) 40 MG capsule Take 40 mg by mouth daily. 03/28/17   [provider]  ondansetron (ZOFRAN-ODT) 4 MG disintegrating tablet Take 1 tablet (4 mg total) by mouth every 6 (six) hours as needed for nausea or vomiting. 06/12/17   Meuth, Brooke A, PA-C  oxyCODONE (OXY IR/ROXICODONE) 5 MG immediate release tablet Take 1 tablet (5 mg total) by mouth every 6 (six) hours as needed for severe pain. 06/12/17   Meuth, Brooke A, PA-C  Polysaccharide Iron Complex (IFEREX 150 PO) Take 150 mg by mouth daily.    [provider]     No family history on file.  Social History   Socioeconomic History  . Marital status: Married    Spouse name: Not on file  . Number of children: Not on file  . Years  of education: Not on file  . Highest education level: Not on file  Occupational History  . Not on file  Social Needs  . Financial resource strain: Not on file  . Food insecurity:    Worry: Not on file    Inability: Not on file  . Transportation needs:    Medical: Not on file    Non-medical: Not on file  Tobacco Use  . Smoking status: Never Smoker  . Smokeless tobacco: Never Used  Substance and Sexual Activity  . Alcohol use: Yes    Comment: social  . Drug use: Never  . Sexual activity: Not on file  Lifestyle  . Physical activity:    Days per week: Not on file    Minutes per session: Not on file  . Stress: Not on file  Relationships  . Social  connections:    Talks on phone: Not on file    Gets together: Not on file    Attends religious service: Not on file    Active member of club or organization: Not on file    Attends meetings of clubs or organizations: Not on file    Relationship status: Not on file  Other Topics Concern  . Not on file  Social History Narrative  . Not on file     Review of Systems: A 12 point ROS discussed and pertinent positives are indicated in the HPI above.  All other systems are negative.  Review of Systems  Vital Signs: There were no vitals taken for this visit.  Physical Exam  Constitutional: She is oriented to person, place, and time. She appears well-developed and well-nourished.  HENT:  Head: Normocephalic and atraumatic.  Abdominal: Soft. There is no tenderness.  The lower abdominal drain site is clean and dry. There is no fluid in the drainage bag.  Neurological: She is alert and oriented to person, place, and time.  Skin: Skin is warm and dry.       Imaging:  A fluoroscopic guided drain injection today demonstrates a persistent fistula to adjacent bowel.  Ct Abdomen Pelvis W Contrast  Result Date: 07/02/2017 CLINICAL DATA:  Perforated sigmoid diverticulitis 05/05/2017. Abscess drain catheter placement 05/06/2017, removed 05/20/2017 Lysis of adhesions with enterotomy repairs 05/22/2017 CT abscess drain catheter placement 06/10/2017 Partial abscess resolution on follow-up scan 06/18/2017, drain left in place She has had about 5 mL output daily. Twice daily 5 mL saline flushes. EXAM: CT ABDOMEN AND PELVIS WITH CONTRAST TECHNIQUE: Multidetector CT imaging of the abdomen and pelvis was performed using the standard protocol following bolus administration of intravenous contrast. CONTRAST:  ISOVUE-300 IOPAMIDOL (ISOVUE-300) INJECTION 61% COMPARISON:  06/18/2017 FINDINGS: Lower chest: No acute abnormality. Hepatobiliary: No focal liver abnormality is seen. Status post cholecystectomy.  No biliary dilatation. Pancreas: Unremarkable. No pancreatic ductal dilatation or surrounding inflammatory changes. Spleen: Normal in size without focal abnormality. Adrenals/Urinary Tract: Adrenal glands are unremarkable. Kidneys are normal, without renal calculi, focal lesion, or hydronephrosis. Bladder is unremarkable. Stomach/Bowel: Stomach is nondistended. Small bowel decompressed. Anastomotic staple lines in the right mid abdomen and right lower quadrant. Appendix not identified. The colon is nondilated. Scattered descending and sigmoid diverticula without adjacent inflammatory/edematous change or abscess. Significant improvement of the linear gas and fluid collection extending from the left pelvic drain catheter across midline posterior to the uterus, the largest low-attenuation component measuring less than 1 cm. Vascular/Lymphatic: Portal vein patent. Aortic Atherosclerosis (ICD10-170.0) without aneurysm or stenosis. No abdominal or pelvic adenopathy. Bilateral pelvic phleboliths. Reproductive:  Lobular inhomogeneous uterus suggesting multiple fibroids. No adnexal mass. Other: Stable position of left anterior pelvic percutaneous drain catheter. No ascites. No free air. 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 Mild grade 1 anterolisthesis L3-4 presumably secondary to facet disease as there is no pars defect or fracture. Negative for fracture or worrisome bone lesion. IMPRESSION: 1. Stable left pelvic drain catheter with partial resolution of loculated deep pelvic gas/fluid collection since prior study. 2. No new or undrained abdominal or pelvic collections. 3. Descending and sigmoid diverticulosis. Electronically Signed   By: Corlis Leak  Hassell M.D.   On: 07/02/2017 09:48   Dg Sinus/fist Tube Chk-non Gi  Result Date: 07/16/2017 CLINICAL DATA:  History of partial small bowel resection and lysis of adhesions related to small bowel perforation post CT-guided  percutaneous drainage catheter placement for postoperative intra-abdominal abscess on 06/10/2017. 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 Patient returns to the Interventional Radiology drain Clinic for drainage catheter evaluation and management The patient is not flushing her percutaneous drainage catheter. Patient reports little to no output from the percutaneous drainage catheter for the past several days. Patient continues to take her prescribed course of antibiotics. Note, patient also underwent a right trans gluteal approach percutaneous drainage catheter placement on 05/06/2017 however this was subsequently removed on 05/20/2017. EXAM: ABSCESS INJECTION COMPARISON:  Drainage catheter injection - 07/02/2017; 06/18/2017; CT abdomen and pelvis - 07/02/2017; 06/18/2017; 05/20/2017; CT-guided percutaneous drainage catheter placement - 06/10/2017 CONTRAST:  20 cc Omnipaque 300-administered via the existing percutaneous drainage catheter FLUOROSCOPY TIME:  3 minutes, 6 seconds (148 mGy) TECHNIQUE: The patient was positioned supine on the fluoroscopy table. A preprocedural spot fluoroscopic image was obtained of the lower pelvis and existing percutaneous drainage catheter Multiple spot fluoroscopic and radiographic images were obtained in various obliquities following the injection of a small amount of contrast via the existing percutaneous drainage catheter. Images reviewed and discussed with the patient. The drainage catheter was flushed with a small amount of saline and reconnected to a gravity bag. A dressing was placed. The patient tolerated the procedure well without immediate postprocedural complication. FINDINGS: Preprocedural spot fluoroscopic image demonstrates unchanged positioning left lower abdominal/pelvic drainage catheter. Contrast injection demonstrates persistent communication between the decompressed  abscess cavity and the adjacent intestines. IMPRESSION: Persistent fistulous connection between the left lower abdominal/pelvic drainage catheter and the adjacent intestines. PLAN: - The patient was again instructed not to flush the percutaneous drainage catheter. - The patient was instructed to maintain diligent records regarding daily drainage catheter output. - The patient return to the interventional radiology drain clinic in 2 weeks for repeat drainage catheter injection. If fistula persists the time of this injection, the patient may require definitive surgical repair. Electronically Signed   By: Simonne ComeJohn  Watts M.D.   On: 07/16/2017 12:30   Dg Sinus/fist Tube Chk-non Gi  Result Date: 07/02/2017 CLINICAL DATA:  Postprocedural intraabdominal abscess EXAM: ABSCESS DRAIN CATHETER INJECTION UNDER FLUOROSCOPY TECHNIQUE: The procedure, risks (including but not limited to bleeding, infection, organ damage ), benefits, and alternatives were explained to the patient. Questions regarding the procedure were encouraged and answered. The patient understands and consents to the procedure. Survey fluoroscopic inspection reveals stable position of left pelvic pigtail drain catheter. 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 As this was a new and unexpected finding, additional injection was performed confirming intraluminal passage of the contrast. This was confirmed on bilateral oblique views. There has  been near complete resolution of the complex low pelvic abscess seen on the previous injection of 06/18/2017. IMPRESSION: 1. 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 Electronically Signed   By: Corlis Leak M.D.   On: 07/02/2017 09:51   Ir Radiologist Eval & Mgmt  Result Date: 07/16/2017 Please refer to notes tab for details about interventional procedure. (Op Note)  Ir Radiologist Eval & Mgmt  Result Date: 07/02/2017 Please refer to notes tab for  details about interventional procedure. (Op Note)   Labs:  CBC: Recent Labs    06/09/17 1710 06/10/17 0131 06/10/17 0853 06/11/17 0701  WBC 18.9* 15.0* 14.3* 10.0  HGB 8.0* 7.4* 6.8* 8.0*  HCT 24.9* 22.4* 20.8* 24.8*  PLT 793* 628* 627* 569*    COAGS: Recent Labs    05/06/17 0539 06/10/17 1142  INR 1.04 1.04  APTT 29  --     BMP: Recent Labs    05/29/17 0416 06/09/17 1710 06/10/17 0853 06/11/17 0701 06/12/17 0644  NA 141 134*  --  138 139  K 4.0 3.8  --  3.8 4.1  CL 102 97*  --  104 103  CO2 28 24  --  25 26  GLUCOSE 96 132*  --  107* 116*  BUN 5* 29*  --  9 5*  CALCIUM 8.8* 9.2  --  8.4* 8.5*  CREATININE 0.81 1.22* 0.94 0.64 0.72  GFRNONAA >60 48* >60 >60 >60  GFRAA >60 56* >60 >60 >60    LIVER FUNCTION TESTS: Recent Labs    05/21/17 1432 06/10/17 0131 06/11/17 0701 06/12/17 0644  BILITOT 0.3 0.6 0.6 0.3  AST 23 31 90* 39  ALT 22 21 49 42  ALKPHOS 161* 255* 364* 336*  PROT 7.9 6.6 6.0* 6.1*  ALBUMIN 3.4* 2.1* 1.8* 1.8*    TUMOR MARKERS: No results for input(s): AFPTM, CEA, CA199, CHROMGRNA in the last 8760 hours.  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 Continue drainage and follow-up in 2 weeks for repeat injection.  Thank you for this interesting consult.  I greatly enjoyed meeting RAYMONA BOSS and look forward to participating in their care.  A copy of this report was sent to the requesting provider on this date.  Electronically Signed: Falynn Ailey, ART A 07/31/2017, 10:42 AM   I spent a total of   15 Minutes in face to face in clinical consultation, greater than 50% of which was counseling/coordinating care for abscess drainage.

## 2017-08-13 ENCOUNTER — Ambulatory Visit
Admission: RE | Admit: 2017-08-13 | Discharge: 2017-08-13 | Disposition: A | Discharge status: Home / Self Care | Payer: BC Managed Care – PPO | Source: Ambulatory Visit | Attending: General Surgery | Admitting: General Surgery

## 2017-08-13 DIAGNOSIS — T8149XA Infection following a procedure, other surgical site, initial encounter: Principal | ICD-10-CM

## 2017-08-13 DIAGNOSIS — T8143XA Infection following a procedure, organ and space surgical site, initial encounter: Secondary | ICD-10-CM

## 2017-08-13 HISTORY — PX: IR RADIOLOGIST EVAL & MGMT: IMG5224

## 2017-08-13 IMAGING — RF DG SINUS / FISTULA TRACT / ABSCESSOGRAM
3 series · 10 of 10 positions shown · non-contrast
Comparison: Multiple previous fluoroscopic guided drainage catheter
injections, most recently on 07/31/2017;

CLINICAL DATA: History of partial small bowel resection and lysis
of adhesions related to small bowel perforation, post CT-guided
percutaneous drainage catheter placement for postoperative
intra-abdominal abscess on 06/10/2017.
TECHNIQUE: The patient was positioned supine on the fluoroscopy table.

[Series 1: one shot · 1 of 1 slices shown (1 of 2)]
[im 1/1]
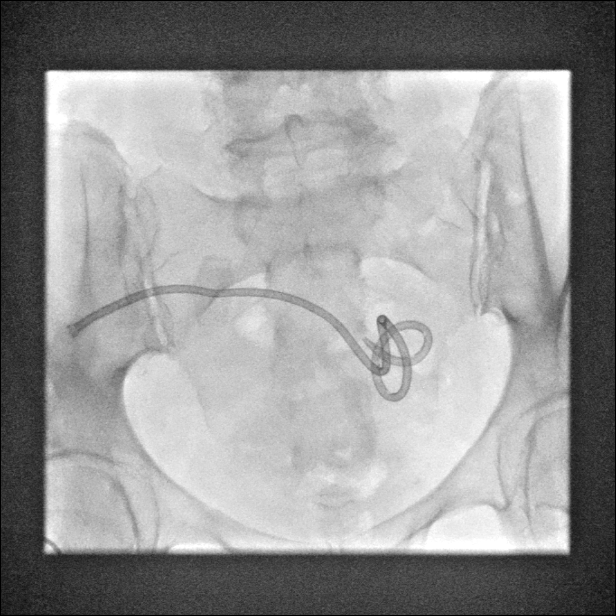

[Series 2: sequence · 4 of 16 frames shown]
[frame 3/16]
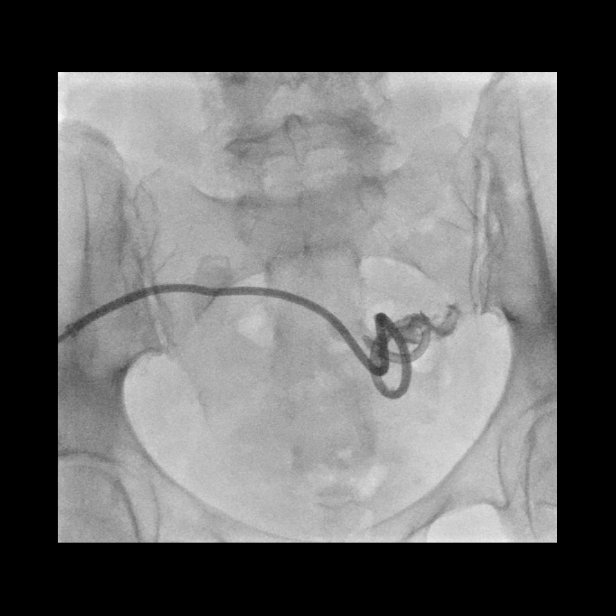
[frame 9/16]
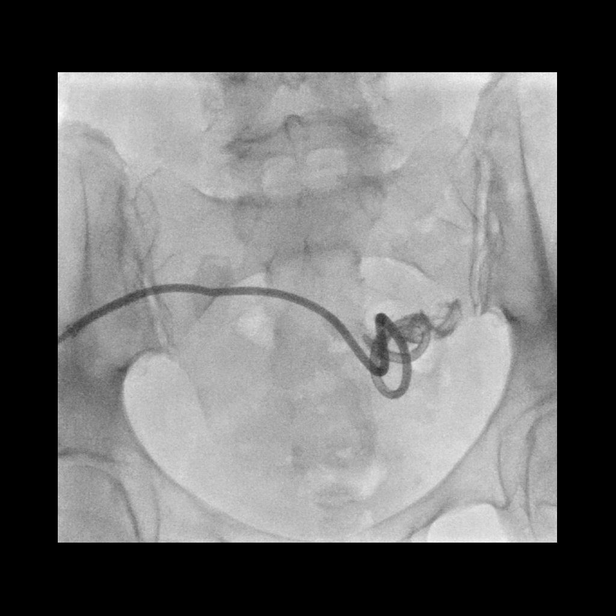
[frame 14/16]
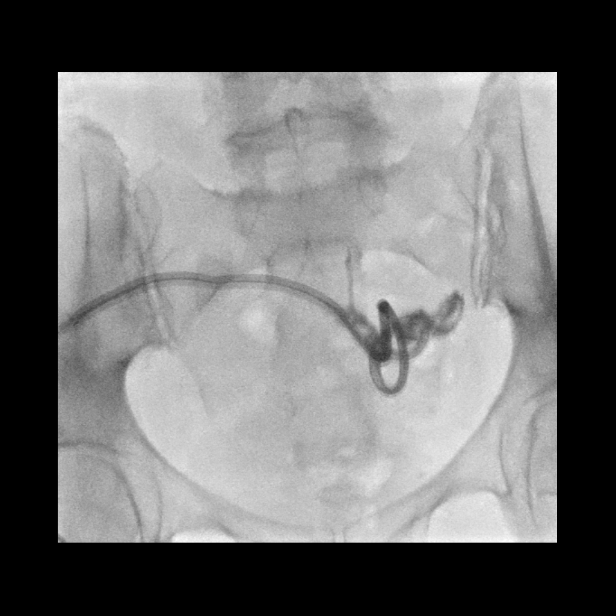
[frame 15/16]
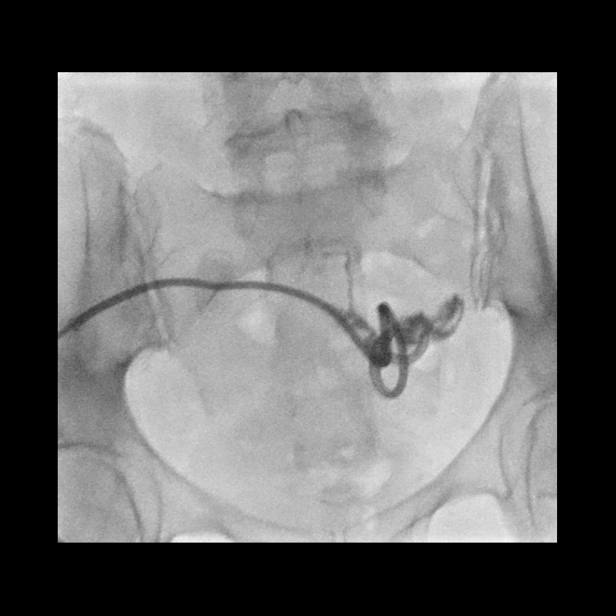

[Series 3: one shot · 5 of 5 slices shown (2 of 2)]
[im 1/5]
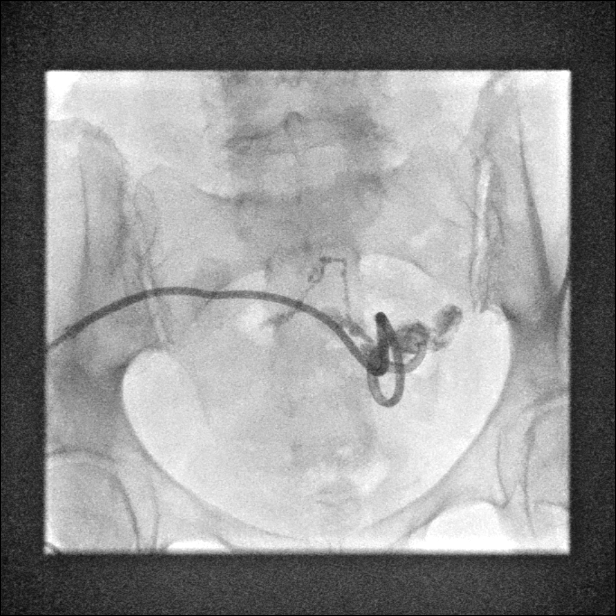
[im 2/5]
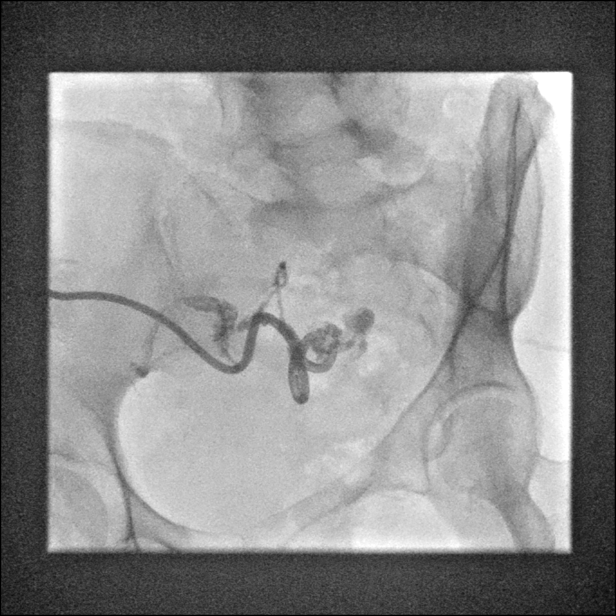
[im 3/5]
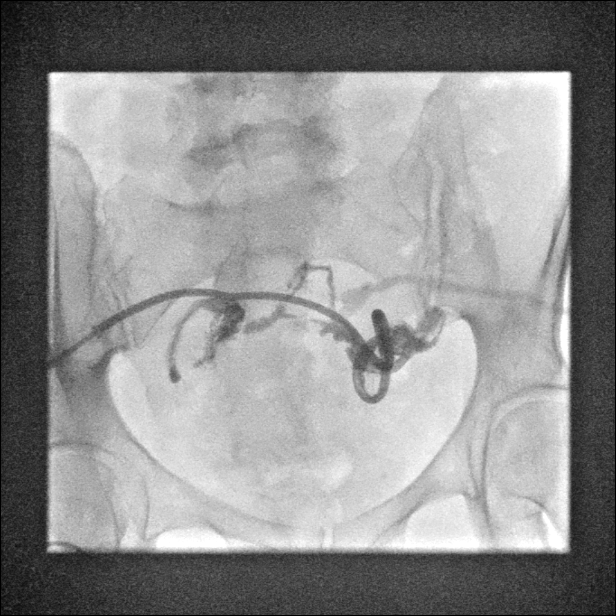
[im 4/5]
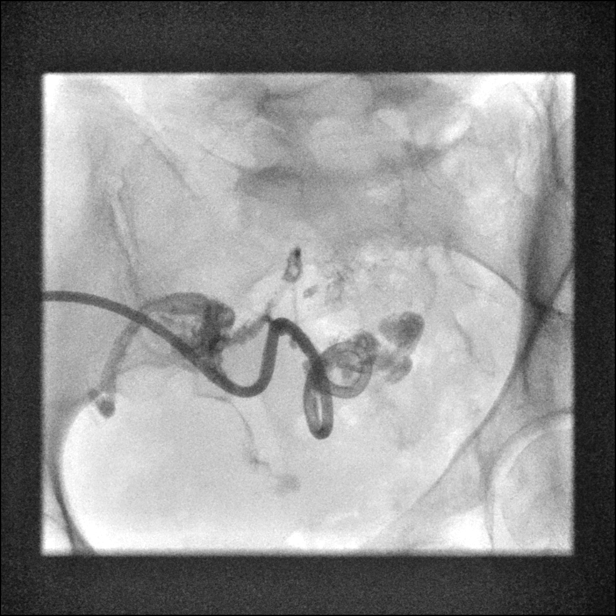
[im 5/5]
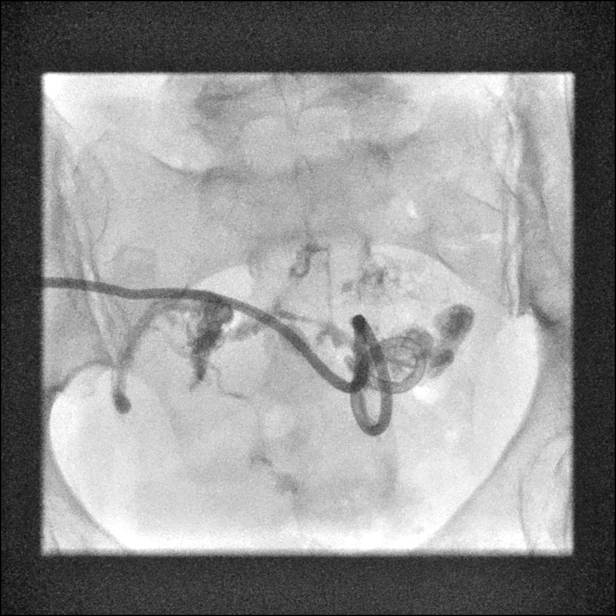

[10 of 10 positions shown; findings below may reference images not displayed]

Subsequent abdominal CT performed 07/02/2017 demonstrates resolution
of the pelvic abscess, however subsequent percutaneous drainage
catheter injections, most recently performed on 07/31/2017,
demonstrated a persistent fistulous connection between the
decompressed abscess cavity in the adjacent intestines.

Patient returns today to the [REDACTED]
for catheter evaluation and management.

The patient is not flushing the percutaneous drainage catheter and
reports little to no output from the percutaneous drainage catheter
for the past several days. Patient is not currently taking
antibiotics though denies fever, chills or worsening abdominal pain.

Note, patient also underwent a right trans gluteal approach
percutaneous drainage catheter placement on 04/30/2017, however this
was subsequent removed on 05/20/2017.

EXAM:
ABSCESS INJECTION
CT abdomen pelvis -
06/18/2017; CT-guided percutaneous drainage catheter
placement-06/10/2017

CONTRAST:  15 cc [id number], administered via the percutaneous
drainage catheter

FLUOROSCOPY TIME:  1 minute, 12 seconds (37 mGy)
A preprocedural spot fluoroscopic image was obtained of the pelvis
and existing percutaneous drainage catheter

Multiple spot fluoroscopic and radiographic images were obtained
following the injection of a small amount of contrast via the
existing percutaneous drainage catheter.

Images reviewed in the procedure was terminated. The drainage
catheter was reconnected to a JP bulb pre dressings placed. The
patient tolerated the procedure well without immediate
postprocedural complication.
FINDINGS: Preprocedural spot fluoroscopic image demonstrates unchanged
positioning of percutaneous drainage catheter with end coiled and
locked overlying the left mid hemi pelvis. Contrast injection
demonstrates decompression of an abscess cavity with persistent
communication to the adjacent intestines.
IMPRESSION: Persistent fistulous connection between decompressed abscess cavity
in the adjacent intestines.

PLAN:
Per patient report, the patient was seen in consultation by [place].
Seema Arreola week and if today's examination remained positive for
a fistula, the patient is to undergo definitive surgical resection.

As such, the patient was encouraged to maintain her current
management of the percutaneous drainage catheter but may otherwise
follow-up with the interventional drain clinic on a p.r.n. basis.

## 2017-08-13 NOTE — Progress Notes (Signed)
Patient ID: Kim Snow, female   DOB: 1960-09-03, 57 y.o.   MRN: [id number]        Chief Complaint: Percutaneous drainage catheter evaluation and management  Referring Physician(s): Rodman Pickle  History of Present Illness: Kim Snow is a 57 y.o. female with past female history significant for asthma and hypertension who underwent partial small bowel resection and lysis of adhesions related to small bowel perforation complicated by development of a postoperative abscess, post CT-guided percutaneous drainage catheter placement on 06/10/2017.  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  Note, patient also underwent a right transgluteal approach percutaneous drainage catheter placement on 04/30/2017, however this was subsequently removed on 05/20/2017.  Patient returns to the interventional radiology drain clinic for drainage catheter evaluation and management.  She is unaccompanied and serves as her own historian.  Patient reports little to no output from the percutaneous drainage catheter for the past several days.  The patient is no longer flushing the percutaneous drainage catheter.  The patient is not currently taking antibiotics though denies worsening abdominal pain, fever or chills.  Patient states that she saw Dr. Sheliah Hatch last week and per her report, if today's drainage catheter injection demonstrates a persistent fistula, she will be scheduled for definitive surgical repair.   Past Medical History:  Diagnosis Date  . Anemia   . Anxiety   . Asthma   . Depression   . GERD (gastroesophageal reflux disease)   . Hypertension   . Insomnia   . Migraine     Past Surgical History:  Procedure  Laterality Date  . CESAREAN SECTION    . CHOLECYSTECTOMY    . IR RADIOLOGIST EVAL & MGMT  05/20/2017  . IR RADIOLOGIST EVAL & MGMT  06/18/2017  . IR RADIOLOGIST EVAL & MGMT  07/02/2017  . IR RADIOLOGIST EVAL & MGMT  07/16/2017  . IR RADIOLOGIST EVAL & MGMT  07/31/2017  . IR RADIOLOGIST EVAL & MGMT  08/13/2017  . KNEE ARTHROSCOPY    . LAPAROSCOPY N/A 05/22/2017   Procedure: LAPAROSCOPY DIAGNOSTIC, LYSIS OF ADHESIONS, DRAINAGE OF ABCESS, RIGID PROCTOSCOPY, SMALL BOWEL RESECTION X2;  Surgeon: Kinsinger, De Blanch, MD;  Location: [facility];  Service: General;  Laterality: N/A;    Allergies: Patient has no known allergies.  Medications: Prior to Admission medications   Medication Sig Start Date End Date Taking? Authorizing Provider  acetaminophen (TYLENOL) 325 MG tablet Take 2 tablets (650 mg total) by mouth every 6 (six) hours as needed for mild pain (or temp > 100). Patient not taking: Reported on 06/10/2017 05/11/17   Berna Bue, MD  ALPRAZolam Prudy Feeler) 1 MG tablet Take 1 mg by mouth at bedtime as needed for sleep.  02/11/17   [provider]  CALCIUM PO Take 1,000 mg by mouth daily.    [provider]  Cyanocobalamin (VITAMIN B 12 PO) Take 1 tablet by mouth daily.    [provider]  docusate sodium (COLACE) 100 MG capsule Take 1 capsule (100 mg total) by mouth 2 (two) times daily. Patient not taking: Reported on 06/10/2017 05/11/17   Berna Bue, MD  losartan-hydrochlorothiazide (HYZAAR) 100-25 MG tablet Take 1 tablet by mouth daily. 03/11/17   [provider]  meloxicam (MOBIC) 15 MG tablet Take 15 mg by mouth daily at 12 noon. 04/21/17  [provider]  omeprazole (PRILOSEC) 40 MG capsule Take 40 mg by mouth daily. 03/28/17   [provider]  ondansetron (ZOFRAN-ODT) 4 MG disintegrating tablet Take 1 tablet (4 mg total) by mouth every 6 (six) hours as needed for nausea or vomiting. 06/12/17   Meuth, Brooke A, PA-C  oxyCODONE (OXY  IR/ROXICODONE) 5 MG immediate release tablet Take 1 tablet (5 mg total) by mouth every 6 (six) hours as needed for severe pain. 06/12/17   Meuth, Brooke A, PA-C  Polysaccharide Iron Complex (IFEREX 150 PO) Take 150 mg by mouth daily.    [provider]     No family history on file.  Social History   Socioeconomic History  . Marital status: Married    Spouse name: Not on file  . Number of children: Not on file  . Years of education: Not on file  . Highest education level: Not on file  Occupational History  . Not on file  Social Needs  . Financial resource strain: Not on file  . Food insecurity:    Worry: Not on file    Inability: Not on file  . Transportation needs:    Medical: Not on file    Non-medical: Not on file  Tobacco Use  . Smoking status: Never Smoker  . Smokeless tobacco: Never Used  Substance and Sexual Activity  . Alcohol use: Yes    Comment: social  . Drug use: Never  . Sexual activity: Not on file  Lifestyle  . Physical activity:    Days per week: Not on file    Minutes per session: Not on file  . Stress: Not on file  Relationships  . Social connections:    Talks on phone: Not on file    Gets together: Not on file    Attends religious service: Not on file    Active member of club or organization: Not on file    Attends meetings of clubs or organizations: Not on file    Relationship status: Not on file  Other Topics Concern  . Not on file  Social History Narrative  . Not on file    ECOG Status: 1 - Symptomatic but completely ambulatory  Review of Systems: A 12 point ROS discussed and pertinent positives are indicated in the HPI above.  All other systems are negative.  Review of Systems  Vital Signs: BP (!) 112/51   Pulse 86   Temp 97.9 F (36.6 C)   SpO2 99%   Physical Exam   Imaging: Dg Sinus/fist Tube Chk-non Gi  Result Date: 08/13/2017 CLINICAL DATA:  History of partial small bowel resection and lysis of adhesions related  to small bowel perforation, post CT-guided percutaneous drainage catheter placement for postoperative intra-abdominal abscess on 06/10/2017. Subsequent abdominal CT performed 07/02/2017 demonstrates resolution of the pelvic abscess, however subsequent percutaneous drainage catheter injections, most recently performed on 07/31/2017, demonstrated a persistent fistulous connection between the decompressed abscess cavity in the adjacent intestines. Patient returns today to the interventional radiology drain Clinic for catheter evaluation and management. The patient is not flushing the percutaneous drainage catheter and reports little to no output from the percutaneous drainage catheter for the past several days. Patient is not currently taking antibiotics though denies fever, chills or worsening abdominal pain. Note, patient also underwent a right trans gluteal approach percutaneous drainage catheter placement on 04/30/2017, however this was subsequent removed on 05/20/2017. EXAM: ABSCESS INJECTION COMPARISON:  Multiple previous fluoroscopic guided drainage catheter injections, most recently  on 07/31/2017; CT abdomen pelvis - 06/18/2017; CT-guided percutaneous drainage catheter placement-06/10/2017 CONTRAST:  15 cc Isovue-300, administered via the percutaneous drainage catheter FLUOROSCOPY TIME:  1 minute, 12 seconds (37 mGy) TECHNIQUE: The patient was positioned supine on the fluoroscopy table. A preprocedural spot fluoroscopic image was obtained of the pelvis and existing percutaneous drainage catheter Multiple spot fluoroscopic and radiographic images were obtained following the injection of a small amount of contrast via the existing percutaneous drainage catheter. Images reviewed in the procedure was terminated. The drainage catheter was reconnected to a JP bulb pre dressings placed. The patient tolerated the procedure well without immediate postprocedural complication. FINDINGS: Preprocedural spot fluoroscopic  image demonstrates unchanged positioning of percutaneous drainage catheter with end coiled and locked overlying the left mid hemi pelvis. Contrast injection demonstrates decompression of an abscess cavity with persistent communication to the adjacent intestines. 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 PLAN: Per patient report, the patient was seen in consultation by Dr. Sheliah HatchKinsinger last week and if today's examination remained positive for a fistula, the patient is to undergo definitive surgical resection. As such, the patient was encouraged to maintain her current management of the percutaneous drainage catheter but may otherwise follow-up with the interventional drain clinic on a p.r.n. basis. Electronically Signed   By: Simonne ComeJohn  Doloros Kwolek M.D.   On: 08/13/2017 11:37   Dg Sinus/fist Tube Chk-non Gi  Result Date: 07/31/2017 INDICATION: Lower abdominal abscess drain with fistula EXAM: ABSCESS DRAIN INJECTION FLUOROSCOPY TIME:  18 seconds.  Ten mGy. MEDICATIONS: The patient is currently admitted to the hospital and receiving intravenous antibiotics. The antibiotics were administered within an appropriate time frame prior to the initiation of the procedure. ANESTHESIA/SEDATION: None COMPLICATIONS: None immediate. PROCEDURE: Informed written consent was obtained from the patient after a thorough discussion of the procedural risks, benefits and alternatives. All questions were addressed. Maximal Sterile Barrier Technique was utilized including caps, mask, sterile gowns, sterile gloves, sterile drape, hand hygiene and skin antiseptic. A timeout was performed prior to the initiation of the procedure. A scout image was performed. Subsequently, contrast was injected and imaging was performed. The catheter was then flushed and attached to a JP bulb. FINDINGS: The abscess cavity remains decompressed. 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 Contrast  fills the fistulous tract and adjacent sigmoid colon. 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 Electronically Signed   By: Jolaine ClickArthur  Hoss M.D.   On: 07/31/2017 10:58   Dg Sinus/fist Tube Chk-non Gi  Result Date: 07/16/2017 CLINICAL DATA:  History of partial small bowel resection and lysis of adhesions related to small bowel perforation post CT-guided percutaneous drainage catheter placement for postoperative intra-abdominal abscess on 06/10/2017. 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 Patient returns to the Interventional Radiology drain Clinic for drainage catheter evaluation and management The patient is not flushing her percutaneous drainage catheter. Patient reports little to no output from the percutaneous drainage catheter for the past several days. Patient continues to take her prescribed course of antibiotics. Note, patient also underwent a right trans gluteal approach percutaneous drainage catheter placement on 05/06/2017 however this was subsequently removed on 05/20/2017. EXAM: ABSCESS INJECTION COMPARISON:  Drainage catheter injection - 07/02/2017; 06/18/2017; CT abdomen and pelvis - 07/02/2017; 06/18/2017; 05/20/2017; CT-guided percutaneous drainage catheter placement - 06/10/2017 CONTRAST:  20 cc Omnipaque 300-administered via the existing percutaneous drainage catheter FLUOROSCOPY TIME:  3 minutes, 6 seconds (148 mGy)  TECHNIQUE: The patient was positioned supine on the fluoroscopy table. A preprocedural spot fluoroscopic image was obtained of the lower pelvis and existing percutaneous drainage catheter Multiple spot fluoroscopic and radiographic images were obtained in various obliquities following the injection of a small amount of contrast via the existing percutaneous drainage catheter. Images reviewed and discussed with the patient. The drainage catheter was flushed with a  small amount of saline and reconnected to a gravity bag. A dressing was placed. The patient tolerated the procedure well without immediate postprocedural complication. FINDINGS: Preprocedural spot fluoroscopic image demonstrates unchanged positioning left lower abdominal/pelvic drainage catheter. Contrast injection demonstrates persistent communication between the decompressed abscess cavity and the adjacent intestines. IMPRESSION: Persistent fistulous connection between the left lower abdominal/pelvic drainage catheter and the adjacent intestines. PLAN: - The patient was again instructed not to flush the percutaneous drainage catheter. - The patient was instructed to maintain diligent records regarding daily drainage catheter output. - The patient return to the interventional radiology drain clinic in 2 weeks for repeat drainage catheter injection. If fistula persists the time of this injection, the patient may require definitive surgical repair. Electronically Signed   By: Simonne Come M.D.   On: 07/16/2017 12:30   Ir Radiologist Eval & Mgmt  Result Date: 08/13/2017 Please refer to notes tab for details about interventional procedure. (Op Note)  Ir Radiologist Eval & Mgmt  Result Date: 07/31/2017 Please refer to notes tab for details about interventional procedure. (Op Note)  Ir Radiologist Eval & Mgmt  Result Date: 07/16/2017 Please refer to notes tab for details about interventional procedure. (Op Note)   Labs:  CBC: Recent Labs    06/09/17 1710 06/10/17 0131 06/10/17 0853 06/11/17 0701  WBC 18.9* 15.0* 14.3* 10.0  HGB 8.0* 7.4* 6.8* 8.0*  HCT 24.9* 22.4* 20.8* 24.8*  PLT 793* 628* 627* 569*    COAGS: Recent Labs    05/06/17 0539 06/10/17 1142  INR 1.04 1.04  APTT 29  --     BMP: Recent Labs    05/29/17 0416 06/09/17 1710 06/10/17 0853 06/11/17 0701 06/12/17 0644  NA 141 134*  --  138 139  K 4.0 3.8  --  3.8 4.1  CL 102 97*  --  104 103  CO2 28 24  --  25 26    GLUCOSE 96 132*  --  107* 116*  BUN 5* 29*  --  9 5*  CALCIUM 8.8* 9.2  --  8.4* 8.5*  CREATININE 0.81 1.22* 0.94 0.64 0.72  GFRNONAA >60 48* >60 >60 >60  GFRAA >60 56* >60 >60 >60    LIVER FUNCTION TESTS: Recent Labs    05/21/17 1432 06/10/17 0131 06/11/17 0701 06/12/17 0644  BILITOT 0.3 0.6 0.6 0.3  AST 23 31 90* 39  ALT 22 21 49 42  ALKPHOS 161* 255* 364* 336*  PROT 7.9 6.6 6.0* 6.1*  ALBUMIN 3.4* 2.1* 1.8* 1.8*    TUMOR MARKERS: No results for input(s): AFPTM, CEA, CA199, CHROMGRNA in the last 8760 hours.  Assessment and Plan:  Kim Snow is a 57 y.o. female with past female history significant for asthma and hypertension who underwent partial small bowel resection and lysis of adhesions related to small bowel perforation complicated by development of a postoperative abscess post CT-guided percutaneous drainage catheter placement on 06/10/2017.  Subsequent abdominal CT performed 07/06/2017 demonstrated resolution of the abscess cavity, however subsequent percutaneous drainage catheter injections, most recently performed on 620/2019, demonstrated persistent fistulous connection between the decompressed  abscess cavity and the adjacent intestines.  Unfortunately, today's drainage catheter injection demonstrates a tiny though persistent fistulous connection between the decompressed abscess cavity adjacent intestines.  Per patient report, if today's injection demonstrated persistent fistula, the patient will be scheduled for definitive surgical repair.  As such, patient was instructed to maintain her current regiment caring for her percutaneous drainage catheter until the time of her definitive surgical repair, and may otherwise return to the interventional radiology drain clinic on a PRN basis.  A copy of this report was sent to the requesting provider on this date.  Electronically Signed: Simonne Come 08/13/2017, 12:33 PM   I spent a total of 10 Minutes in face to  face in clinical consultation, greater than 50% of which was counseling/coordinating care for percutaneous drainage catheter evaluation and management.

## 2017-08-18 ENCOUNTER — Other Ambulatory Visit: Payer: Self-pay | Admitting: General Surgery

## 2017-08-18 DIAGNOSIS — Z87898 Personal history of other specified conditions: Secondary | ICD-10-CM

## 2017-08-26 ENCOUNTER — Other Ambulatory Visit: Payer: Self-pay | Admitting: Physician Assistant

## 2017-08-26 ENCOUNTER — Ambulatory Visit
Admission: RE | Admit: 2017-08-26 | Discharge: 2017-08-26 | Disposition: A | Discharge status: Home / Self Care | Payer: BC Managed Care – PPO | Source: Ambulatory Visit | Attending: General Surgery | Admitting: General Surgery

## 2017-08-26 ENCOUNTER — Telehealth: Payer: Self-pay | Admitting: Physician Assistant

## 2017-08-26 DIAGNOSIS — Z87898 Personal history of other specified conditions: Secondary | ICD-10-CM

## 2017-08-26 DIAGNOSIS — K651 Peritoneal abscess: Secondary | ICD-10-CM

## 2017-08-26 HISTORY — PX: IR RADIOLOGIST EVAL & MGMT: IMG5224

## 2017-08-26 IMAGING — RF DG SINUS / FISTULA TRACT / ABSCESSOGRAM
7 series · 14 of 20 positions shown · IV contrast (omnipaque)
Comparison: none

INDICATION: 56-year-old with complex medical history including partial small
bowel resection and lysis of adhesion for perforated small bowel.
Subsequent CT-guided drain placements. Most recent drain was placed
on 06/10/2017. Minimal output from drain in the past 2 weeks.
Previous drain injection was concerning for a bowel fistula. Patient
presents for follow-up drain injection.

EXAM:
DRAIN INJECTION WITH FLUOROSCOPY
MEDICATIONS:
None
ANESTHESIA/SEDATION:
COMPLICATIONS:
None immediate.
PROCEDURE:
Patient was placed supine on the fluoroscopic table. The drain was
injected with 10 mL Omnipaque 300. Aspirated catheter at end of
procedure.

[Series 1: one shot · 1 of 1 slices shown (1 of 3)]
[im 1/1]
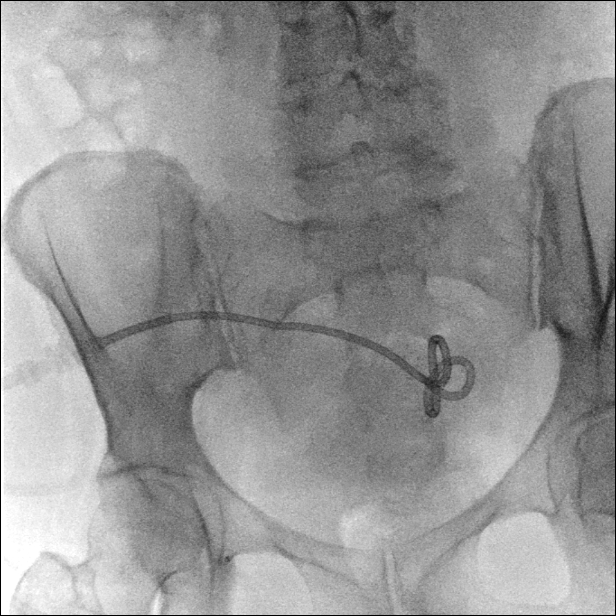

[Series 2: sequence · 2 of 73 frames shown (1 of 4)]
[frame 11/73]
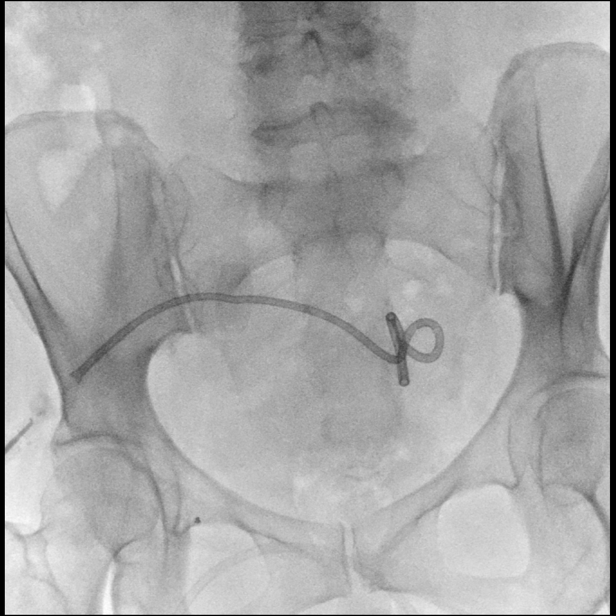
[frame 37/73]
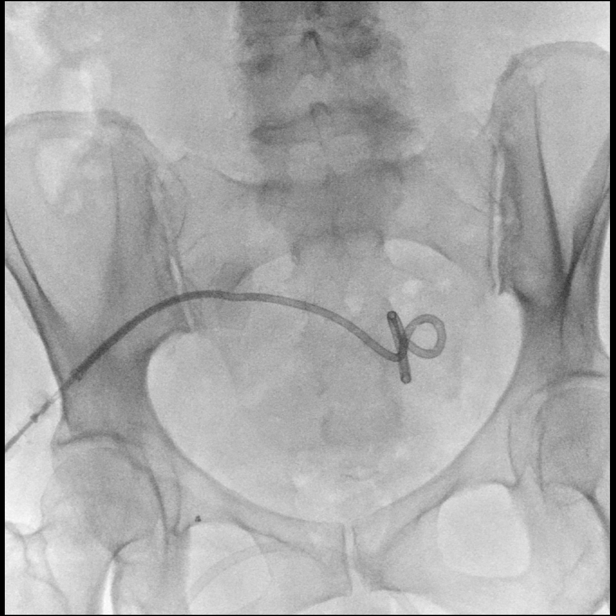

[Series 3: sequence · 3 of 19 frames shown (2 of 4)]
[frame 3/19]
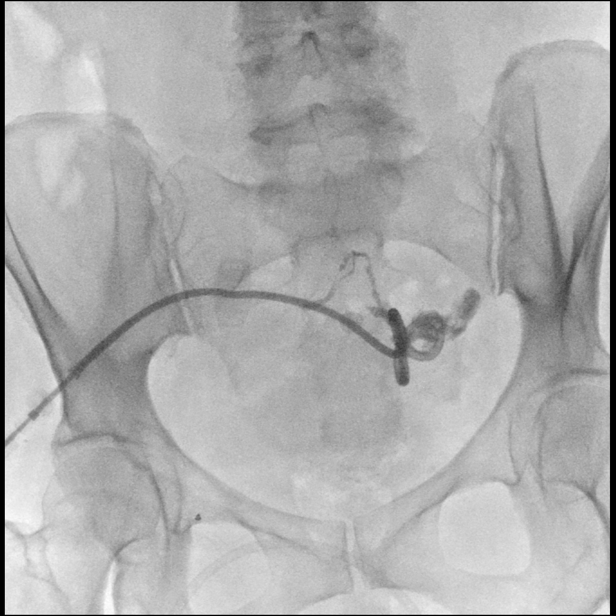
[frame 10/19]
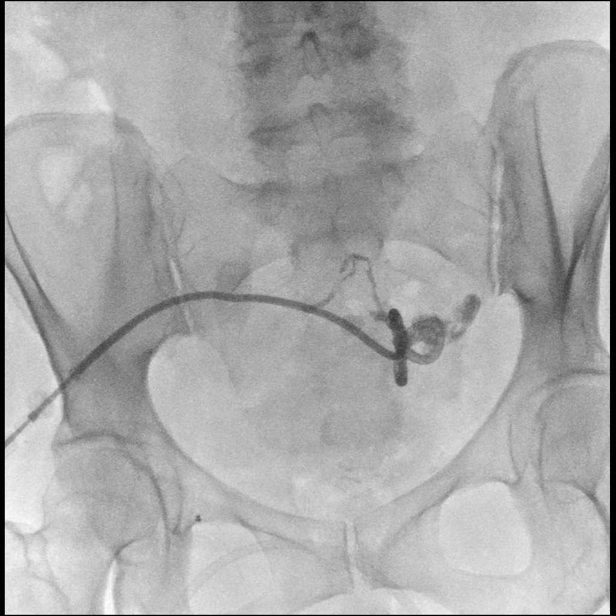
[frame 11/19]
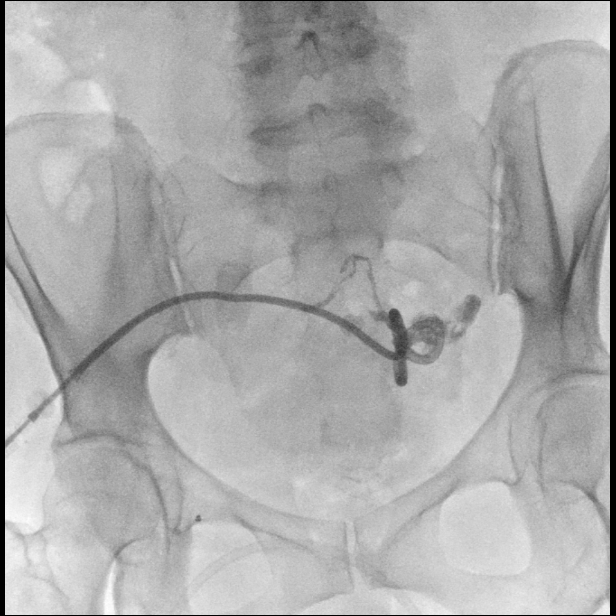

[Series 4: one shot · 0.15mm/px · 1 of 1 slices shown (2 of 3)]
[im 1/1]
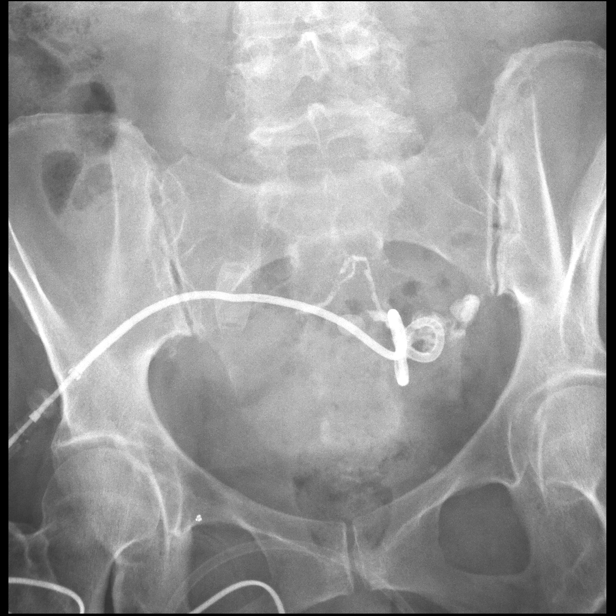

[Series 5: sequence · 3 of 31 frames shown (3 of 4)]
[frame 5/31]
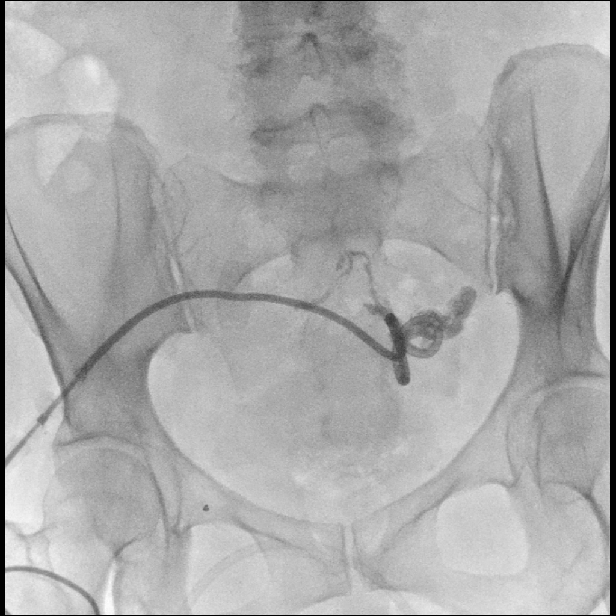
[frame 23/31]
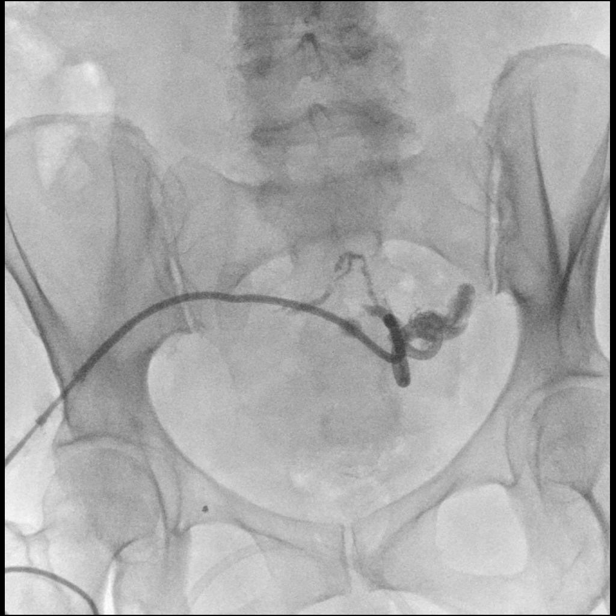
[frame 27/31]
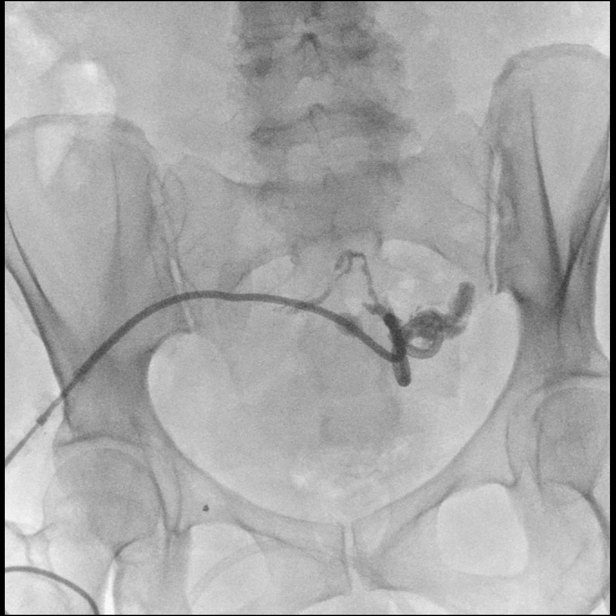

[Series 6: one shot · 0.15mm/px · 1 of 2 slices shown (3 of 3)]
[im 2/2]
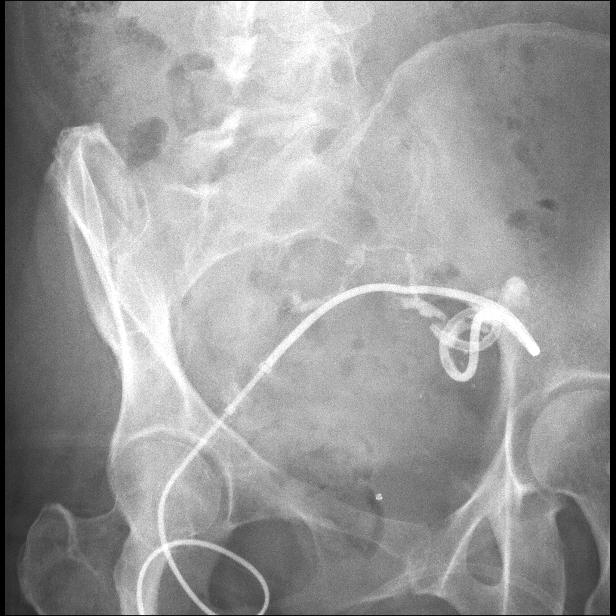

[Series 7: sequence · 3 of 23 frames shown (4 of 4)]
[frame 4/23]
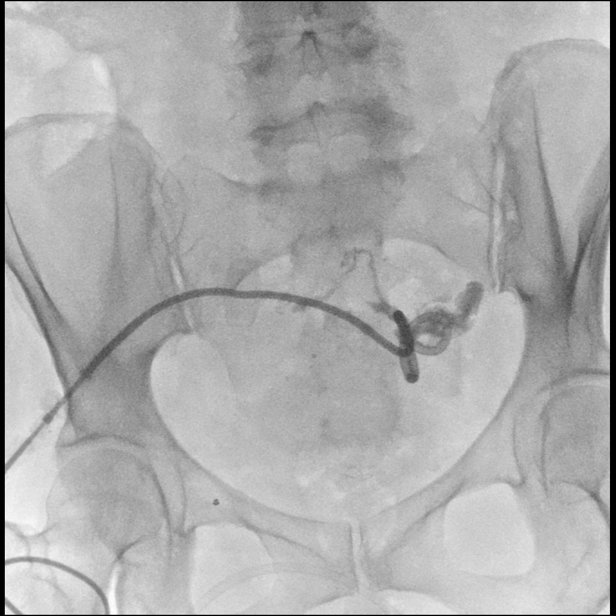
[frame 12/23]
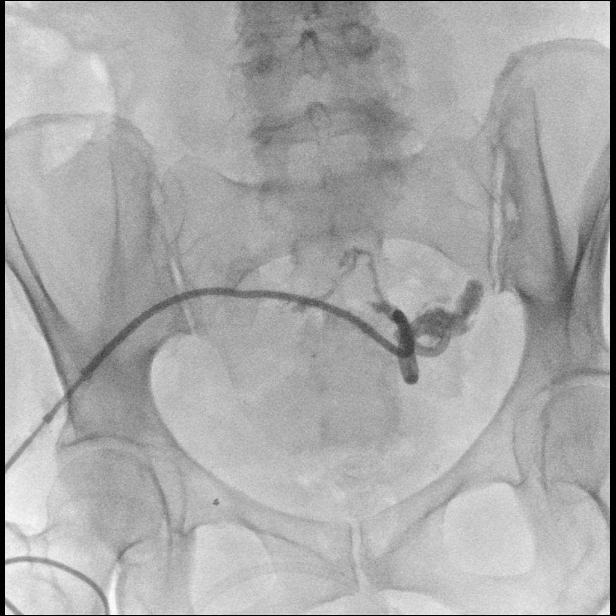
[frame 20/23]
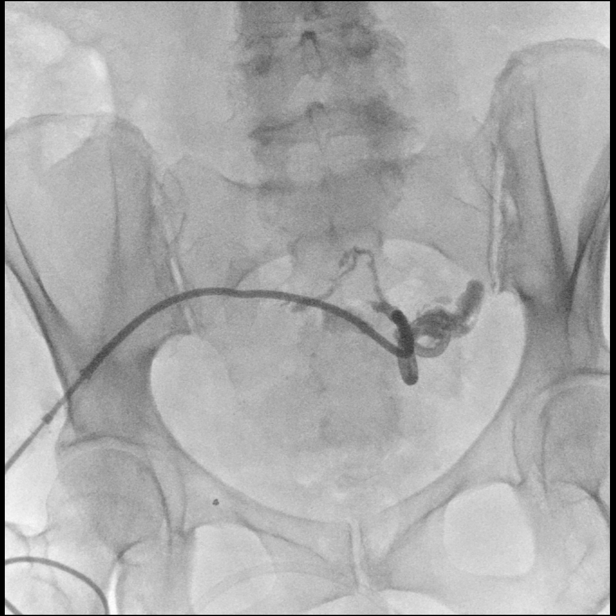

[14 of 20 positions shown; findings below may reference images not displayed]

FINDINGS: Drain remains in a stable position on the left side of the pelvis.
There is filling of a small collection adjacent to the drain.
Contrast extends towards the midline with a small serpiginous tract.
The contrast did not move after the injection and did not appear to
be within bowel.
IMPRESSION: Contrast fills a small collection adjacent to the drain. The
configuration is more compatible with adnexa and fallopian tube
rather than bowel. As a result, a CT scan of the abdomen and pelvis
was obtained. There was no definite contrast within the bowel on the
CT. Therefore, the drain was removed.

## 2017-08-26 IMAGING — CT CT ABD-PELV W/ CM
2 of 4 series · 14 of 46 positions shown, 16 images · IV contrast (iopamidol)
Comparison: Drain injection 08/26/2017. CT of the abdomen pelvis
07/02/2017

CLINICAL DATA: 56-year-old with a complex medical history including
small bowel perforation and lysis of adhesions. Patient has had
percutaneous drains and the most recent drain was placed on
06/10/2017. Minimal output from the drain in the past 2 weeks but
previous drain injection raised concern for a bowel fistula. Patient
had a drain injection prior to this procedure. The drain injection
suggested contrast within the fallopian tubes rather than bowel. CT
was performed in order to evaluate for a bowel fistula.

EXAM:
CT ABDOMEN AND PELVIS WITH CONTRAST
TECHNIQUE: Multidetector CT imaging of the abdomen and pelvis was performed
using the standard protocol following bolus administration of
intravenous contrast.
CONTRAST:  100mL 45HVPI-O11 IOPAMIDOL (45HVPI-O11) INJECTION 61%

[Series 2: abd pelvis 2.00 br40 s3 cor · coronal · 0.76mm/px · 3 of 164 slices shown]
[im 55/164  soft-tissue]
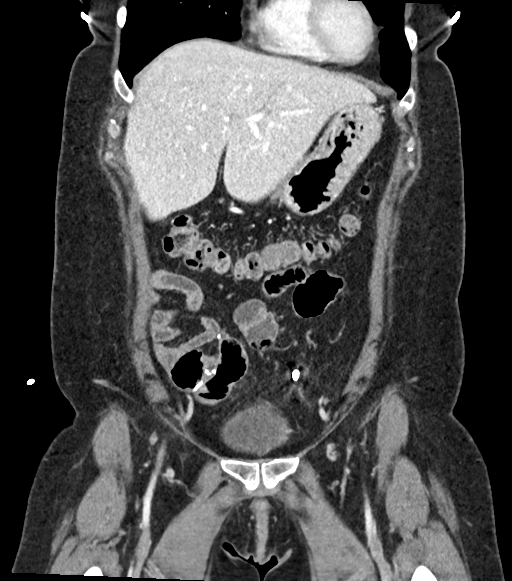
[im 73/164  soft-tissue]
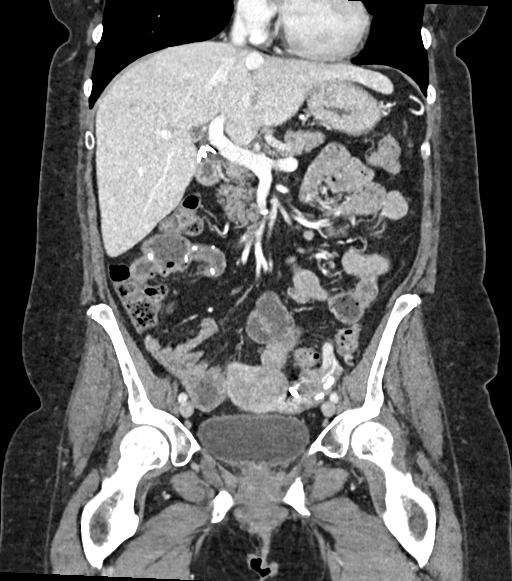
[im 91/164  soft-tissue]
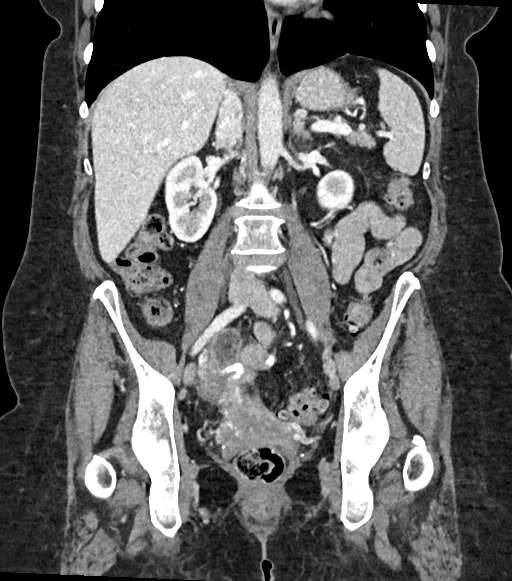

[Series 4: abd pelvis 5.00 br40 s3 ax · axial · 0.73mm/px · z∈[+986,+1366]mm · 11 of 88 slices shown, 13 images]
[im 8/88  soft-tissue]
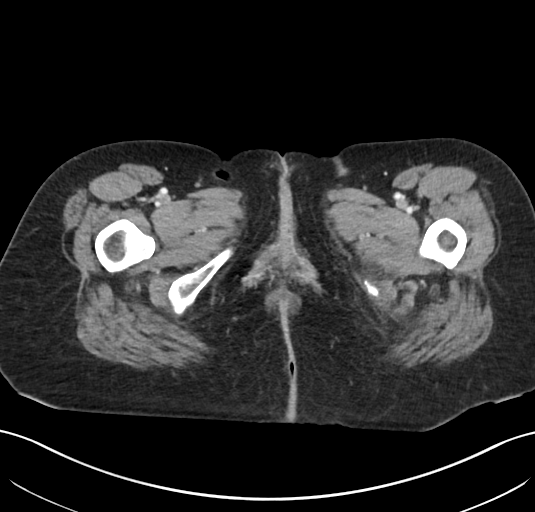
[im 8/88  bone]
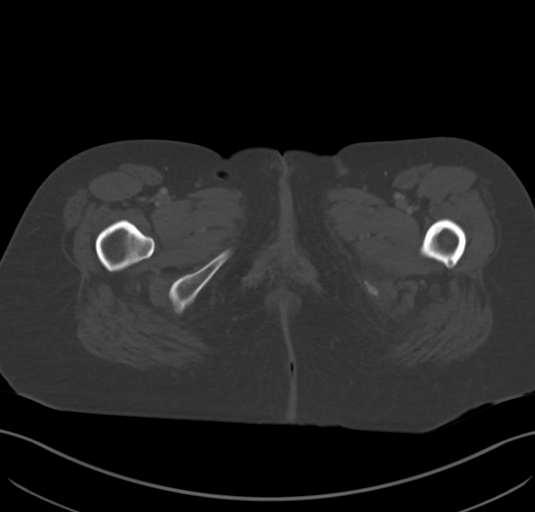
[im 16/88  soft-tissue]
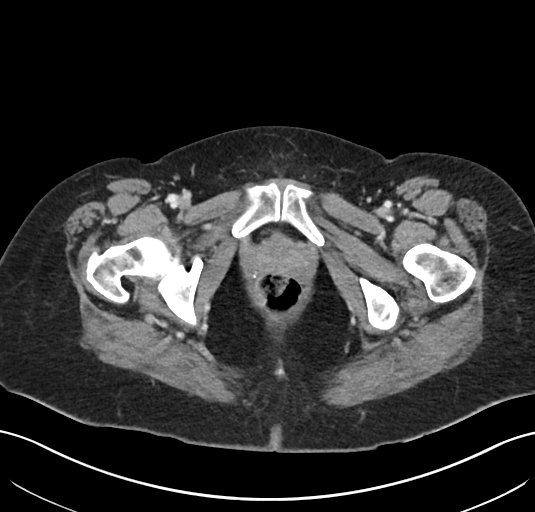
[im 23/88  soft-tissue]
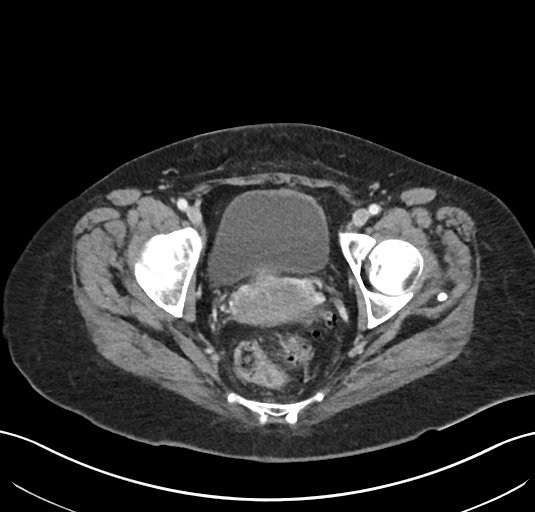
[im 31/88  soft-tissue]
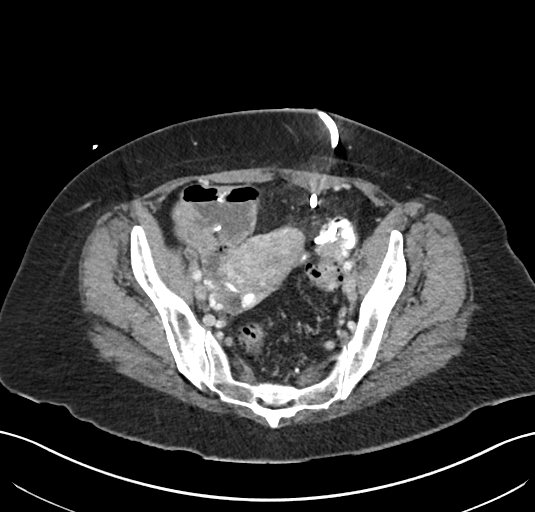
[im 38/88  soft-tissue]
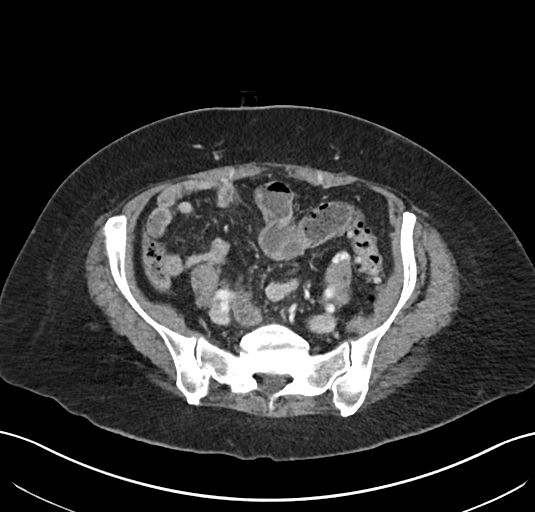
[im 46/88  soft-tissue]
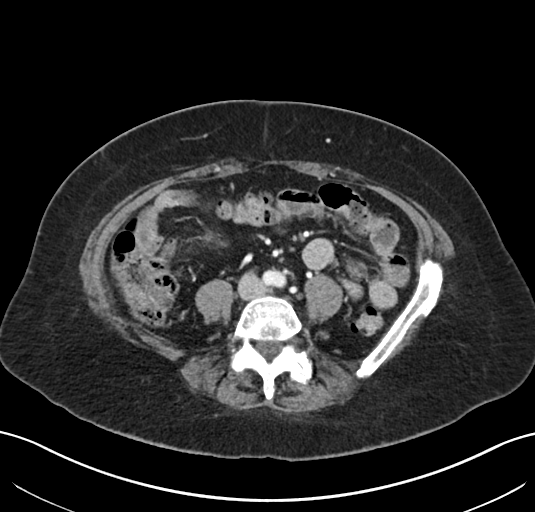
[im 53/88  soft-tissue]
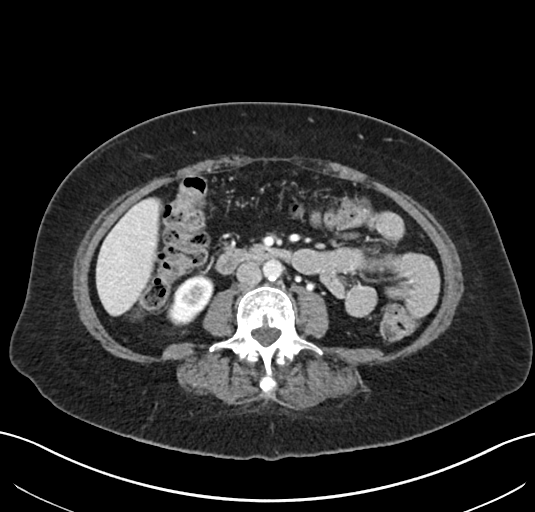
[im 61/88  soft-tissue]
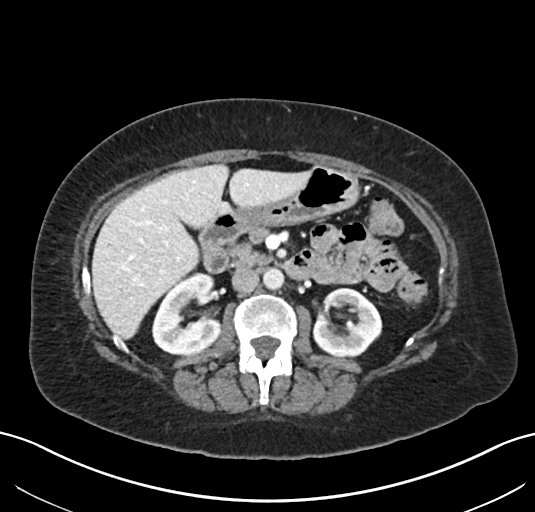
[im 69/88  soft-tissue]
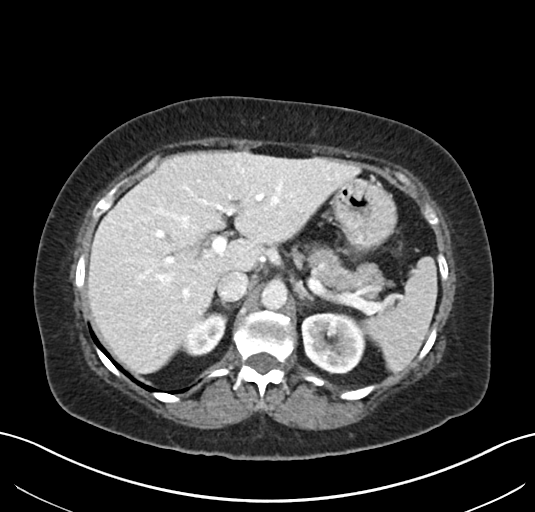
[im 69/88  bone]
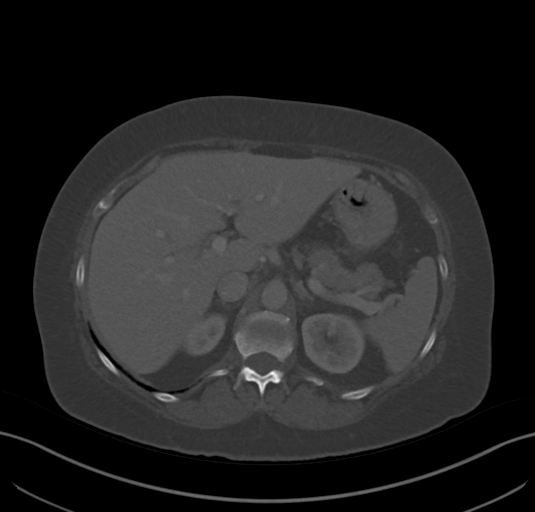
[im 76/88  soft-tissue]
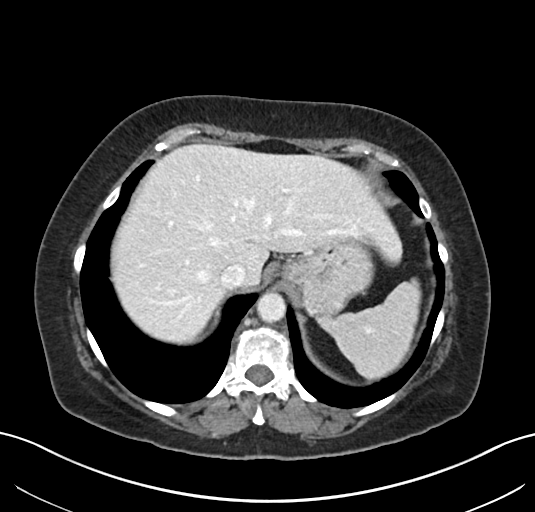
[im 84/88  soft-tissue]
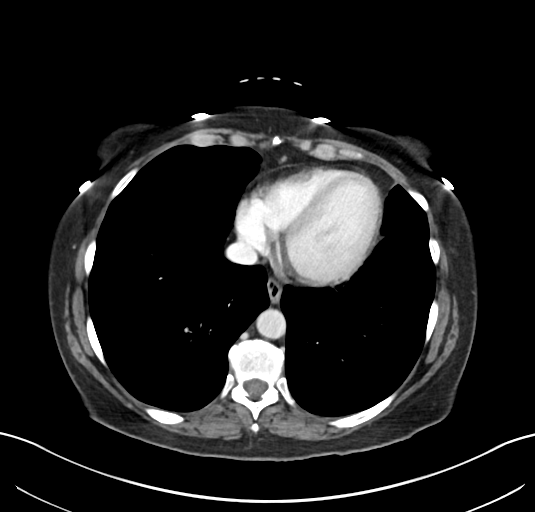

[14 of 46 positions shown; findings below may reference images not displayed]

FINDINGS: Lower chest: Lung bases are clear.

Hepatobiliary: Gallbladder has been removed. Normal appearance of
the liver without biliary dilatation. Portal venous system is
patent.

Pancreas: Normal appearance of the pancreas without inflammation or
duct dilatation.

Spleen: Normal in size without focal abnormality.

Adrenals/Urinary Tract: Normal appearance of the adrenal glands.
Normal appearance of the urinary bladder. Normal appearance of both
kidneys without hydronephrosis. No suspicious renal lesions.

Stomach/Bowel: Normal appearance of the stomach. There is a small
bowel anastomosis in the right abdomen without inflammatory changes
or obstruction. Small bowel anastomosis in the right lower abdomen
is again noted and no evidence for obstruction or inflammatory
changes in this area. There is no evidence for injected contrast
within the small or large bowel. Multiple diverticula involving the
descending colon and sigmoid colon. There is no contrast in the
rectum.

Vascular/Lymphatic: No significant lymphadenopathy in the abdomen or
pelvis. Atherosclerotic calcifications in the abdominal aorta
without aneurysm.

Reproductive: Uterus is mildly nodular. The percutaneous drain is
adjacent to the left adnexa and there is high-density contrast in
the region of the left ovary/adnexa. High-density material in the
region of the right adnexa probably related to the recent drain
injection.

Other: Stable position of the percutaneous drain in the left side of
the pelvis. The drain is adjacent to the left adnexa. There is no
significant abscess collection present. There is a small amount of
soft tissue along the right posterior aspect of the uterus which
probably contains some injected contrast. This may represent a tiny
residual cavity associated with the previous abscess. There was a
similar finding in this area on 07/02/2017 but this collection or
area has gotten smaller. No free fluid. Negative for free air.

Musculoskeletal: Degenerate facet disease in the lower lumbar spine.
There is a new subtle compression deformity along the superior
endplate of T12. No significant bone retropulsion at this level.
Again noted is minimal anterolisthesis at L3-L4.
IMPRESSION: No evidence for a bowel fistula. The drain contrast is located
within the left adnexa and possibly even extending into the right
adnexa.

No significant abscess collection. Small irregular soft tissue along
the right side of the pelvis probably related to the old abscess
collection.

Mild compression fracture involving the superior endplate of T12.
This is new since June 2017.

Intra-abdominal postsurgical changes.

## 2017-08-26 MED ORDER — IOPAMIDOL (ISOVUE-300) INJECTION 61%
100.0000 mL | Freq: Once | INTRAVENOUS | Status: AC | PRN
  Administered 2017-08-26: 100 mL via INTRAVENOUS

## 2017-08-26 NOTE — Progress Notes (Signed)
Chief Complaint: Drain injection  Referring Physician(s): Rodman Pickle  Supervising Physician: Richarda Overlie  History of Present Illness: Kim Snow is a 57 y.o. female who underwent partial small bowel resection and lysis of adhesions related to small bowel perforation complicated by development of a postoperative abscess.  She underwent a CT-guided percutaneous drainage catheter placement on 06/10/2017.  Subsequent abdominal CT performed 07/06/2017 that demonstrated resolution of the abscess cavity, however subsequent percutaneous drainage catheter injections, most recently performed on 08/13/2017, demonstrated persistent fistulous connection between the decompressed abscess cavity and the adjacent intestines.  She is here today for drainage catheter evaluation and management.    Patient reports little to no output from the percutaneous drainage catheter for the past several days.    She is not flushing the catheter as directed previously.    She is not currently taking antibiotics.  She denies worsening abdominal pain, fever or chills.  Patient states that she saw Dr. Sheliah Hatch and the plan was initially for definitive surgical repair, however, since the fistulous tract was so small, she wanted to give it 2 more weeks to resolve.   Past Medical History:  Diagnosis Date  . Anemia   . Anxiety   . Asthma   . Depression   . GERD (gastroesophageal reflux disease)   . Hypertension   . Insomnia   . Migraine     Past Surgical History:  Procedure Laterality Date  . CESAREAN SECTION    . CHOLECYSTECTOMY    . IR RADIOLOGIST EVAL & MGMT  05/20/2017  . IR RADIOLOGIST EVAL & MGMT  06/18/2017  . IR RADIOLOGIST EVAL & MGMT  07/02/2017  . IR RADIOLOGIST EVAL & MGMT  07/16/2017  . IR RADIOLOGIST EVAL & MGMT  07/31/2017  . IR RADIOLOGIST EVAL & MGMT  08/13/2017  . KNEE ARTHROSCOPY    . LAPAROSCOPY N/A 05/22/2017   Procedure: LAPAROSCOPY DIAGNOSTIC, LYSIS OF ADHESIONS,  DRAINAGE OF ABCESS, RIGID PROCTOSCOPY, SMALL BOWEL RESECTION X2;  Surgeon: Kinsinger, De Blanch, MD;  Location: [facility];  Service: General;  Laterality: N/A;    Allergies: Patient has no known allergies.  Medications: Prior to Admission medications   Medication Sig Start Date End Date Taking? Authorizing Provider  acetaminophen (TYLENOL) 325 MG tablet Take 2 tablets (650 mg total) by mouth every 6 (six) hours as needed for mild pain (or temp > 100). Patient not taking: Reported on 06/10/2017 05/11/17   Berna Bue, MD  ALPRAZolam Prudy Feeler) 1 MG tablet Take 1 mg by mouth at bedtime as needed for sleep.  02/11/17   [provider]  CALCIUM PO Take 1,000 mg by mouth daily.    [provider]  Cyanocobalamin (VITAMIN B 12 PO) Take 1 tablet by mouth daily.    [provider]  docusate sodium (COLACE) 100 MG capsule Take 1 capsule (100 mg total) by mouth 2 (two) times daily. Patient not taking: Reported on 06/10/2017 05/11/17   Berna Bue, MD  losartan-hydrochlorothiazide (HYZAAR) 100-25 MG tablet Take 1 tablet by mouth daily. 03/11/17   [provider]  meloxicam (MOBIC) 15 MG tablet Take 15 mg by mouth daily at 12 noon. 04/21/17   [provider]  omeprazole (PRILOSEC) 40 MG capsule Take 40 mg by mouth daily. 03/28/17   [provider]  ondansetron (ZOFRAN-ODT) 4 MG disintegrating tablet Take 1 tablet (4 mg total) by mouth every 6 (six) hours as needed for nausea or vomiting. 06/12/17   Carlena Bjornstad  A, PA-C  oxyCODONE (OXY IR/ROXICODONE) 5 MG immediate release tablet Take 1 tablet (5 mg total) by mouth every 6 (six) hours as needed for severe pain. 06/12/17   Meuth, Brooke A, PA-C  Polysaccharide Iron Complex (IFEREX 150 PO) Take 150 mg by mouth daily.    [provider]     No family history on file.  Social History   Socioeconomic History  . Marital status: Married    Spouse name: Not on file  . Number of children: Not on file   . Years of education: Not on file  . Highest education level: Not on file  Occupational History  . Not on file  Social Needs  . Financial resource strain: Not on file  . Food insecurity:    Worry: Not on file    Inability: Not on file  . Transportation needs:    Medical: Not on file    Non-medical: Not on file  Tobacco Use  . Smoking status: Never Smoker  . Smokeless tobacco: Never Used  Substance and Sexual Activity  . Alcohol use: Yes    Comment: social  . Drug use: Never  . Sexual activity: Not on file  Lifestyle  . Physical activity:    Days per week: Not on file    Minutes per session: Not on file  . Stress: Not on file  Relationships  . Social connections:    Talks on phone: Not on file    Gets together: Not on file    Attends religious service: Not on file    Active member of club or organization: Not on file    Attends meetings of clubs or organizations: Not on file    Relationship status: Not on file  Other Topics Concern  . Not on file  Social History Narrative  . Not on file   Review of Systems  Vital Signs: There were no vitals taken for this visit.  Physical Exam Awake and alert NAD Anterior abdominal drain in place Only a tiny amount, maybe 2 mL, tan cloudy drainage in bulb. Patient states this is all that has drained in 2 weeks since she was seen on 7/3.  Drain injection shows decompressed abscess cavity, a very small amount of extravasation to the left fallopian tube.   Imaging: Dg Sinus/fist Tube Chk-non Gi  Result Date: 08/13/2017 CLINICAL DATA:  History of partial small bowel resection and lysis of adhesions related to small bowel perforation, post CT-guided percutaneous drainage catheter placement for postoperative intra-abdominal abscess on 06/10/2017. Subsequent abdominal CT performed 07/02/2017 demonstrates resolution of the pelvic abscess, however subsequent percutaneous drainage catheter injections, most recently performed on 07/31/2017,  demonstrated a persistent fistulous connection between the decompressed abscess cavity in the adjacent intestines. Patient returns today to the interventional radiology drain Clinic for catheter evaluation and management. The patient is not flushing the percutaneous drainage catheter and reports little to no output from the percutaneous drainage catheter for the past several days. Patient is not currently taking antibiotics though denies fever, chills or worsening abdominal pain. Note, patient also underwent a right trans gluteal approach percutaneous drainage catheter placement on 04/30/2017, however this was subsequent removed on 05/20/2017. EXAM: ABSCESS INJECTION COMPARISON:  Multiple previous fluoroscopic guided drainage catheter injections, most recently on 07/31/2017; CT abdomen pelvis - 06/18/2017; CT-guided percutaneous drainage catheter placement-06/10/2017 CONTRAST:  15 cc Isovue-300, administered via the percutaneous drainage catheter FLUOROSCOPY TIME:  1 minute, 12 seconds (37 mGy) TECHNIQUE: The patient was positioned supine on the  fluoroscopy table. A preprocedural spot fluoroscopic image was obtained of the pelvis and existing percutaneous drainage catheter Multiple spot fluoroscopic and radiographic images were obtained following the injection of a small amount of contrast via the existing percutaneous drainage catheter. Images reviewed in the procedure was terminated. The drainage catheter was reconnected to a JP bulb pre dressings placed. The patient tolerated the procedure well without immediate postprocedural complication. FINDINGS: Preprocedural spot fluoroscopic image demonstrates unchanged positioning of percutaneous drainage catheter with end coiled and locked overlying the left mid hemi pelvis. Contrast injection demonstrates decompression of an abscess cavity with persistent communication to the adjacent intestines. 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 PLAN: Per patient report, the patient was seen in consultation by Dr. Sheliah HatchKinsinger last week and if today's examination remained positive for a fistula, the patient is to undergo definitive surgical resection. As such, the patient was encouraged to maintain her current management of the percutaneous drainage catheter but may otherwise follow-up with the interventional drain clinic on a p.r.n. basis. Electronically Signed   By: Simonne ComeJohn  Watts M.D.   On: 08/13/2017 11:37   Dg Sinus/fist Tube Chk-non Gi  Result Date: 07/31/2017 INDICATION: Lower abdominal abscess drain with fistula EXAM: ABSCESS DRAIN INJECTION FLUOROSCOPY TIME:  18 seconds.  Ten mGy. MEDICATIONS: The patient is currently admitted to the hospital and receiving intravenous antibiotics. The antibiotics were administered within an appropriate time frame prior to the initiation of the procedure. ANESTHESIA/SEDATION: None COMPLICATIONS: None immediate. PROCEDURE: Informed written consent was obtained from the patient after a thorough discussion of the procedural risks, benefits and alternatives. All questions were addressed. Maximal Sterile Barrier Technique was utilized including caps, mask, sterile gowns, sterile gloves, sterile drape, hand hygiene and skin antiseptic. A timeout was performed prior to the initiation of the procedure. A scout image was performed. Subsequently, contrast was injected and imaging was performed. The catheter was then flushed and attached to a JP bulb. FINDINGS: The abscess cavity remains decompressed. 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 Contrast fills the fistulous tract and adjacent sigmoid colon. 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 Electronically Signed   By: Jolaine ClickArthur  Hoss M.D.   On: 07/31/2017 10:58   Ir Radiologist Eval & Mgmt  Result Date: 08/13/2017 Please refer to notes tab for details about interventional procedure. (Op Note)  Ir  Radiologist Eval & Mgmt  Result Date: 07/31/2017 Please refer to notes tab for details about interventional procedure. (Op Note)   Labs:  CBC: Recent Labs    06/09/17 1710 06/10/17 0131 06/10/17 0853 06/11/17 0701  WBC 18.9* 15.0* 14.3* 10.0  HGB 8.0* 7.4* 6.8* 8.0*  HCT 24.9* 22.4* 20.8* 24.8*  PLT 793* 628* 627* 569*    COAGS: Recent Labs    05/06/17 0539 06/10/17 1142  INR 1.04 1.04  APTT 29  --     BMP: Recent Labs    05/29/17 0416 06/09/17 1710 06/10/17 0853 06/11/17 0701 06/12/17 0644  NA 141 134*  --  138 139  K 4.0 3.8  --  3.8 4.1  CL 102 97*  --  104 103  CO2 28 24  --  25 26  GLUCOSE 96 132*  --  107* 116*  BUN 5* 29*  --  9 5*  CALCIUM 8.8* 9.2  --  8.4* 8.5*  CREATININE 0.81 1.22* 0.94 0.64 0.72  GFRNONAA >60 48* >60 >60 >60  GFRAA >60 56* >60 >60 >60  LIVER FUNCTION TESTS: Recent Labs    05/21/17 1432 06/10/17 0131 06/11/17 0701 06/12/17 0644  BILITOT 0.3 0.6 0.6 0.3  AST 23 31 90* 39  ALT 22 21 49 42  ALKPHOS 161* 255* 364* 336*  PROT 7.9 6.6 6.0* 6.1*  ALBUMIN 3.4* 2.1* 1.8* 1.8*    TUMOR MARKERS: No results for input(s): AFPTM, CEA, CA199, CHROMGRNA in the last 8760 hours.  Assessment/Plan:  Postoperative abscess status post CT-guided percutaneous drainage catheter placement on 06/10/2017.  Injection of the drain today shows a tiny amount of extravasation toward the fallopian tube.  CT scan with contrast also done today. It does NOT appear there is a fistulous connection to the bowel.  Dr. Lowella Dandy has reviewed all images.  Drain removed without difficulty and clean dry dressing placed.  Instructions given to patient to leave dressing in place x 48 hours.   She is to avoid swimming until the area is healed.  Electronically Signed: Gwynneth Macleod PA-C 08/26/2017, 1:23 PM    Please refer to Dr. Manon Hilding attestation of this note for management and plan.

## 2017-08-26 NOTE — Progress Notes (Signed)
  Patient was seen in clinic today for evaluation of her drain (see progress note).  CT scan showed mild T12 endplate fracture.  Called patient to evaluate symptoms.  She does have some pain initially upon rising in the morning, but pain goes away pretty quickly.  She reports no other symptoms.   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  I explained CT findings. Since she is fairly asymptomatic, she does not wish to pursue any further workup or procedure at this time.  She understands if her back symptoms worsen she can give us a call.  Mohini Heathcock S Platon Arocho PA-C 08/26/2017 4:23 PM

## 2017-09-19 ENCOUNTER — Ambulatory Visit
Admission: RE | Admit: 2017-09-19 | Discharge: 2017-09-19 | Disposition: A | Discharge status: Home / Self Care | Payer: BC Managed Care – PPO | Source: Ambulatory Visit | Attending: Surgery | Admitting: Surgery

## 2017-09-19 ENCOUNTER — Other Ambulatory Visit: Payer: Self-pay | Admitting: Surgery

## 2017-09-19 DIAGNOSIS — K631 Perforation of intestine (nontraumatic): Secondary | ICD-10-CM

## 2017-09-19 IMAGING — CT CT ABD-PELV W/ CM
2 of 5 series · 11 of 46 positions shown, 12 images · IV contrast (iopamidol)
Comparison: 08/26/2017

CLINICAL DATA: 56-year-old female with a history of low-grade
fever.

Prior abscess secondary to diverticular disease, with trans gluteal
drain placed 05/06/2017, anterior drain placed 06/10/2017. Fistula
to the colon has been previously demonstrated..
Drains were removed 08/26/2017, with improvement.
EXAM:
CT ABDOMEN AND PELVIS WITH CONTRAST
TECHNIQUE: Multidetector CT imaging of the abdomen and pelvis was performed
using the standard protocol following bolus administration of
intravenous contrast.
CONTRAST:  100mL 7P44B5-NTT IOPAMIDOL (7P44B5-NTT) INJECTION 61%

[Series 2: abd pelvis 5.00 br40 s3 ax · axial · 0.56mm/px · z∈[+1278,+1623]mm · 8 of 87 slices shown, 9 images]
[im 9/87  soft-tissue]
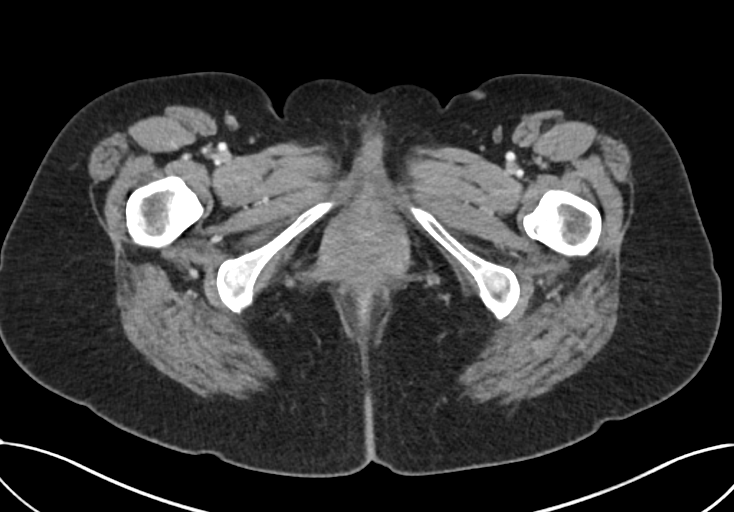
[im 9/87  bone]
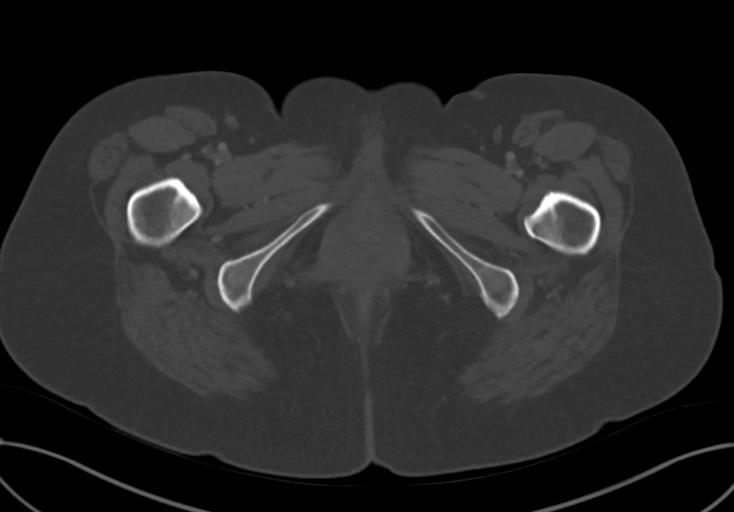
[im 18/87  soft-tissue]
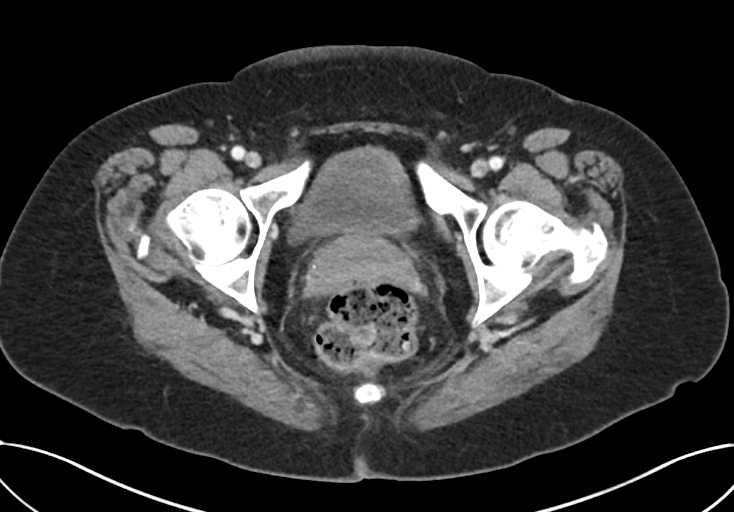
[im 26/87  soft-tissue]
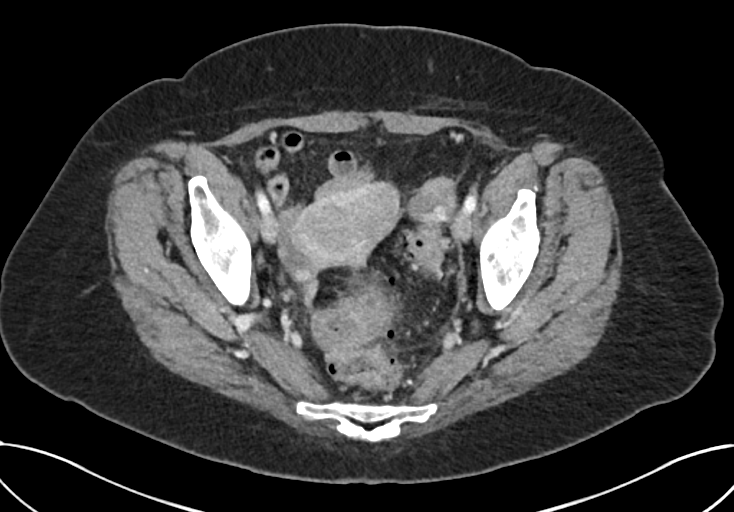
[im 39/87  soft-tissue]
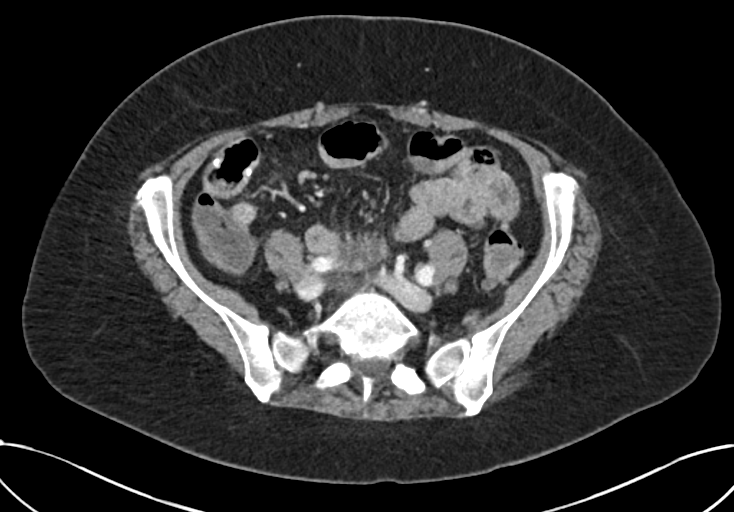
[im 48/87  soft-tissue]
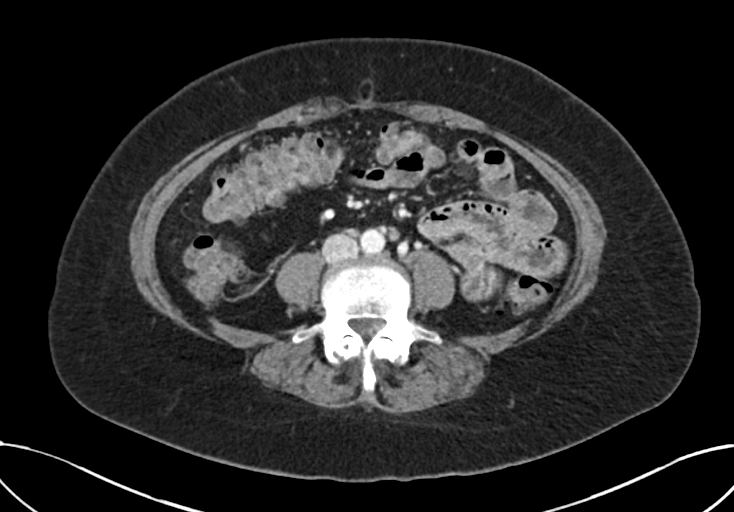
[im 61/87  soft-tissue]
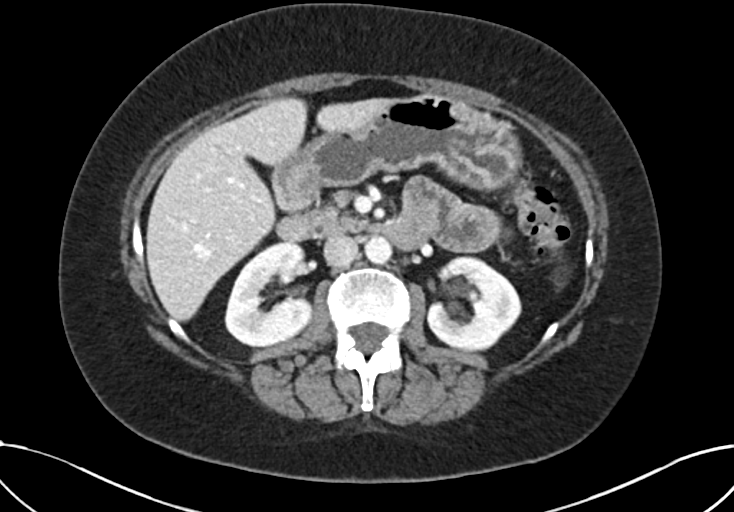
[im 69/87  soft-tissue]
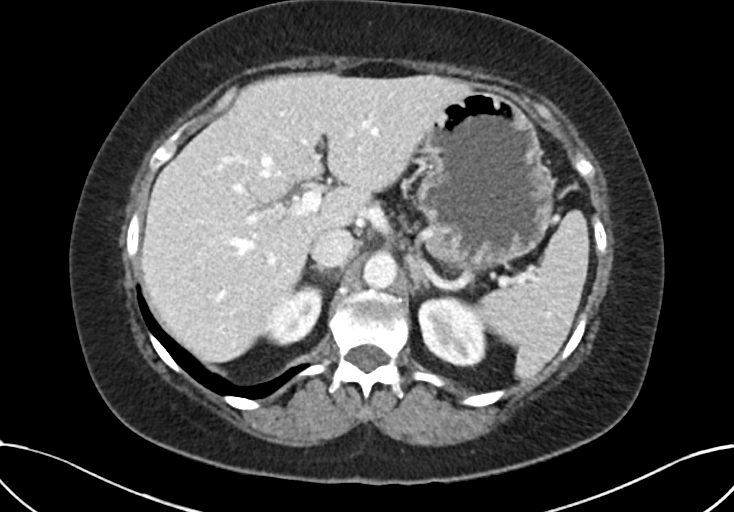
[im 78/87  soft-tissue]
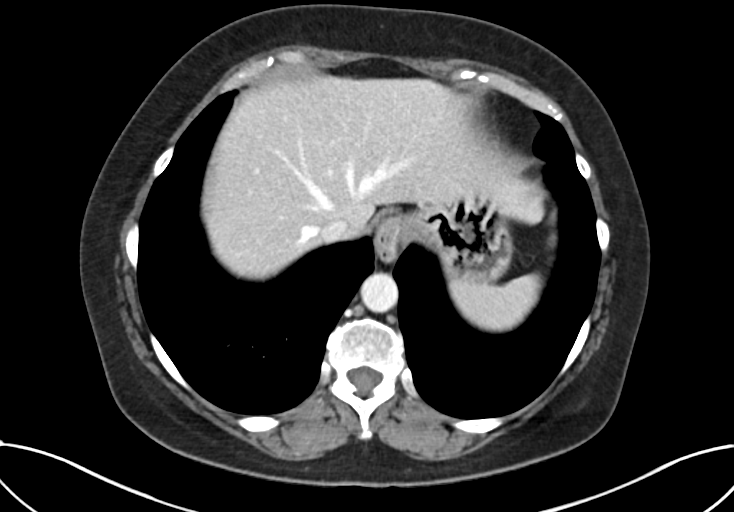

[Series 6: abd pelvis 2.00 br40 s3 cor · coronal · 0.81mm/px · 3 of 144 slices shown]
[im 48/144  soft-tissue]
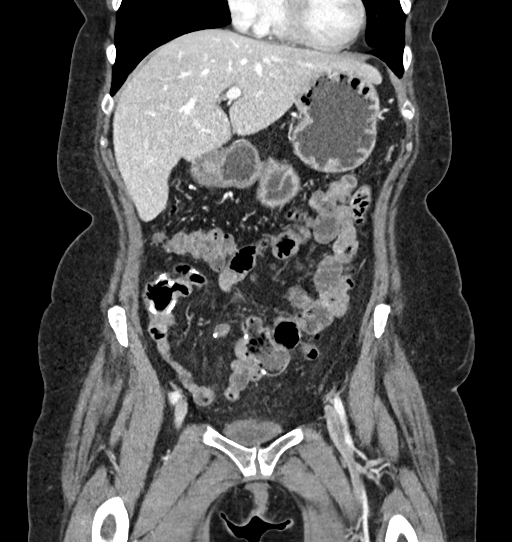
[im 64/144  soft-tissue]
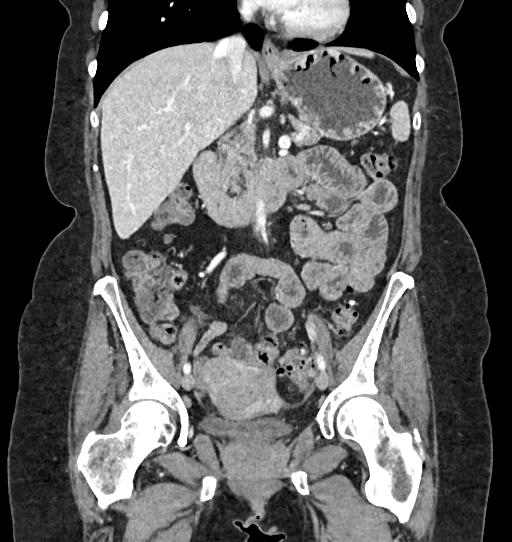
[im 80/144  soft-tissue]
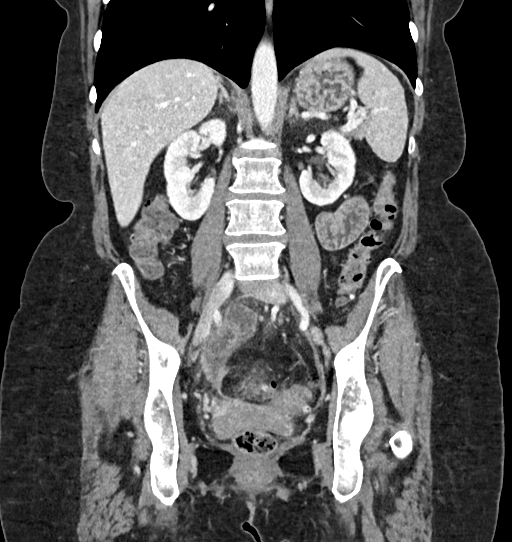

[11 of 46 positions shown; findings below may reference images not displayed]

FINDINGS: Lower chest: No acute finding of the lower chest

Hepatobiliary: Unremarkable liver.  Cholecystectomy.

Pancreas: Unremarkable pancreas

Spleen: Unremarkable spleen

Adrenals/Urinary Tract: Unremarkable adrenal glands.

Unremarkable right kidney without hydronephrosis or nephrolithiasis.
Unremarkable course the right ureter.

Unremarkable left kidney with no hydronephrosis or nephrolithiasis.
Unremarkable course the left ureter.

Urinary bladder is decompressed.

Stomach/Bowel: Small hiatal hernia. Unremarkable stomach. Small
bowel without transition point. No focal inflammation. Surgical
changes in the low abdomen of prior small bowel resection with
patent anastomosis. No focal fluid or inflammatory changes. Proximal
colon unremarkable with mild stool burden. Diverticular change
present throughout the colon. Diverticular changes most pronounced
in the sigmoid region. Circumferential wall thickening of the
sigmoid colon on today's study which is increased from the
comparison CT of 08/26/2017. Infiltration of the fat adjacent to the
sigmoid colon with trace fluid. No abscess or evidence of
perforation.

Compared to the prior studies there has been complete resolution of
multifocal abscesses, with removal of the drains.

Vascular/Lymphatic: Mild atherosclerotic changes. Mesenteric
arteries, renal arteries, bilateral iliac arteries and common
femoral arteries patent.

No lymphadenopathy.

Reproductive: Fibroid uterus.  Bilateral adnexa unremarkable.

Other: No hernia.

Musculoskeletal: No acute displaced fracture. Similar appearance T12
endplate fracture without progression. No retropulsion of fracture
fragments.
IMPRESSION: New/recurrent sigmoid diverticulitis, without abscess or
perforation.

Interval resolution of previously drained multifocal abscesses, with
prior removal of drainage catheters.

Cholecystectomy.

Surgical changes of prior small bowel resection.

These results will be called to the ordering clinician or
representative by the Radiologist Assistant, and communication
documented in the PACS or zVision Dashboard.

## 2017-09-19 MED ORDER — IOPAMIDOL (ISOVUE-300) INJECTION 61%
100.0000 mL | Freq: Once | INTRAVENOUS | Status: AC | PRN
  Administered 2017-09-19: 100 mL via INTRAVENOUS

## 2017-10-02 ENCOUNTER — Inpatient Hospital Stay (HOSPITAL_COMMUNITY): Admit: 2017-10-02 | Payer: BC Managed Care – PPO | Admitting: General Surgery

## 2017-10-02 ENCOUNTER — Encounter (HOSPITAL_COMMUNITY): Payer: Self-pay

## 2017-10-02 SURGERY — COLECTOMY, SIGMOID, LAPAROSCOPIC
Anesthesia: General

## 2018-03-27 ENCOUNTER — Other Ambulatory Visit: Payer: Self-pay | Admitting: Internal Medicine

## 2018-03-27 DIAGNOSIS — K5792 Diverticulitis of intestine, part unspecified, without perforation or abscess without bleeding: Secondary | ICD-10-CM

## 2018-04-06 ENCOUNTER — Ambulatory Visit
Admission: RE | Admit: 2018-04-06 | Discharge: 2018-04-06 | Disposition: A | Discharge status: Home / Self Care | Payer: BC Managed Care – PPO | Source: Ambulatory Visit | Attending: Internal Medicine | Admitting: Internal Medicine

## 2018-04-06 DIAGNOSIS — K5792 Diverticulitis of intestine, part unspecified, without perforation or abscess without bleeding: Secondary | ICD-10-CM

## 2018-04-06 IMAGING — CT CT ABD-PELV W/ CM
2 of 5 series · 14 of 46 positions shown, 16 images · IV contrast (iopamidol)
Comparison: 09/19/2017

CLINICAL DATA: Epigastric pain, left lower quadrant pain,
diverticulitis in treated, history of perforation

EXAM:
CT ABDOMEN AND PELVIS WITH CONTRAST
TECHNIQUE: Multidetector CT imaging of the abdomen and pelvis was performed
using the standard protocol following bolus administration of
intravenous contrast.
CONTRAST:  100mL 1PSTXW-6BB IOPAMIDOL (1PSTXW-6BB) INJECTION 61%,
additional oral enteric contrast

[Series 2: abd pelvis 5.00 br40 s3 ax · axial · 0.59mm/px · z∈[+1036,+1381]mm · 11 of 79 slices shown, 13 images]
[im 5/79  soft-tissue]
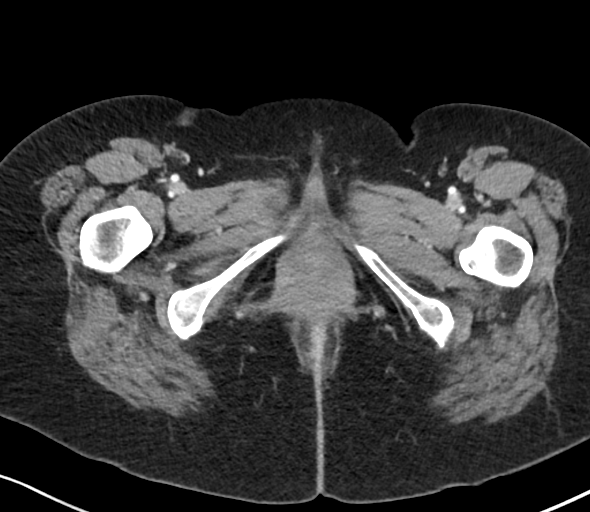
[im 5/79  bone]
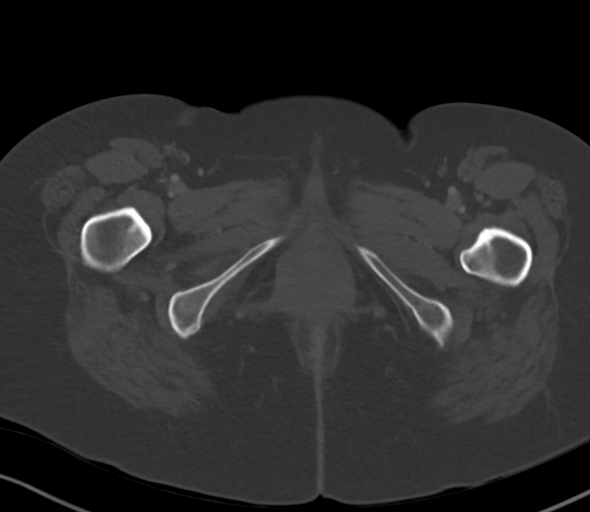
[im 13/79  soft-tissue]
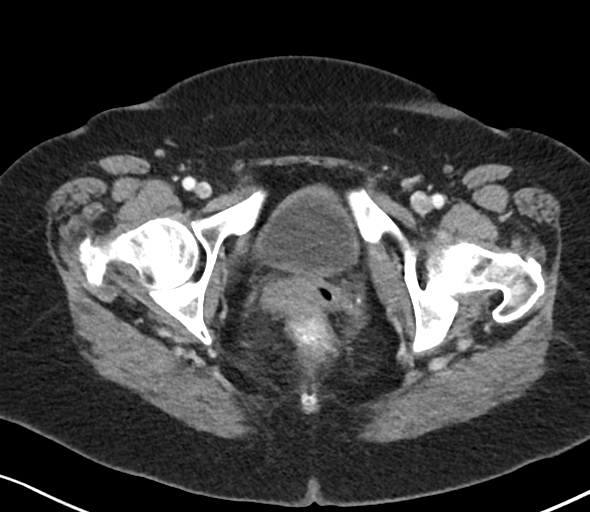
[im 21/79  soft-tissue]
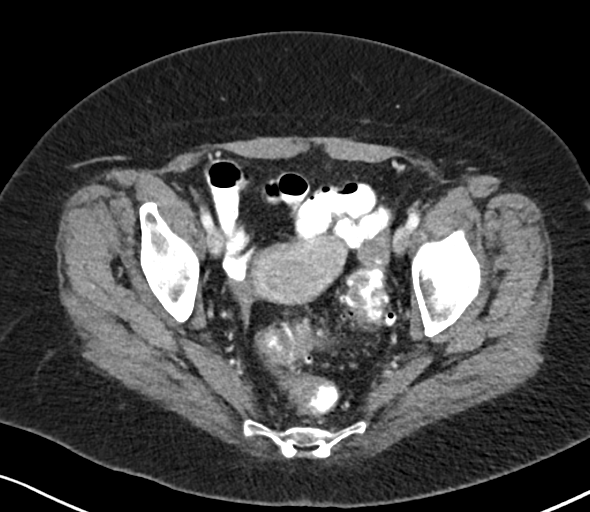
[im 25/79  soft-tissue]
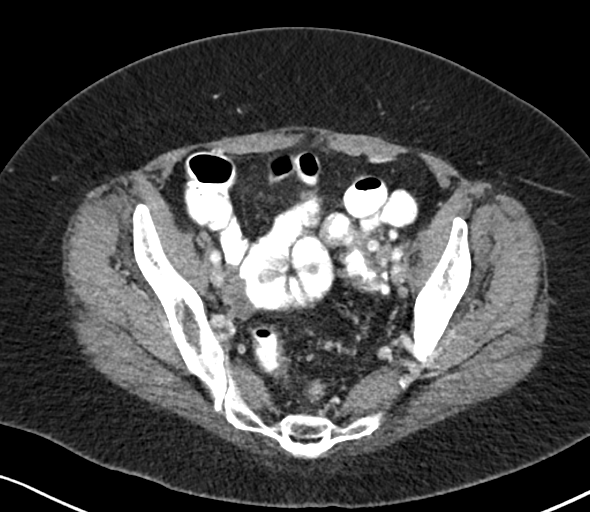
[im 33/79  soft-tissue]
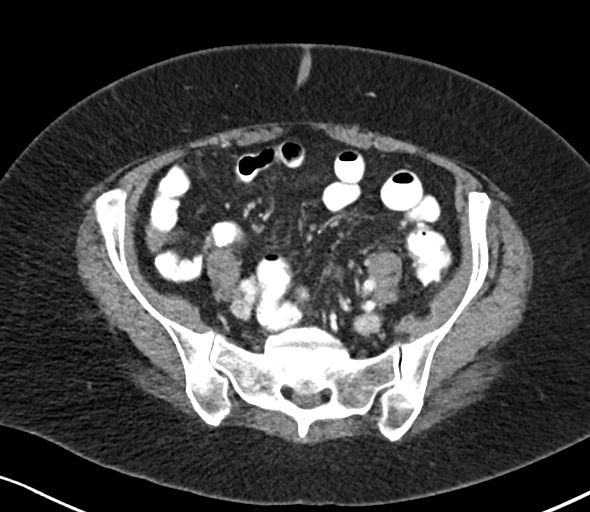
[im 42/79  soft-tissue]
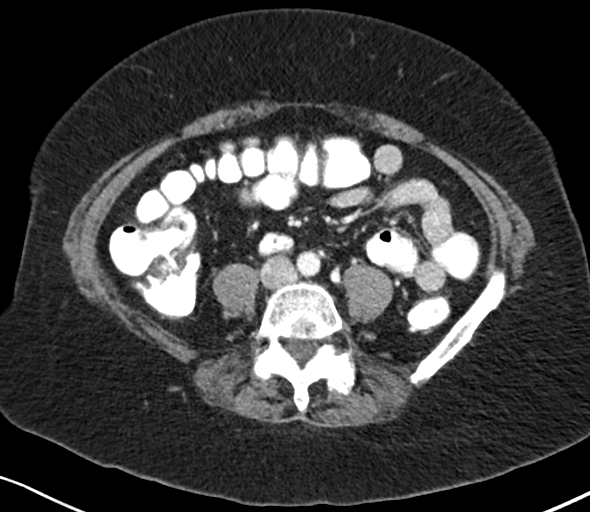
[im 46/79  soft-tissue]
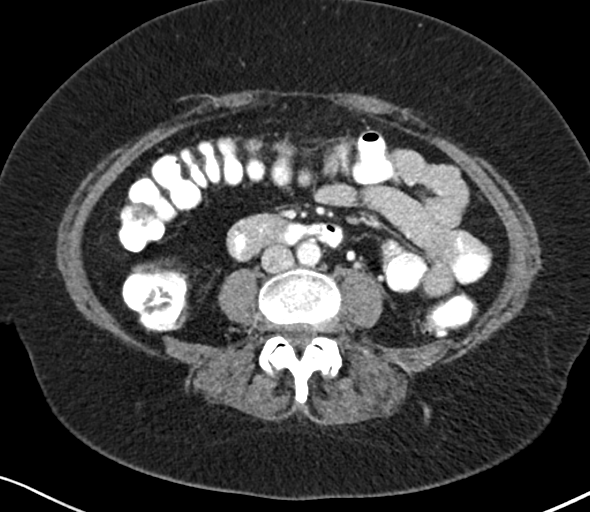
[im 54/79  soft-tissue]
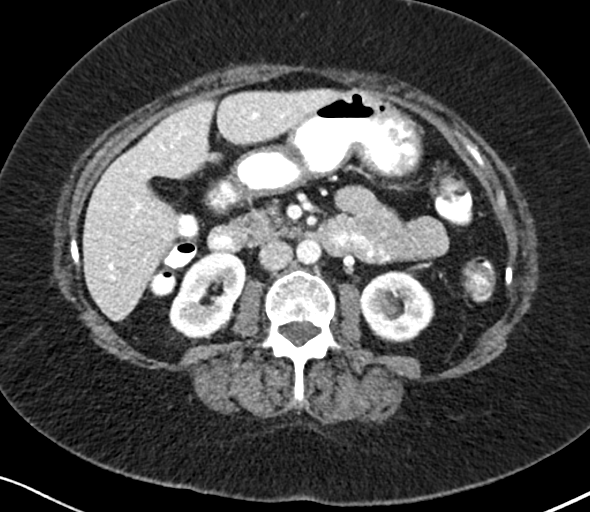
[im 58/79  soft-tissue]
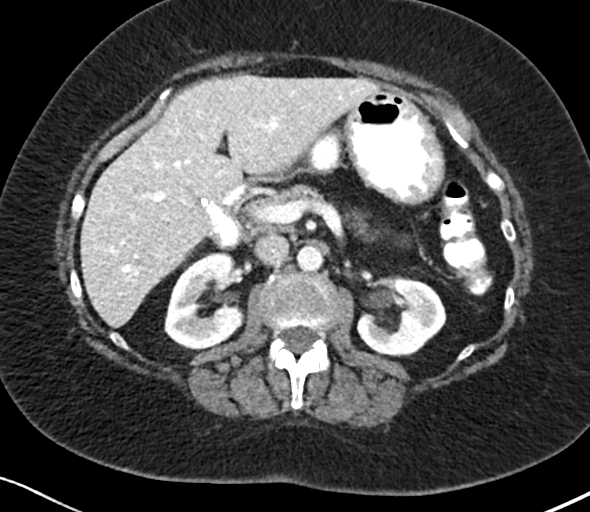
[im 58/79  bone]
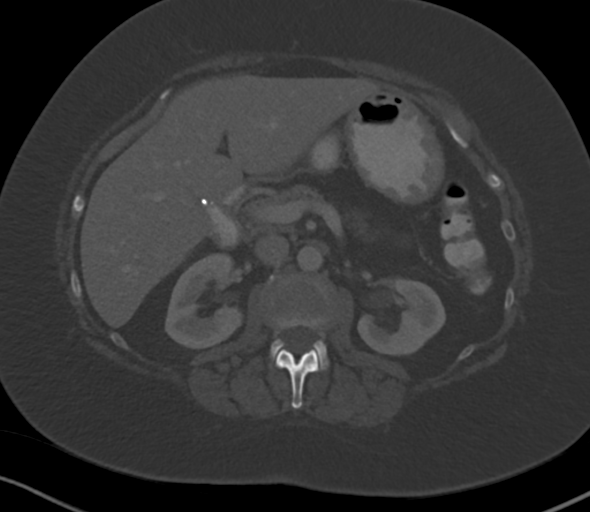
[im 66/79  soft-tissue]
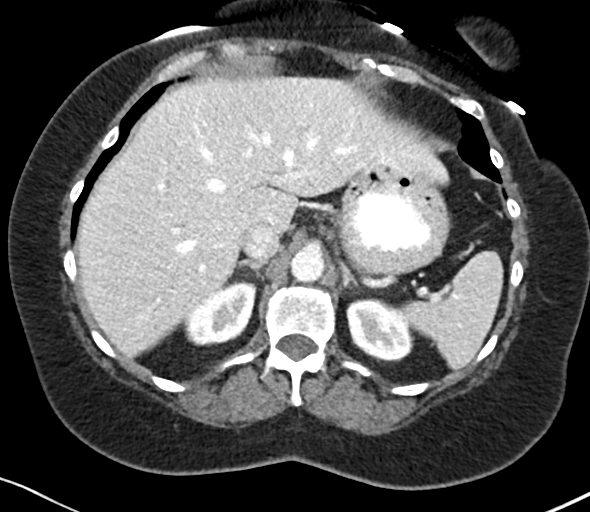
[im 74/79  soft-tissue]
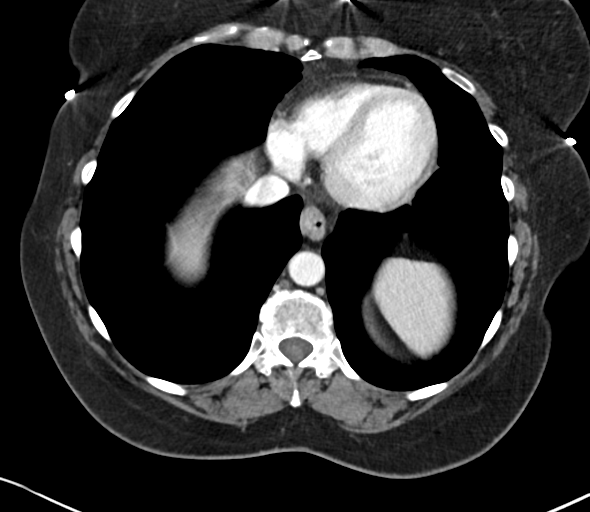

[Series 6: abd pelvis 2.00 br40 s3 cor · coronal · 0.68mm/px · 3 of 150 slices shown]
[im 50/150  soft-tissue]
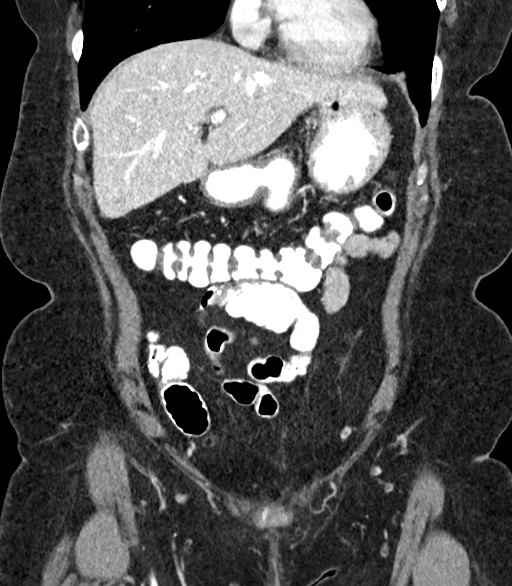
[im 67/150  soft-tissue]
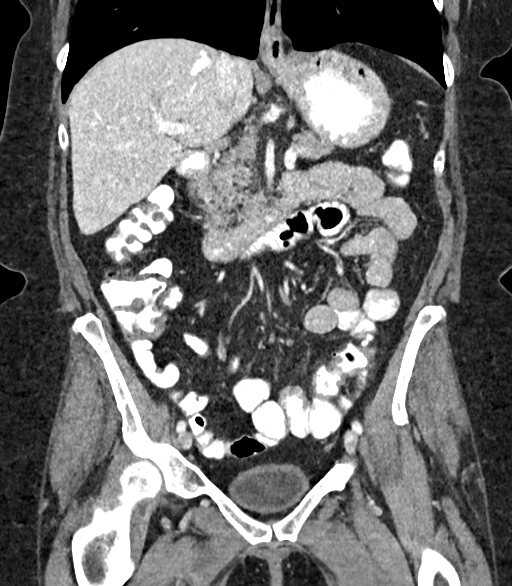
[im 83/150  soft-tissue]
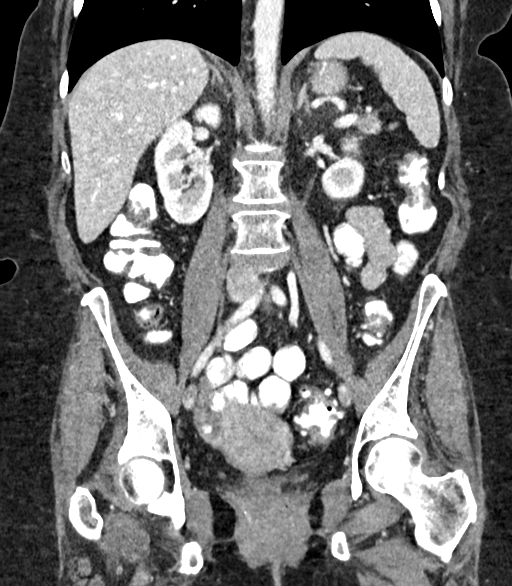

[14 of 46 positions shown; findings below may reference images not displayed]

FINDINGS: Lower chest: No acute abnormality.

Hepatobiliary: No focal liver abnormality is seen. Status post
cholecystectomy. No biliary dilatation.

Pancreas: Unremarkable. No pancreatic ductal dilatation or
surrounding inflammatory changes.

Spleen: Normal in size without focal abnormality.

Adrenals/Urinary Tract: Adrenal glands are unremarkable. Kidneys are
normal, without renal calculi, focal lesion, or hydronephrosis.
Bladder is unremarkable.

Stomach/Bowel: Stomach is within normal limits. Appendix not well
visualized and may be surgically absent. Evidence of multiple prior
small bowel resections and anastomoses. Severe descending and
sigmoid diverticulosis with thickening and mild vascular combing
about the mid to distal sigmoid colon.

Vascular/Lymphatic: No significant vascular findings are present. No
enlarged abdominal or pelvic lymph nodes.

Reproductive: No mass or other abnormality.

Other: No abdominal wall hernia or abnormality. No abdominopelvic
ascites.

Musculoskeletal: No acute or significant osseous findings.
IMPRESSION: 1. Severe descending and sigmoid diverticulosis with thickening and
mild vascular combing about the mid to distal sigmoid colon. No
definite evidence of acute diverticulitis as this appearance may be
seen in chronic sequelae of prior diverticulitis. No evidence of
perforation or abscess.

2.  Other chronic and incidental findings as detailed above.

## 2018-04-06 MED ORDER — IOPAMIDOL (ISOVUE-300) INJECTION 61%
100.0000 mL | Freq: Once | INTRAVENOUS | Status: AC | PRN
Start: 2018-04-06 — End: 2018-04-06
  Administered 2018-04-06: 100 mL via INTRAVENOUS

## 2018-04-28 DIAGNOSIS — K5792 Diverticulitis of intestine, part unspecified, without perforation or abscess without bleeding: Secondary | ICD-10-CM | POA: Insufficient documentation

## 2018-06-30 DIAGNOSIS — F419 Anxiety disorder, unspecified: Secondary | ICD-10-CM | POA: Insufficient documentation

## 2018-06-30 DIAGNOSIS — I10 Essential (primary) hypertension: Secondary | ICD-10-CM | POA: Insufficient documentation

## 2018-06-30 DIAGNOSIS — K219 Gastro-esophageal reflux disease without esophagitis: Secondary | ICD-10-CM | POA: Insufficient documentation

## 2018-07-28 ENCOUNTER — Other Ambulatory Visit: Payer: Self-pay

## 2018-07-28 ENCOUNTER — Other Ambulatory Visit: Payer: Self-pay | Admitting: Family Medicine

## 2018-07-28 ENCOUNTER — Ambulatory Visit: Payer: Self-pay

## 2018-07-28 DIAGNOSIS — M542 Cervicalgia: Secondary | ICD-10-CM
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
## 2018-11-02 ENCOUNTER — Other Ambulatory Visit: Payer: Self-pay

## 2018-11-02 DIAGNOSIS — Z20822 Contact with and (suspected) exposure to covid-19: Secondary | ICD-10-CM

## 2018-11-03 LAB — NOVEL CORONAVIRUS, NAA: SARS-CoV-2, NAA: NOT DETECTED

## 2020-02-02 ENCOUNTER — Other Ambulatory Visit: Payer: Self-pay | Admitting: Internal Medicine

## 2020-02-02 DIAGNOSIS — R1032 Left lower quadrant pain: Secondary | ICD-10-CM

## 2020-02-08 ENCOUNTER — Ambulatory Visit
Admission: RE | Admit: 2020-02-08 | Discharge: 2020-02-08 | Disposition: A | Discharge status: Home / Self Care | Payer: BC Managed Care – PPO | Source: Ambulatory Visit | Attending: Internal Medicine | Admitting: Internal Medicine

## 2020-02-08 DIAGNOSIS — R1032 Left lower quadrant pain: Secondary | ICD-10-CM

## 2020-02-08 IMAGING — CT CT ABD-PELV W/ CM
2 of 5 series · 17 of 46 positions shown, 19 images · IV contrast (iopamidol)
Comparison: 04/06/2018

CLINICAL DATA: Left lower quadrant pain

EXAM:
CT ABDOMEN AND PELVIS WITH CONTRAST
TECHNIQUE: Multidetector CT imaging of the abdomen and pelvis was performed
using the standard protocol following bolus administration of
intravenous contrast.
CONTRAST:  100mL [id number] IOPAMIDOL ([id number]) INJECTION 61%

[Series 2: abd pelvis 5.00 br40 s3 axial · axial · 0.80mm/px · z∈[+1113,+1478]mm · 14 of 85 slices shown, 16 images]
[im 6/85  soft-tissue]
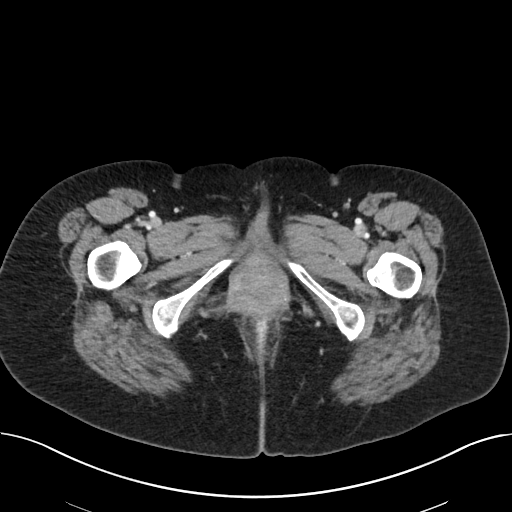
[im 6/85  bone]
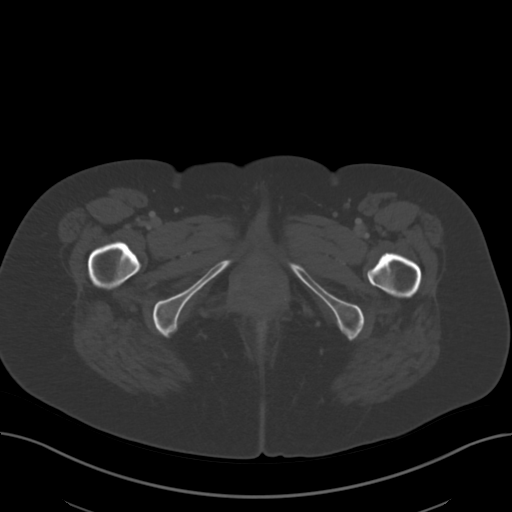
[im 11/85  soft-tissue]
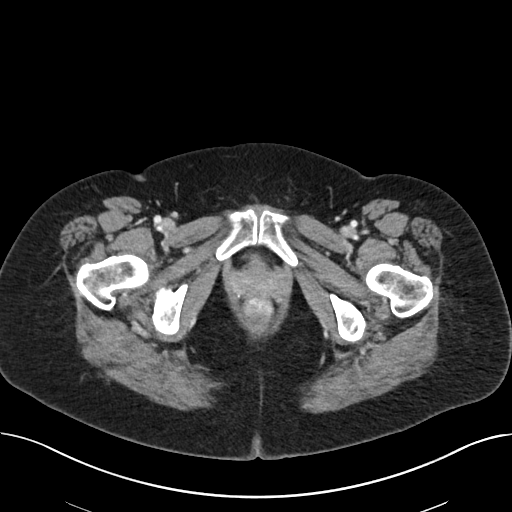
[im 16/85  soft-tissue]
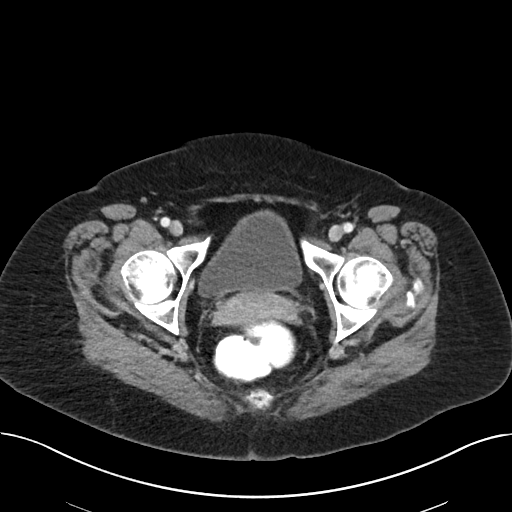
[im 22/85  soft-tissue]
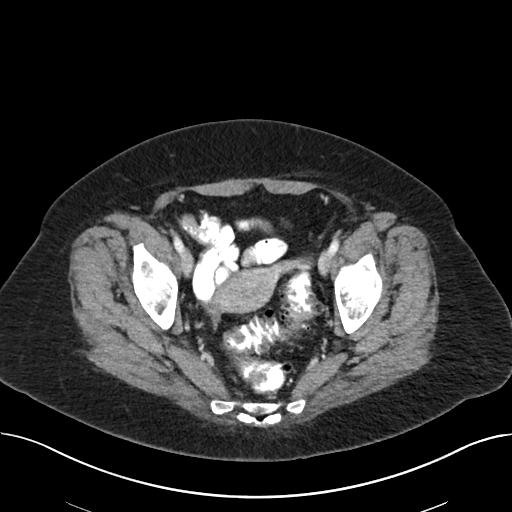
[im 27/85  soft-tissue]
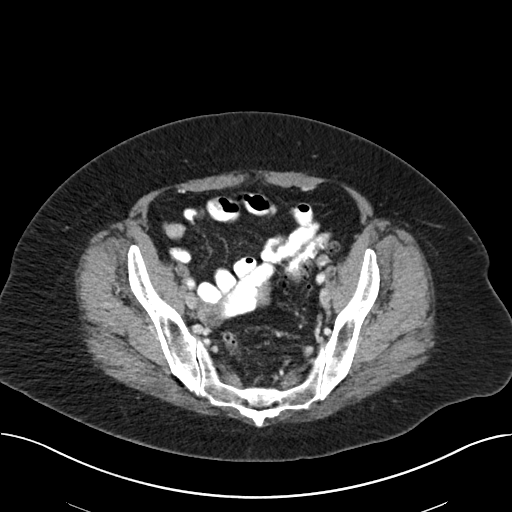
[im 32/85  soft-tissue]
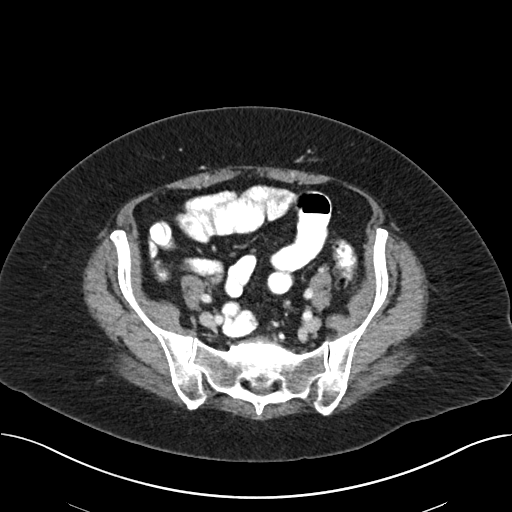
[im 37/85  soft-tissue]
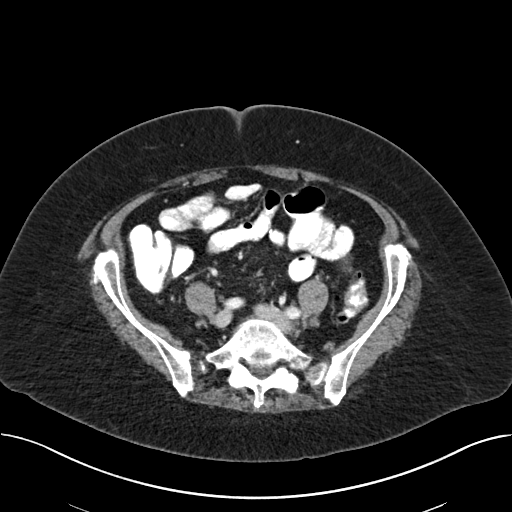
[im 48/85  soft-tissue]
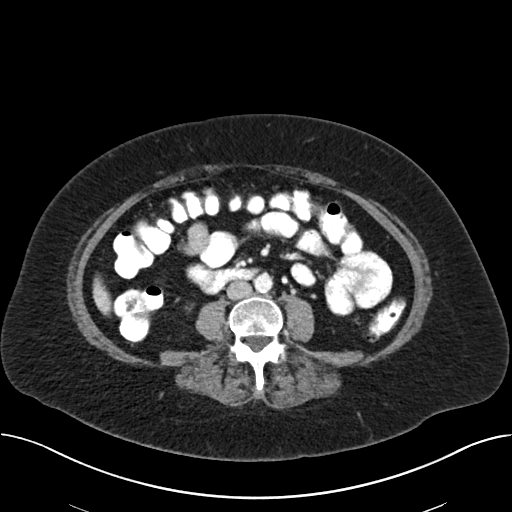
[im 53/85  soft-tissue]
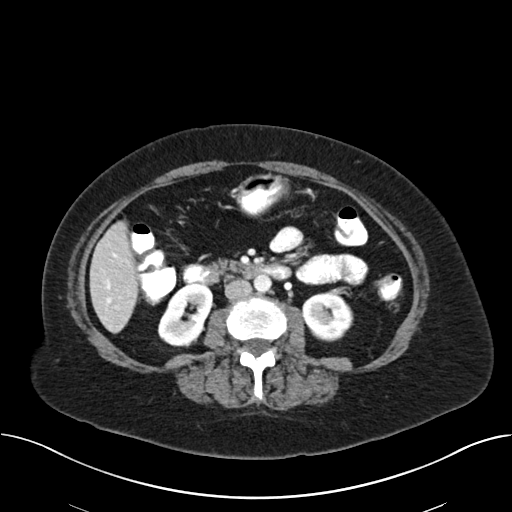
[im 53/85  bone]
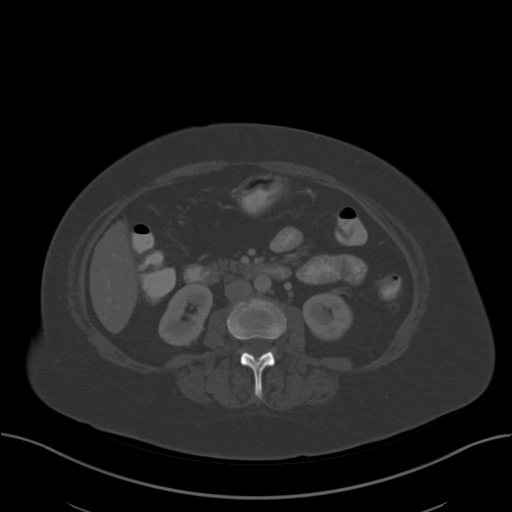
[im 58/85  soft-tissue]
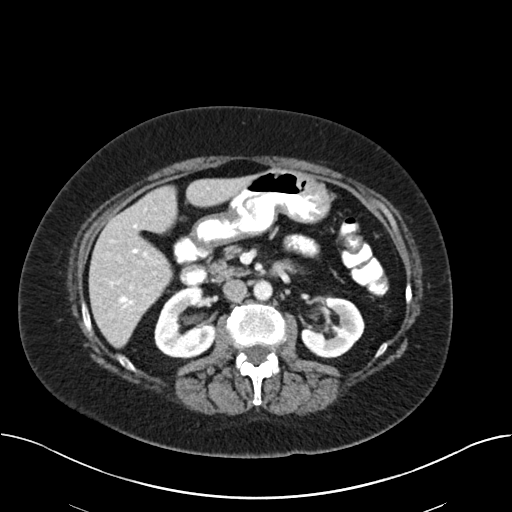
[im 64/85  soft-tissue]
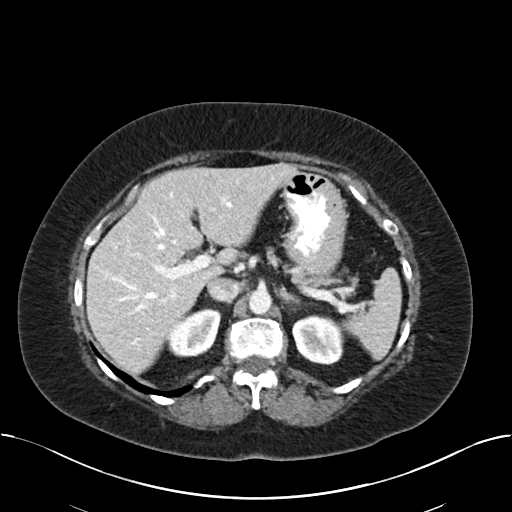
[im 69/85  soft-tissue]
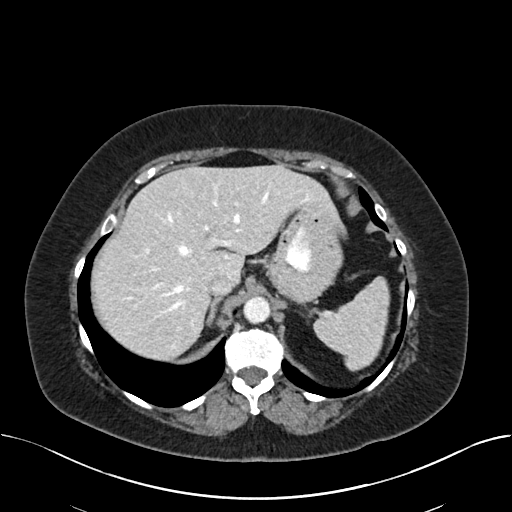
[im 74/85  soft-tissue]
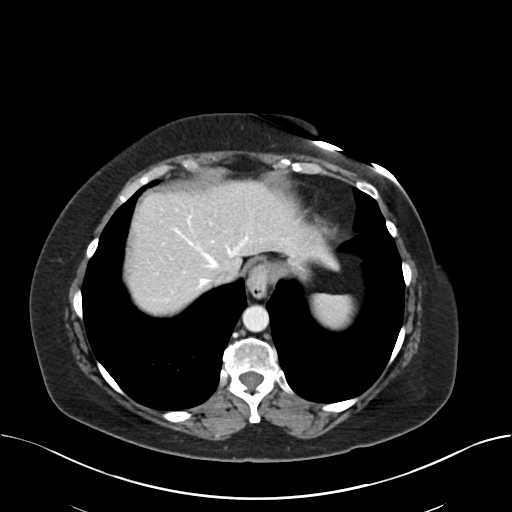
[im 79/85  soft-tissue]
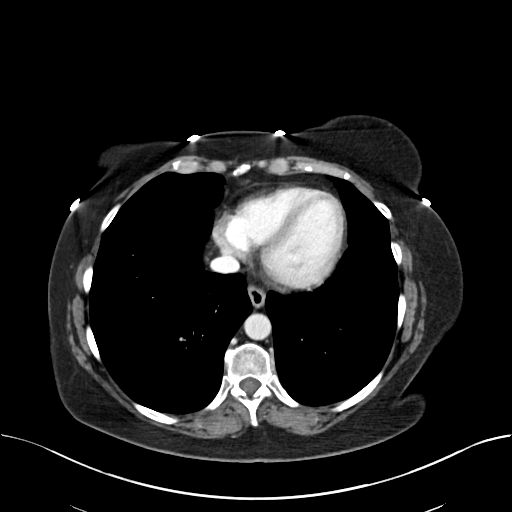

[Series 6: abd pelvis 2.00 br40 s3 cor · coronal · 0.80mm/px · 3 of 205 slices shown]
[im 69/205  soft-tissue]
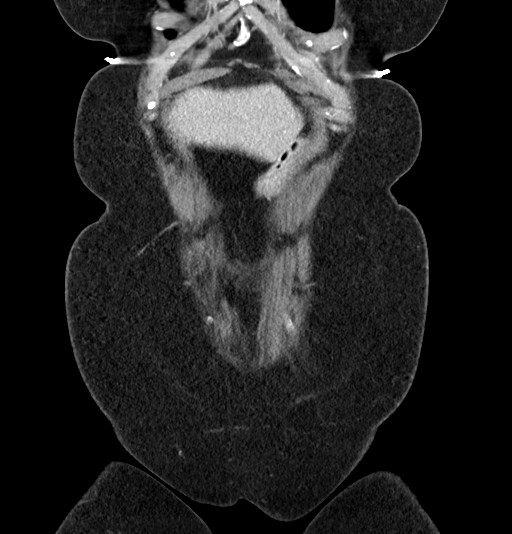
[im 91/205  soft-tissue]
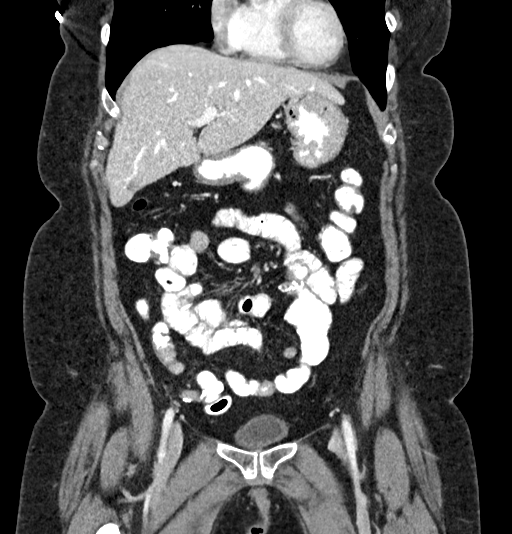
[im 114/205  soft-tissue]
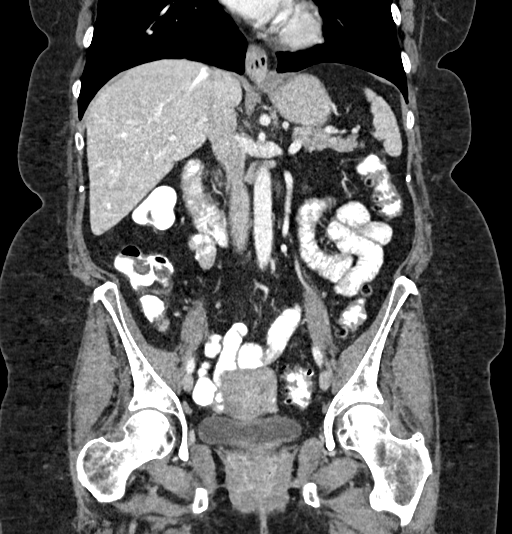

[17 of 46 positions shown; findings below may reference images not displayed]

FINDINGS: Lower chest: Lung bases are clear. No effusions. Heart is normal
size.

Hepatobiliary: No focal liver abnormality is seen. Status post
cholecystectomy. No biliary dilatation.

Pancreas: No focal abnormality or ductal dilatation.

Spleen: No focal abnormality.  Normal size.

Adrenals/Urinary Tract: No adrenal abnormality. No focal renal
abnormality. No stones or hydronephrosis. Urinary bladder is
unremarkable.

Stomach/Bowel: Left colonic diverticulosis. No active
diverticulitis. Stomach, large and small bowel grossly unremarkable.

Vascular/Lymphatic: No evidence of aneurysm or adenopathy.

Reproductive: Uterus and adnexa unremarkable.  No mass.

Other: No free fluid or free air.

Musculoskeletal: No acute bony abnormality.
IMPRESSION: Left colonic diverticulosis.  No active diverticulitis.

No acute findings in the abdomen or pelvis.

## 2020-02-08 MED ORDER — IOPAMIDOL (ISOVUE-300) INJECTION 61%
100.0000 mL | Freq: Once | INTRAVENOUS | Status: AC | PRN
  Administered 2020-02-08: 100 mL via INTRAVENOUS

## 2020-09-05 ENCOUNTER — Telehealth: Payer: Self-pay | Admitting: Internal Medicine

## 2020-09-05 NOTE — Telephone Encounter (Signed)
Ok to schedule, if needing sooner appt may need to start with an APP

## 2020-09-05 NOTE — Telephone Encounter (Signed)
Left message to call back  

## 2020-09-05 NOTE — Telephone Encounter (Signed)
Hi Dr. Rhea Belton, this pt would like to transfer her GI care over to you from Dr. Kinnie Scales because he is retiring. Patient has been dealing with pain on the left side of her abdomen and has diverticulitis.  Records are in Epic. Could you please review them and advise on scheduling? Thank you.

## 2020-10-04 ENCOUNTER — Other Ambulatory Visit: Payer: Self-pay | Admitting: Internal Medicine

## 2020-10-11 ENCOUNTER — Other Ambulatory Visit: Payer: Self-pay | Admitting: Internal Medicine

## 2020-10-11 DIAGNOSIS — R1012 Left upper quadrant pain: Secondary | ICD-10-CM

## 2020-10-28 ENCOUNTER — Other Ambulatory Visit: Payer: BC Managed Care – PPO

## 2022-07-10 ENCOUNTER — Other Ambulatory Visit: Payer: Self-pay | Admitting: Internal Medicine

## 2022-07-10 DIAGNOSIS — R1010 Upper abdominal pain, unspecified: Secondary | ICD-10-CM

## 2022-07-10 DIAGNOSIS — Z8719 Personal history of other diseases of the digestive system: Secondary | ICD-10-CM

## 2022-08-07 ENCOUNTER — Ambulatory Visit
Admission: RE | Admit: 2022-08-07 | Discharge: 2022-08-07 | Disposition: A | Discharge status: Home / Self Care | Payer: BC Managed Care – PPO | Source: Ambulatory Visit | Attending: Internal Medicine | Admitting: Internal Medicine

## 2022-08-07 DIAGNOSIS — Z8719 Personal history of other diseases of the digestive system: Secondary | ICD-10-CM

## 2022-08-07 DIAGNOSIS — R1010 Upper abdominal pain, unspecified: Secondary | ICD-10-CM

## 2022-08-07 MED ORDER — IOPAMIDOL (ISOVUE-300) INJECTION 61%
80.0000 mL | Freq: Once | INTRAVENOUS | Status: AC | PRN
  Administered 2022-08-07: 80 mL via INTRAVENOUS

## 2023-02-14 ENCOUNTER — Encounter: Payer: Self-pay | Admitting: Gastroenterology

## 2023-02-14 ENCOUNTER — Telehealth: Payer: Self-pay | Admitting: Internal Medicine

## 2023-02-14 NOTE — Telephone Encounter (Signed)
 Good morning Dr. Albertus,    Patient called requesting to transfer her care to [facility]. Patient has history with [facility] and was last seen in 2023. Patient requested to transfer her in 2022 and was accepted by you as a patient but continued to be seen with [facility]. Patient is now requesting to transfer her care to Dr. Legrand for diverticulitis due to Dr. Luis being retired. Please review and advise on request.    Thank you.

## 2023-02-14 NOTE — Telephone Encounter (Signed)
 Called patient to schedule appointment. Left voicemail

## 2023-02-14 NOTE — Telephone Encounter (Signed)
 I will accept her, and she can be seen at the next available new patient appointment.  H Danis

## 2023-02-14 NOTE — Telephone Encounter (Signed)
 If she is specifically requesting Dr. Myrtie Neither it will be up to him CC: Dr. Myrtie Neither

## 2023-04-11 ENCOUNTER — Other Ambulatory Visit: Payer: Self-pay

## 2023-04-25 DIAGNOSIS — Z808 Family history of malignant neoplasm of other organs or systems: Secondary | ICD-10-CM | POA: Insufficient documentation

## 2023-04-25 DIAGNOSIS — D2271 Melanocytic nevi of right lower limb, including hip: Secondary | ICD-10-CM | POA: Insufficient documentation

## 2023-04-25 DIAGNOSIS — D1801 Hemangioma of skin and subcutaneous tissue: Secondary | ICD-10-CM | POA: Insufficient documentation

## 2023-04-25 DIAGNOSIS — L814 Other melanin hyperpigmentation: Secondary | ICD-10-CM | POA: Insufficient documentation

## 2023-04-25 DIAGNOSIS — L821 Other seborrheic keratosis: Secondary | ICD-10-CM | POA: Insufficient documentation

## 2023-04-25 DIAGNOSIS — D225 Melanocytic nevi of trunk: Secondary | ICD-10-CM | POA: Insufficient documentation

## 2023-04-29 ENCOUNTER — Encounter: Payer: Self-pay | Admitting: Gastroenterology

## 2023-04-29 ENCOUNTER — Ambulatory Visit: Payer: Self-pay | Admitting: Gastroenterology

## 2023-04-29 VITALS — BP 130/80 | HR 74 | Ht 64.0 in | Wt 176.0 lb

## 2023-04-29 DIAGNOSIS — Z8719 Personal history of other diseases of the digestive system: Secondary | ICD-10-CM

## 2023-04-29 DIAGNOSIS — R1012 Left upper quadrant pain: Secondary | ICD-10-CM | POA: Diagnosis not present

## 2023-04-29 DIAGNOSIS — R140 Abdominal distension (gaseous): Secondary | ICD-10-CM

## 2023-04-29 DIAGNOSIS — R143 Flatulence: Secondary | ICD-10-CM

## 2023-04-29 NOTE — Patient Instructions (Addendum)
 _______________________________________________________  Food Guidelines for those with chronic digestive trouble:  Many people have difficulty digesting certain foods, causing a variety of distressing and embarrassing symptoms such as abdominal pain, bloating and gas.  These foods may need to be avoided or consumed in small amounts.  Here are some tips that might be helpful for you.  1.   Lactose intolerance is the difficulty or complete inability to digest lactose, the natural sugar in milk and anything made from milk.  This condition is harmless, common, and can begin any time during life.  Some people can digest a modest amount of lactose while others cannot tolerate any.  Also, not all dairy products contain equal amounts of lactose.  For example, hard cheeses such as parmesan have less lactose than soft cheeses such as cheddar.  Yogurt has less lactose than milk or cheese.  Many packaged foods (even many brands of bread) have milk, so read ingredient lists carefully.  It is difficult to test for lactose intolerance, so just try avoiding lactose as much as possible for a week and see what happens with your symptoms.  If you seem to be lactose intolerant, the best plan is to avoid it (but make sure you get calcium from another source).  The next best thing is to use lactase enzyme supplements, available over the counter everywhere.  Just know that many lactose intolerant people need to take several tablets with each serving of dairy to avoid symptoms.  Lastly, a lot of restaurant food is made with milk or butter.  Many are things you might not suspect, such as mashed potatoes, rice and pasta (cooked with butter) and "grilled" items.  If you are lactose intolerant, it never hurts to ask your server what has milk or butter.  2.   Fiber is an important part of your diet, but not all fiber is well-tolerated.  Insoluble fiber such as bran is often consumed by normal gut bacteria and converted into gas.   Soluble fiber such as oats, squash, carrots and green beans are typically tolerated better.  3.   Some types of carbohydrates can be poorly digested.  Examples include: fructose (apples, cherries, pears, raisins and other dried fruits), fructans (onions, zucchini, large amounts of wheat), sorbitol/mannitol/xylitol and sucralose/Splenda (common artificial sweeteners), and raffinose (lentils, broccoli, cabbage, asparagus, brussel sprouts, many types of beans).  Do a Programmer, multimedia for [employer] [place] and you will find helpful information. Beano, a dietary supplement, will often help with raffinose-containing foods.  As with lactase tablets, you may need several per serving.  4.   Whenever possible, avoid processed food&meats and chemical additives.  High fructose corn syrup, a common sweetener, may be difficult to digest.  Eggs and soy (comes from the soybean, and added to many foods now) are other common bloating/gassy foods.  5.  Regarding gluten:  gluten is a protein mainly found in wheat, but also rye and barley.  There is a condition called celiac sprue, which is an inflammatory reaction in the small intestine causing a variety of digestive symptoms.  Blood testing is highly reliable to look for this condition, and sometimes upper endoscopy with small bowel biopsies may be necessary to make the diagnosis.  Many patients who test negative for celiac sprue report improvement in their digestive symptoms when they switch to a gluten-free diet.  However, in these "non-celiac gluten sensitive" patients, the true role of gluten in their symptoms is unclear.  Reducing carbohydrates in general may decrease the gas  and bloating caused when gut bacteria consume carbs. Also, some of these patients may actually be intolerant of the baker's yeast in bread products rather than the gluten.  Flatbread and other reduced yeast breads might therefore be tolerated.  There is no specific testing available for most food intolerances,  which are discovered mainly by dietary elimination.  Please do not embark on a gluten free diet unless directed by your doctor, as it is highly restrictive, and may lead to nutritional deficiencies if not carefully monitored.  Lastly, beware of internet claims offering "personalized" tests for food intolerances.  Such testing has no reliable scientific evidence to support its reliability and correlation to symptoms.    6.  The best advice is old advice, especially for those with chronic digestive trouble - try to eat "clean".  Balanced diet, avoid processed food, plenty of fruits and vegetables, cut down the sugar, minimal alcohol, avoid tobacco. Make time to care for yourself, get enough sleep, exercise when you can, reduce stress.  Your guts will thank you for it.   - Dr. [facility]  ____________________________________________________________  _______________________________________________________  If your blood pressure at your visit was 140/90 or greater, please contact your primary care physician to follow up on this.  _______________________________________________________  If you are age 63 or older, your body mass index should be between 23-30. Your Body mass index is 30.21 kg/m. If this is out of the aforementioned range listed, please consider follow up with your Primary Care Provider.  If you are age 66 or younger, your body mass index should be between 19-25. Your Body mass index is 30.21 kg/m. If this is out of the aformentioned range listed, please consider follow up with your Primary Care Provider.   ________________________________________________________  The [place] [facility] providers would like to encourage you to use [facility] to communicate with providers for non-urgent requests or questions.  Due to long hold times on the telephone, sending your provider a message by [facility] may be a faster and more efficient way to get a response.  Please allow 48  business hours for a response.  Please remember that this is for non-urgent requests.  _______________________________________________________ It was a pleasure to see you today!  Thank you for trusting me with your gastrointestinal care!

## 2023-04-29 NOTE — Progress Notes (Signed)
 [facility] Consult Note:  History: Kim Snow 04/29/2023  Referring provider: Elias Else, MD  Reason for consult/chief complaint: Diverticulitis (Pt states just want to establish care. She states she has no complaints as of today.)   Subjective  Prior history:  February 2020 office note by Dr. Jennye Boroughs PA indicates "small patch of active diverticulitis seen during screening colonoscopy in January 2020".  She was given a 2-week course of Augmentin after that procedure, and the February 2020 visit was given a month-long course of Augmentin as well as a follow-up CT scan.  From PA office note at Dr. Jennye Snow office August 2022: "Kim Snow is a 63 y.o. female, who returns for follow-up of intermittent LUQ pain. She reports that when she is in a particular position with her leg crossed and she leans forward she develops a very sharp LUQ abdominal pain which feels like a knotted sensation just under the left rib cage which she then has to massage. Pain typically resolves in a few minutes with massage and change in position. This may not happen for days at a time and then may occur at random. She has no other additional associated symptoms that occur. She denies pyrosis, regurgitation, belching, burping, early satiety, nausea, vomiting, changes in bowel habits, bloating, melena, or hematochezia. She cannot see bulging in the area of her pain. She had a normal CT abdomen pelvis in January 2022. She did not complete an EGD due to a cancellation from Dr. Kinnie Snow, and then she was exposed to COVID just prior to her second study. She remains on Omeprazole which she feels controls her reflux symptoms very well. 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 The pathology sample clearly suggests that there was kinking of the bowel due to adhesions. She has also had multiple IR placed JP drains due to complicated course of  diverticulitis in 2019. "  From October 2023 office visits in clinic: "Kim Snow returns with periumbilical pain(now resolved) bloating and loose stools. Stools often floating with foul smell and lots of flatus. SIBO vs. EPI vs smoldering diverticulitis discussed today. She will complete a pancreatic elastase test before leaving the office. If normal then consider course of Augmentin to empirically cover both SIBO and Diverticulitis. If continued symptoms then formal HBT to be pursued. "  Fecal elastase was low at 100 and Creon 36,000 was prescribed, 1 capsule with each meal  __________________  Kim Snow want to see me today to establish care for the above issues. Fortunately, she has been feeling generally well from a digestive standpoint, does not feel as if she has had a diverticulitis episode since at least prior to her last visit with the last GI clinic.  She still gets episodes of left upper quadrant pain with bloating and sometimes bowel habits are regular.  She may not go for day and then stool may be firm but then followed by loose stool.  She is still bothered by bloating and gas and is determined that some of her abdominal symptoms are triggered by consuming beef.  She recently started a probiotic and has noticed some more consistent formed of the stool.  No history of smoking, no personal or family history of pancreatitis.  When I noted the prior pedal elastase test and prescription for Creon, she said there was some side effects from the medicine so she stopped it shortly afterward.  She has not had greasy/oily floating stools nor weight  loss. Denies rectal bleeding.  She concurs that her last colonoscopy was in early 2020 as noted above and recalls there were no polyps.  ROS:  Review of Systems  Constitutional:  Negative for appetite change and unexpected weight change.  HENT:  Negative for mouth sores and voice change.   Eyes:  Negative for pain and redness.  Respiratory:   Negative for cough and shortness of breath.   Cardiovascular:  Negative for chest pain and palpitations.  Genitourinary:  Negative for dysuria and hematuria.  Musculoskeletal:  Negative for arthralgias and myalgias.  Skin:  Negative for pallor and rash.  Neurological:  Positive for headaches. Negative for weakness.  Hematological:  Negative for adenopathy.  Psychiatric/Behavioral:         Episodic anxiety     Past Medical History: Past Medical History:  Diagnosis Date   Anemia    Anxiety    Asthma    Depression    GERD (gastroesophageal reflux disease)    Hypertension    Insomnia    Migraine      Past Surgical History: Past Surgical History:  Procedure Laterality Date   CESAREAN SECTION     CHOLECYSTECTOMY     IR RADIOLOGIST EVAL & MGMT  05/20/2017   IR RADIOLOGIST EVAL & MGMT  06/18/2017   IR RADIOLOGIST EVAL & MGMT  07/02/2017   IR RADIOLOGIST EVAL & MGMT  07/16/2017   IR RADIOLOGIST EVAL & MGMT  07/31/2017   IR RADIOLOGIST EVAL & MGMT  08/13/2017   IR RADIOLOGIST EVAL & MGMT  08/26/2017   KNEE ARTHROSCOPY     LAPAROSCOPY N/A 05/22/2017   Procedure: LAPAROSCOPY DIAGNOSTIC, LYSIS OF ADHESIONS, DRAINAGE OF ABCESS, RIGID PROCTOSCOPY, SMALL BOWEL RESECTION X2;  Surgeon: Kinsinger, De Blanch, MD;  Location: [facility];  Service: General;  Laterality: N/A;   Operative report from 05/22/2017 reviewed regarding the following: Postoperative diagnosis: pelvic abscess, small intestine perforation   Procedure: laparoscopic lysis of adhesions, small bowel resection with anastomosis x2, 30cm^2 placement of negative pressure wound dressing, rigid proctoscopy   Surgeon: Kim Snow, M.D.  Op report indicates multiple adhesions of small intestine down to the pelvis and uterus.  Family History: No family history on file.  Social History: Social History   Socioeconomic History   Marital status: Divorced    Spouse name: Not on file   Number of children: 1   Years of education: Not on  file   Highest education level: Not on file  Occupational History   Not on file  Tobacco Use   Smoking status: Never   Smokeless tobacco: Never  Vaping Use   Vaping status: Never Used  Substance and Sexual Activity   Alcohol use: Yes    Comment: social   Drug use: Never   Sexual activity: Not on file  Other Topics Concern   Not on file  Social History Narrative   Not on file   Social Drivers of Health   Financial Resource Strain: Not on file  Food Insecurity: Not on file  Transportation Needs: Not on file  Physical Activity: Not on file  Stress: Not on file  Social Connections: Not on file    Allergies: No Known Allergies  Outpatient Meds: Current Outpatient Medications  Medication Sig Dispense Refill   acetaminophen (TYLENOL) 325 MG tablet Take 2 tablets (650 mg total) by mouth every 6 (six) hours as needed for mild pain (or temp > 100).     ALPRAZolam (XANAX) 1 MG tablet Take 1  mg by mouth at bedtime as needed for sleep.      CALCIUM PO Take 1,000 mg by mouth daily.     Cyanocobalamin (VITAMIN B 12 PO) Take 1 tablet by mouth daily.     losartan-hydrochlorothiazide (HYZAAR) 100-25 MG tablet Take 1 tablet by mouth daily. Pt states she takes a 4th of the medication     omeprazole (PRILOSEC) 40 MG capsule Take 40 mg by mouth daily.     ondansetron (ZOFRAN-ODT) 4 MG disintegrating tablet Take 1 tablet (4 mg total) by mouth every 6 (six) hours as needed for nausea or vomiting. 15 tablet 0   Polysaccharide Iron Complex (IFEREX 150 PO) Take 150 mg by mouth daily.     docusate sodium (COLACE) 100 MG capsule Take 1 capsule (100 mg total) by mouth 2 (two) times daily. (Patient not taking: Reported on 06/10/2017) 30 capsule 0   meloxicam (MOBIC) 15 MG tablet Take 15 mg by mouth daily at 12 noon. (Patient not taking: Reported on 04/29/2023)     oxyCODONE (OXY IR/ROXICODONE) 5 MG immediate release tablet Take 1 tablet (5 mg total) by mouth every 6 (six) hours as needed for severe pain.  (Patient not taking: Reported on 04/29/2023) 20 tablet 0   No current facility-administered medications for this visit.      ___________________________________________________________________ Objective   Exam:  BP 130/80   Pulse 74   Ht 5\' 4"  (1.626 m)   Wt 176 lb (79.8 kg)   BMI 30.21 kg/m  Wt Readings from Last 3 Encounters:  04/29/23 176 lb (79.8 kg)  06/10/17 159 lb 9.8 oz (72.4 kg)  05/22/17 158 lb (71.7 kg)    General: Well-appearing Eyes: sclera anicteric, no redness ENT: oral mucosa moist without lesions, no cervical or supraclavicular lymphadenopathy CV: Regular without appreciable murmur, no JVD, no peripheral edema Resp: clear to auscultation bilaterally, normal RR and effort noted GI: soft, no tenderness, with active bowel sounds. No guarding or palpable organomegaly noted.  No distention, no bruit Skin; warm and dry, no rash or jaundice noted Neuro: awake, alert and oriented x 3. Normal gross motor function and fluent speech     Radiologic Studies:  CLINICAL DATA:  History of diverticulitis.  Upper abdominal pain.   EXAM: CT ABDOMEN AND PELVIS WITH CONTRAST   TECHNIQUE: Multidetector CT imaging of the abdomen and pelvis was performed using the standard protocol following bolus administration of intravenous contrast.   RADIATION DOSE REDUCTION: This exam was performed according to the departmental dose-optimization program which includes automated exposure control, adjustment of the mA and/or kV according to patient size and/or use of iterative reconstruction technique.   CONTRAST:  80mL ISOVUE-300 IOPAMIDOL (ISOVUE-300) INJECTION 61%   COMPARISON:  02/08/2020   FINDINGS: Lower chest: No acute abnormality   Hepatobiliary: No focal liver abnormality is seen. Status post cholecystectomy. No biliary dilatation.   Pancreas: No focal abnormality or ductal dilatation.   Spleen: No focal abnormality.  Normal size.   Adrenals/Urinary Tract: No  adrenal abnormality. No focal renal abnormality. No stones or hydronephrosis. Urinary bladder is unremarkable.   Stomach/Bowel: Extensive left colonic diverticulosis. No active diverticulitis. Stomach and small bowel decompressed, unremarkable. No bowel obstruction.   Vascular/Lymphatic: Aortic atherosclerosis. No evidence of aneurysm or adenopathy.   Reproductive: Uterus and adnexa unremarkable.  No mass.   Other: No free fluid or free air.   Musculoskeletal: No acute bony abnormality.   IMPRESSION: Left colonic diverticulosis.  No active diverticulitis.   Scattered aortic atherosclerosis.  Electronically Signed   By: Charlett Nose M.D.   On: 08/14/2022 00:32     ____________________ 1.  No areas of small bowel dilation or focal narrowing to suggest overt stricture. 2.  No suspicious inflammatory changes in the small or large bowel. 3.  Colonic diverticulosis without active inflammatory changes. Narrative  MR ENTEROGRAPHY WITH AND WITHOUT CONTRAST, 01/03/2021 2:00 PM  INDICATION: LUQ pain, prior severe adhesive disease, r/o small bowel stricture COMPARISON: None.  TECHNIQUE: Multi-planar, multi-sequence MR imaging of the abdomen and pelvis using an MR enterography protocol was performed. Pre and post-contrast images were obtained in addition to cine/multi-phase SSFP and DWI images in the coronal plane.  [facility] and its affiliates are committed to minimizing radiation dose to patients while maintaining necessary diagnostic image quality. All CT scans are therefore performed using "As Low As Reasonably Achievable (ALARA)" protocols with either manual or automated exposure controls calibrated to the age and size of each patient.  Oral contrast: 1000 mL Breeza I.V. contrast: 7.8 mL Gadavist Limitations: Protocol optimized to evaluate bowel.  PROBLEM SPECIFIC FINDINGS:  .  Small bowel: No small bowel dilation, bowel wall thickening, or  overt focal narrowing. .  Colon: Colonic diverticulosis without active inflammatory changes. .  Peritoneum/mesentery/extraperitoneum: No free fluid. No abscess or fistula identified.  ADDITIONAL FINDINGS: .  Liver: Visualized portions are unremarkable. . Gallbladder: Gallbladder was not well-visualized.? Status post cholecystectomy. Please correlate with clinical history. . Biliary tree: No evidence of intra or extrahepatic biliary ductal dilatation. Marland Kitchen  Spleen: Visualized portions are unremarkable. .  Pancreas: Visualized portions are unremarkable. .  Adrenal glands: Visualized portions are unremarkable. .  Kidneys and ureters: Tiny renal hyperintensities likely reflecting benign cysts. No hydronephrosis or hydroureter. .  Bladder: Visualized portions are unremarkable. .  Musculoskeletal: 1.3 cm T2 bright lesion which demonstrate fat saturation arising in L1 vertebra, likely representing a benign hemangioma. Visualized portions are unremarkable. Small sliding hiatal hernia. Procedure Note  Perumpillichira, Lewis Moccasin, MD - 01/03/2021   Encounter Diagnoses  Name Primary?   Abdominal bloating Yes   LUQ pain    History of diverticulitis    Flatulence     Assessment and Plan History of diverticulitis treated with Augmentin based on prior records.  That said, her other more chronic symptoms seem more likely to be some maldigestion with bloating and gas.  Gave her some written dietary advice on where to start regarding common triggers of that. She wanted to establish care with a new GI practice in case she should have an episode of what she feels may be diverticulitis so she could get seen or treated with antibiotics if needed. Previous history of adhesion related small bowel perforation with abscess requiring small bowel resection as noted above.  I read that the entire operative note from Dr. Sheliah Hatch. She could have recurrent adhesions that might also be contributing to some  intermittent bloating and pain, but I would not recommend consideration of repeat surgery at this point given the current status of things.  She was also told from the prior practice that she would need resection for the diverticulitis at some point, but that may not be the case on her current clinical trajectory.  She will see me again as needed, and we will place a recall for screening colonoscopy January 2030  Charlie Pitter III  CC: Referring provider noted above

## 2024-02-06 ENCOUNTER — Emergency Department (HOSPITAL_COMMUNITY)
Admission: EM | Admit: 2024-02-06 | Discharge: 2024-02-06 | Disposition: A | Discharge status: Home / Self Care | Attending: Emergency Medicine | Admitting: Emergency Medicine

## 2024-02-06 ENCOUNTER — Emergency Department (HOSPITAL_COMMUNITY)

## 2024-02-06 ENCOUNTER — Other Ambulatory Visit: Payer: Self-pay

## 2024-02-06 ENCOUNTER — Encounter (HOSPITAL_COMMUNITY): Payer: Self-pay

## 2024-02-06 DIAGNOSIS — J101 Influenza due to other identified influenza virus with other respiratory manifestations: Secondary | ICD-10-CM | POA: Diagnosis not present

## 2024-02-06 DIAGNOSIS — E876 Hypokalemia: Secondary | ICD-10-CM | POA: Insufficient documentation

## 2024-02-06 DIAGNOSIS — Y92010 Kitchen of single-family (private) house as the place of occurrence of the external cause: Secondary | ICD-10-CM | POA: Diagnosis not present

## 2024-02-06 DIAGNOSIS — W010XXA Fall on same level from slipping, tripping and stumbling without subsequent striking against object, initial encounter: Secondary | ICD-10-CM | POA: Diagnosis not present

## 2024-02-06 DIAGNOSIS — S42202A Unspecified fracture of upper end of left humerus, initial encounter for closed fracture: Secondary | ICD-10-CM | POA: Diagnosis not present

## 2024-02-06 DIAGNOSIS — Z79899 Other long term (current) drug therapy: Secondary | ICD-10-CM | POA: Insufficient documentation

## 2024-02-06 DIAGNOSIS — I10 Essential (primary) hypertension: Secondary | ICD-10-CM | POA: Insufficient documentation

## 2024-02-06 DIAGNOSIS — S4992XA Unspecified injury of left shoulder and upper arm, initial encounter: Secondary | ICD-10-CM | POA: Diagnosis present

## 2024-02-06 DIAGNOSIS — S42292A Other displaced fracture of upper end of left humerus, initial encounter for closed fracture: Secondary | ICD-10-CM

## 2024-02-06 LAB — COMPREHENSIVE METABOLIC PANEL WITH GFR
ALT: 23 U/L (ref 0–44)
AST: 32 U/L (ref 15–41)
Albumin: 4 g/dL (ref 3.5–5.0)
Alkaline Phosphatase: 108 U/L (ref 38–126)
Anion gap: 14 (ref 5–15)
BUN: 16 mg/dL (ref 8–23)
CO2: 26 mmol/L (ref 22–32)
Calcium: 8.8 mg/dL — ABNORMAL LOW (ref 8.9–10.3)
Chloride: 95 mmol/L — ABNORMAL LOW (ref 98–111)
Creatinine, Ser: 0.91 mg/dL (ref 0.44–1.00)
GFR, Estimated: >60 mL/min
Glucose, Bld: 106 mg/dL — ABNORMAL HIGH (ref 70–99)
Potassium: 3.2 mmol/L — ABNORMAL LOW (ref 3.5–5.1)
Sodium: 135 mmol/L (ref 135–145)
Total Bilirubin: 0.3 mg/dL (ref 0.0–1.2)
Total Protein: 7.5 g/dL (ref 6.5–8.1)

## 2024-02-06 LAB — CBC WITH DIFFERENTIAL/PLATELET
Abs Immature Granulocytes: 0.02 K/uL (ref 0.00–0.07)
Basophils Absolute: 0 K/uL (ref 0.0–0.1)
Basophils Relative: 0 %
Eosinophils Absolute: 0 K/uL (ref 0.0–0.5)
Eosinophils Relative: 0 %
HCT: 39.5 % (ref 36.0–46.0)
Hemoglobin: 12.9 g/dL (ref 12.0–15.0)
Immature Granulocytes: 0 %
Lymphocytes Relative: 15 %
Lymphs Abs: 0.9 K/uL (ref 0.7–4.0)
MCH: 28.2 pg (ref 26.0–34.0)
MCHC: 32.7 g/dL (ref 30.0–36.0)
MCV: 86.4 fL (ref 80.0–100.0)
Monocytes Absolute: 0.5 K/uL (ref 0.1–1.0)
Monocytes Relative: 10 %
Neutro Abs: 4.2 K/uL (ref 1.7–7.7)
Neutrophils Relative %: 75 %
Platelets: 321 K/uL (ref 150–400)
RBC: 4.57 MIL/uL (ref 3.87–5.11)
RDW: 12.6 % (ref 11.5–15.5)
WBC: 5.7 K/uL (ref 4.0–10.5)
nRBC: 0 % (ref 0.0–0.2)

## 2024-02-06 LAB — RESP PANEL BY RT-PCR (RSV, FLU A&B, COVID)  RVPGX2
Influenza A by PCR: POSITIVE — AB
Influenza B by PCR: NEGATIVE
Resp Syncytial Virus by PCR: NEGATIVE
SARS Coronavirus 2 by RT PCR: NEGATIVE

## 2024-02-06 MED ORDER — METOCLOPRAMIDE HCL 10 MG PO TABS: 10.0000 mg | ORAL_TABLET | Freq: Four times a day (QID) | ORAL | 0 refills | Status: AC

## 2024-02-06 MED ORDER — ONDANSETRON HCL 4 MG/2ML IJ SOLN
4.0000 mg | Freq: Once | INTRAMUSCULAR | Status: AC
  Administered 2024-02-06: 4 mg via INTRAVENOUS
  Filled 2024-02-06: qty 2

## 2024-02-06 MED ORDER — POTASSIUM CHLORIDE CRYS ER 20 MEQ PO TBCR
40.0000 meq | EXTENDED_RELEASE_TABLET | Freq: Once | ORAL | Status: AC
  Administered 2024-02-06: 40 meq via ORAL
  Filled 2024-02-06: qty 2

## 2024-02-06 MED ORDER — SENNOSIDES-DOCUSATE SODIUM 8.6-50 MG PO TABS: 1.0000 | ORAL_TABLET | Freq: Every day | ORAL | 0 refills | Status: AC

## 2024-02-06 MED ORDER — OXYCODONE HCL 5 MG PO TABS: 5.0000 mg | ORAL_TABLET | Freq: Four times a day (QID) | ORAL | 0 refills | Status: AC | PRN

## 2024-02-06 MED ORDER — SODIUM CHLORIDE 0.9 % IV BOLUS
1000.0000 mL | Freq: Once | INTRAVENOUS | Status: AC
  Administered 2024-02-06: 1000 mL via INTRAVENOUS

## 2024-02-06 MED ORDER — MORPHINE SULFATE (PF) 4 MG/ML IV SOLN
4.0000 mg | Freq: Once | INTRAVENOUS | Status: AC
  Administered 2024-02-06: 4 mg via INTRAVENOUS
  Filled 2024-02-06: qty 1

## 2024-02-06 MED ORDER — METOCLOPRAMIDE HCL 5 MG/ML IJ SOLN
10.0000 mg | Freq: Once | INTRAMUSCULAR | Status: AC
  Administered 2024-02-06: 10 mg via INTRAVENOUS
  Filled 2024-02-06: qty 2

## 2024-02-06 NOTE — ED Provider Notes (Signed)
 "  EMERGENCY DEPARTMENT AT [facility] Provider Note   CSN: [id number] Arrival date & time: 02/06/24  9268     Patient presents with: Shoulder Injury   Kim Snow is a 63 y.o. female.   Patient is a 63 year old female with past medical history of hypertension and GERD presenting to the emergency department with syncope and left shoulder pain.  Patient states that she has been sick for the last several days with nausea, vomiting, diarrhea, cough and congestion.  She states that she has not been able to keep much down.  She states that last night she was going into the kitchen and was feeling dizzy and thinks that she tripped over her cat and she fell onto her left shoulder.  She states that she did initially pass out.  States that she got up and tried to go back to the bedroom and passed out again on the way to the bedroom.  She thinks that she may have hit her head but denies any headache or moves her head.  The patient did have significant shoulder pain with fall.  She states that she is having some tingling in her left 3rd through 5th digits and denies any other numbness or weakness.  She denies any blood thinner use.  The history is provided by the patient.  Shoulder Injury       Prior to Admission medications  Medication Sig Start Date End Date Taking? Authorizing Provider  metoCLOPramide  (REGLAN ) 10 MG tablet Take 1 tablet (10 mg total) by mouth every 6 (six) hours. 02/06/24  Yes Ellouise, Mitchel Delduca K, DO  oxyCODONE  (ROXICODONE ) 5 MG immediate release tablet Take 1 tablet (5 mg total) by mouth every 6 (six) hours as needed. 02/06/24  Yes Ellouise, Jakori Burkett K, DO  senna-docusate (SENOKOT-S) 8.6-50 MG tablet Take 1 tablet by mouth daily. 02/06/24  Yes Ellouise, Paz Fuentes K, DO  acetaminophen  (TYLENOL ) 325 MG tablet Take 2 tablets (650 mg total) by mouth every 6 (six) hours as needed for mild pain (or temp > 100). 05/11/17   Signe Mitzie LABOR, MD  ALPRAZolam   (XANAX ) 1 MG tablet Take 1 mg by mouth at bedtime as needed for sleep.  02/11/17   [provider]  CALCIUM PO Take 1,000 mg by mouth daily.    [provider]  Cyanocobalamin (VITAMIN B 12 PO) Take 1 tablet by mouth daily.    [provider]  docusate sodium  (COLACE) 100 MG capsule Take 1 capsule (100 mg total) by mouth 2 (two) times daily. Patient not taking: Reported on 06/10/2017 05/11/17   Signe Mitzie LABOR, MD  losartan -hydrochlorothiazide  (HYZAAR) 100-25 MG tablet Take 1 tablet by mouth daily. Pt states she takes a 4th of the medication 03/11/17   [provider]  meloxicam (MOBIC) 15 MG tablet Take 15 mg by mouth daily at 12 noon. Patient not taking: Reported on 04/29/2023 04/21/17   [provider]  omeprazole (PRILOSEC) 40 MG capsule Take 40 mg by mouth daily. 03/28/17   [provider]  ondansetron  (ZOFRAN -ODT) 4 MG disintegrating tablet Take 1 tablet (4 mg total) by mouth every 6 (six) hours as needed for nausea or vomiting. 06/12/17   Meuth, Lyle LABOR, PA-C  Polysaccharide Iron  Complex (IFEREX 150 PO) Take 150 mg by mouth daily.    [provider]    Allergies: Patient has no known allergies.    Review of Systems  Updated Vital Signs BP 112/73   Pulse 71  Temp 97.7 F (36.5 C) (Oral)   Resp (!) 21   Ht 5' 4 (1.626 m)   Wt 74.8 kg   SpO2 95%   BMI 28.32 kg/m   Physical Exam Vitals and nursing note reviewed.  Constitutional:      General: She is not in acute distress.    Appearance: Normal appearance.  HENT:     Head: Normocephalic and atraumatic.     Nose: Nose normal.     Mouth/Throat:     Mouth: Mucous membranes are dry.     Pharynx: Oropharynx is clear.  Eyes:     Extraocular Movements: Extraocular movements intact.     Conjunctiva/sclera: Conjunctivae normal.  Neck:     Comments: No midline neck tenderness Cardiovascular:     Rate and Rhythm: Normal rate and regular rhythm.     Pulses: Normal pulses.      Heart sounds: Normal heart sounds.  Pulmonary:     Effort: Pulmonary effort is normal.     Breath sounds: Normal breath sounds.  Abdominal:     General: Abdomen is flat.     Palpations: Abdomen is soft.     Tenderness: There is no abdominal tenderness.  Musculoskeletal:     Cervical back: Normal range of motion and neck supple. No tenderness.     Comments: Tenderness to palpation of left lateral shoulder with limited ROM secondary to pain, no tenderness in left elbow, wrist or hand No tenderness to right upper extremity or bilateral lower extremities Pelvis stable, nontender  Skin:    General: Skin is warm and dry.  Neurological:     General: No focal deficit present.     Mental Status: She is alert and oriented to person, place, and time.     Sensory: No sensory deficit.     Motor: No weakness.  Psychiatric:        Mood and Affect: Mood normal.        Behavior: Behavior normal.     (all labs ordered are listed, but only abnormal results are displayed) Labs Reviewed  RESP PANEL BY RT-PCR (RSV, FLU A&B, COVID)  RVPGX2 - Abnormal; Notable for the following components:      Result Value   Influenza A by PCR POSITIVE (*)    All other components within normal limits  COMPREHENSIVE METABOLIC PANEL WITH GFR - Abnormal; Notable for the following components:   Potassium 3.2 (*)    Chloride 95 (*)    Glucose, Bld 106 (*)    Calcium 8.8 (*)    All other components within normal limits  CBC WITH DIFFERENTIAL/PLATELET    EKG: EKG Interpretation Date/Time:  Friday February 06 2024 07:49:42 EST Ventricular Rate:  108 PR Interval:  166 QRS Duration:  133 QT Interval:  357 QTC Calculation: 479 R Axis:   61  Text Interpretation: Sinus tachycardia Right atrial enlargement IVCD, consider atypical LBBB No significant change since last tracing Confirmed by Ellouise Fine ([id number]) on 02/06/2024 8:05:59 AM  Radiology: [facility] Shoulder Left Port Result Date: 02/06/2024 EXAM: 1 VIEW(S)  XRAY OF THE LEFT SHOULDER 02/06/2024 10:23:00 AM COMPARISON: None available. CLINICAL HISTORY: Shoulder injury FINDINGS: BONES AND JOINTS: Comminuted, impacted fracture of the left proximal humerus. Fracture lines extend through the surgical neck and greater tuberosity. The [facility] joint is unremarkable. SOFT TISSUES: Extensive surrounding soft tissue swelling. No abnormal calcifications. Visualized lung is unremarkable. IMPRESSION: 1. Comminuted, impacted fracture of the left proximal humerus extending through the surgical neck and greater  tuberosity. 2. Extensive surrounding soft tissue swelling. Electronically signed by: Ryan Chess MD 02/06/2024 11:10 AM EST RP Workstation: [id number]     Procedures   Medications Ordered in the ED  metoCLOPramide  (REGLAN ) injection 10 mg (has no administration in time range)  sodium chloride  0.9 % bolus 1,000 mL (0 mLs Intravenous Stopped 02/06/24 1137)  morphine  (PF) 4 MG/ML injection 4 mg (4 mg Intravenous Given 02/06/24 1042)  ondansetron  (ZOFRAN ) injection 4 mg (4 mg Intravenous Given 02/06/24 1043)  potassium chloride  SA (KLOR-CON  M) CR tablet 40 mEq (40 mEq Oral Given 02/06/24 1137)  ondansetron  (ZOFRAN ) injection 4 mg (4 mg Intravenous Given 02/06/24 1136)    Clinical Course as of 02/06/24 1251  Fri Feb 06, 2024  1057 XR with humeral head fracture. Patient will be placed in sling.  [VK]  1141 Influenza A positive. Patient reporting improvement of symptoms. Will have PO trial and ambulatory trial.  [VK]  1243 Upon reassessment, patient still feels nauseous but has had no vomiting and is tolerating water. She was able to ambulate steadily in the room. She is comfortable with discharge home and recommended PCP and ortho follow up. [VK]    Clinical Course User Index [VK] Kingsley, Tyreke Kaeser K, DO                                 Medical Decision Making This patient presents to the ED with chief complaint(s) of dizziness/fall with pertinent past medical  history of hypertension, GERD which further complicates the presenting complaint. The complaint involves an extensive differential diagnosis and also carries with it a high risk of complications and morbidity.    The differential diagnosis includes viral syndrome, dehydration, electrolyte abnormality, arrhythmia, anemia, shoulder fracture, dislocation, sprain, ligamentous injury, no other traumatic injury seen on exam, she is low risk by [place] head CT rule making ICH or mass effect unlikely  Additional history obtained: Additional history obtained from N/A Records reviewed N/A  ED Course and Reassessment: On patient's arrival she is mildly tachycardic and otherwise hemodynamically stable in no acute distress.  EKG on arrival showed normal sinus rhythm without acute ischemic changes.  Patient will have labs to evaluate for dehydration or viral syndrome as well as x-ray imaging to evaluate for injury.  She was started on fluids and given pain and nausea control and will be closely reassessed.  Independent labs interpretation:  The following labs were independently interpreted: Flu A positive, mild hypokalemia  Independent visualization of imaging: - I independently visualized the following imaging with scope of interpretation limited to determining acute life threatening conditions related to emergency care: R shoulder XR, which revealed humeral head Fx  Consultation: - Consulted or discussed management/test interpretation w/ external professional: N/A  Consideration for admission or further workup: Patient has no emergent conditions requiring admission or further work-up at this time and is stable for discharge home with primary care and ortho follow-up  Social Determinants of health: N/A    Amount and/or Complexity of Data Reviewed Labs: ordered. Radiology: ordered.  Risk OTC drugs. Prescription drug management.       Final diagnoses:  Influenza A  Closed fracture of head  of left humerus, initial encounter    ED Discharge Orders          Ordered    oxyCODONE  (ROXICODONE ) 5 MG immediate release tablet  Every 6 hours PRN        02/06/24 1246  metoCLOPramide  (REGLAN ) 10 MG tablet  Every 6 hours        02/06/24 1246    senna-docusate (SENOKOT-S) 8.6-50 MG tablet  Daily        02/06/24 1246               Kingsley, Eldwin Volkov K, DO 02/06/24 1251  "

## 2024-02-06 NOTE — Discharge Instructions (Addendum)
 You were seen in the emergency department after your fall.  You did break your shoulder and we have placed you in a sling.  You should wear this at all times except showering and should follow-up with orthopedics for further treatment.  You can ice your shoulder and take Tylenol  and Motrin  every 6 hours as needed for pain and I am giving you oxycodone  for breakthrough pain.  This can make you constipated so I recommend taking a stool softener with that and it can make you drowsy so do not take it while driving, working or operating heavy machinery.  We also tested positive for the flu here and were mildly dehydrated.  You should make sure that you are drinking plenty of fluids to stay well-hydrated and can follow-up with your primary doctor to have your flu symptoms rechecked.  You should return to the emergency department if you are having uncontrollable pain, worsening dizziness and pass out or any other new or concerning symptoms.

## 2024-02-06 NOTE — ED Notes (Signed)
Pt had a cup of soda done well 

## 2024-02-06 NOTE — ED Triage Notes (Signed)
 Pt tripped over cat last night and injured left shoulder. Pt unable to rotate shoulder or lift arm. Pt states she passed out twice due to her pain. Denies blood thinners.

## 2024-02-06 NOTE — Progress Notes (Signed)
 Orthopedic Tech Progress Note Patient Details:  Kim Snow [birth year] [id number]  Ortho Devices Type of Ortho Device: Shoulder immobilizer Ortho Device/Splint Location: LUE Ortho Device/Splint Interventions: Application   Post Interventions Patient Tolerated: Well  Massie BRAVO Airam Runions 02/06/2024, 12:27 PM

## 2024-02-09 ENCOUNTER — Other Ambulatory Visit: Payer: Self-pay | Admitting: Orthopedic Surgery

## 2024-02-09 DIAGNOSIS — M25512 Pain in left shoulder: Secondary | ICD-10-CM

## 2024-02-10 ENCOUNTER — Encounter: Payer: Self-pay | Admitting: Orthopedic Surgery

## 2024-02-10 ENCOUNTER — Ambulatory Visit
Admission: RE | Admit: 2024-02-10 | Discharge: 2024-02-10 | Disposition: A | Discharge status: Home / Self Care | Source: Ambulatory Visit | Attending: Orthopedic Surgery | Admitting: Orthopedic Surgery

## 2024-02-10 DIAGNOSIS — M25512 Pain in left shoulder: Secondary | ICD-10-CM

## 2024-02-11 ENCOUNTER — Other Ambulatory Visit: Payer: Self-pay | Admitting: Orthopedic Surgery

## 2024-02-11 NOTE — Patient Instructions (Signed)
 SURGICAL WAITING ROOM VISITATION  Patients having surgery or a procedure may have no more than 2 support people in the waiting area - these visitors may rotate.    Children ages 18 and under will not be able to visit patients in [facility] under most circumstances.   Visitors with respiratory illnesses are discouraged from visiting and should remain at home.  If the patient needs to stay at the hospital during part of their recovery, the visitor guidelines for inpatient rooms apply. Pre-op nurse will coordinate an appropriate time for 1 support person to accompany patient in pre-op.  This support person may not rotate.    Please refer to the [facility] website for the visitor guidelines for Inpatients (after your surgery is over and you are in a regular room).    Your procedure is scheduled on: 02/17/24   Report to [facility] Main Entrance    Report to admitting at 5:15 AM   Call this number if you have problems the morning of surgery [phone]   Do not eat food :After Midnight.   After Midnight you may have the following liquids until 4:30 AM DAY OF SURGERY  Water Non-Citrus Juices (without pulp, NO RED-Apple, White grape, White cranberry) Black Coffee (NO MILK/CREAM OR CREAMERS, sugar ok)  Clear Tea (NO MILK/CREAM OR CREAMERS, sugar ok) regular and decaf                             Plain Jell-O (NO RED)                                           Fruit ices (not with fruit pulp, NO RED)                                     Popsicles (NO RED)                                                               Sports drinks like Gatorade (NO RED)                     If you have questions, please contact your surgeons office.   FOLLOW BOWEL PREP AND ANY ADDITIONAL PRE OP INSTRUCTIONS YOU RECEIVED FROM YOUR SURGEON'S OFFICE!!!     Oral Hygiene is also important to reduce your risk of infection.                                    Remember - BRUSH YOUR TEETH THE  MORNING OF SURGERY WITH YOUR REGULAR TOOTHPASTE  DENTURES WILL BE REMOVED PRIOR TO SURGERY PLEASE DO NOT APPLY Poly grip OR ADHESIVES!!!   Stop all vitamins and herbal supplements 7 days before surgery.   Take these medicines the morning of surgery with A SIP OF WATER: Tylenol , Omeprazole              You may not have any metal on your body including  hair pins, jewelry, and body piercing             Do not wear make-up, lotions, powders, perfumes, or deodorant  Do not wear nail polish including gel and S&S, artificial/acrylic nails, or any other type of covering on natural nails including finger and toenails. If you have artificial nails, gel coating, etc. that needs to be removed by a nail salon please have this removed prior to surgery or surgery may need to be canceled/ delayed if the surgeon/ anesthesia feels like they are unable to be safely monitored.   Do not shave  48 hours prior to surgery.    Do not bring valuables to the hospital. [facility] IS NOT             RESPONSIBLE   FOR VALUABLES.   Contacts, glasses, dentures or bridgework may not be worn into surgery.  DO NOT BRING YOUR HOME MEDICATIONS TO THE HOSPITAL. PHARMACY WILL DISPENSE MEDICATIONS LISTED ON YOUR MEDICATION LIST TO YOU DURING YOUR ADMISSION IN THE HOSPITAL!    Patients discharged on the day of surgery will not be allowed to drive home.  Someone NEEDS to stay with you for the first 24 hours after anesthesia.   Special Instructions: Bring a copy of your healthcare power of attorney and living will documents the day of surgery if you haven't scanned them before.              Please read over the following fact sheets you were given: IF YOU HAVE QUESTIONS ABOUT YOUR PRE-OP INSTRUCTIONS PLEASE CALL [phone] [place].   If you received a COVID test during your pre-op visit  it is requested that you wear a mask when out in public, stay away from anyone that may not be feeling well and notify your surgeon if  you develop symptoms. If you test positive for Covid or have been in contact with anyone that has tested positive in the last 10 days please notify you surgeon.      Pre-operative 4 CHG Bath Instructions  DYNA-Hex 4 Chlorhexidine Gluconate 4% Solution Antiseptic 4 fl. oz   You can play a key role in reducing the risk of infection after surgery. Your skin needs to be as free of germs as possible. You can reduce the number of germs on your skin by washing with CHG (chlorhexidine gluconate) soap before surgery. CHG is an antiseptic soap that kills germs and continues to kill germs even after washing.   DO NOT use if you have an allergy to chlorhexidine/CHG or antibacterial soaps. If your skin becomes reddened or irritated, stop using the CHG and notify one of our RNs at   Please shower with the CHG soap starting 4 days before surgery using the following schedule:     Please keep in mind the following:  DO NOT shave, including legs and underarms, starting the day of your first shower.   You may shave your face at any point before/day of surgery.  Place clean sheets on your bed the day you start using CHG soap. Use a clean washcloth (not used since being washed) for each shower. DO NOT sleep with pets once you start using the CHG.  CHG Shower Instructions:  If you choose to wash your hair and private area, wash first with your normal shampoo/soap.  After you use shampoo/soap, rinse your hair and body thoroughly to remove shampoo/soap residue.  Turn the water OFF and apply about 3 tablespoons (45 ml) of CHG  soap to a CLEAN washcloth.  Apply CHG soap ONLY FROM YOUR NECK DOWN TO YOUR TOES (washing for 3-5 minutes)  DO NOT use CHG soap on face, private areas, open wounds, or sores.  Pay special attention to the area where your surgery is being performed.  If you are having back surgery, having someone wash your back for you may be helpful. Wait 2 minutes after CHG soap is applied, then you may  rinse off the CHG soap.  Pat dry with a clean towel  Put on clean clothes/pajamas   If you choose to wear lotion, please use ONLY the CHG-compatible lotions on the back of this paper.     Additional instructions for the day of surgery: DO NOT APPLY any lotions, deodorants, cologne, or perfumes.   Put on clean/comfortable clothes.  Brush your teeth.  Ask your nurse before applying any prescription medications to the skin.   CHG Compatible Lotions   Aveeno Moisturizing lotion  Cetaphil Moisturizing Cream  Cetaphil Moisturizing Lotion  Clairol Herbal Essence Moisturizing Lotion, Dry Skin  Clairol Herbal Essence Moisturizing Lotion, Extra Dry Skin  Clairol Herbal Essence Moisturizing Lotion, Normal Skin  Curel Age Defying Therapeutic Moisturizing Lotion with Alpha Hydroxy  Curel Extreme Care Body Lotion  Curel Soothing Hands Moisturizing Hand Lotion  Curel Therapeutic Moisturizing Cream, Fragrance-Free  Curel Therapeutic Moisturizing Lotion, Fragrance-Free  Curel Therapeutic Moisturizing Lotion, Original Formula  Eucerin Daily Replenishing Lotion  Eucerin Dry Skin Therapy Plus Alpha Hydroxy Crme  Eucerin Dry Skin Therapy Plus Alpha Hydroxy Lotion  Eucerin Original Crme  Eucerin Original Lotion  Eucerin Plus Crme Eucerin Plus Lotion  Eucerin TriLipid Replenishing Lotion  Keri Anti-Bacterial Hand Lotion  Keri Deep Conditioning Original Lotion Dry Skin Formula Softly Scented  Keri Deep Conditioning Original Lotion, Fragrance Free Sensitive Skin Formula  Keri Lotion Fast Absorbing Fragrance Free Sensitive Skin Formula  Keri Lotion Fast Absorbing Softly Scented Dry Skin Formula  Keri Original Lotion  Keri Skin Renewal Lotion Keri Silky Smooth Lotion  Keri Silky Smooth Sensitive Skin Lotion  Nivea Body Creamy Conditioning Oil  Nivea Body Extra Enriched Teacher, Adult Education Moisturizing Lotion Nivea Crme  Nivea Skin Firming Lotion  NutraDerm 30  Skin Lotion  NutraDerm Skin Lotion  NutraDerm Therapeutic Skin Cream  NutraDerm Therapeutic Skin Lotion  ProShield Protective Hand Cream  Provon moisturizing lotion

## 2024-02-11 NOTE — Progress Notes (Signed)
 PST appointment completed over the phone. Patient instructed to use Dial soap at home if she cannot get CHG. Emailed instructions per pt preference.Transferred call to admitting.  Date of COVID positive in last 90 days:  PCP - Worth Moloney, PA- seeing 02/13/24 at 1400 Cardiologist -   Chest x-ray - N/A EKG - 02/09/24 Epic tachycardic at 129 Stress Test - N/A ECHO - N/A Cardiac Cath - N/A Pacemaker/ICD device last checked:N/A Spinal Cord Stimulator:N/A  Bowel Prep - N/A  Sleep Study - N/A CPAP -   Fasting Blood Sugar - N/A Checks Blood Sugar _____ times a day  Last dose of GLP1 agonist-  N/A GLP1 instructions:  Do not take after     Last dose of SGLT-2 inhibitors-  N/A SGLT-2 instructions:  Do not take after     Blood Thinner Instructions: N/A Last dose:   Time: Aspirin Instructions:N/A Last Dose:  Activity level: Can go up a flight of stairs and perform activities of daily living without stopping and without symptoms of chest pain or shortness of breath.   Anesthesia review: had flu 2 weeks ago, last symptoms 02/04/24. Patient states that is why she is having surgery at the hospital rather than surgical center  Patient denies shortness of breath, fever, cough and chest pain at PAT appointment  Patient verbalized understanding of instructions that were given to them at the PAT appointment. Patient was also instructed that they will need to review over the PAT instructions again at home before surgery.

## 2024-02-11 NOTE — Progress Notes (Signed)
 Please place orders for PAT appointment scheduled 02/13/24.

## 2024-02-13 ENCOUNTER — Encounter (HOSPITAL_COMMUNITY)
Admission: RE | Admit: 2024-02-13 | Discharge: 2024-02-13 | Disposition: A | Source: Ambulatory Visit | Attending: Orthopedic Surgery

## 2024-02-13 ENCOUNTER — Other Ambulatory Visit: Payer: Self-pay

## 2024-02-13 ENCOUNTER — Other Ambulatory Visit: Payer: Self-pay | Admitting: Orthopedic Surgery

## 2024-02-13 ENCOUNTER — Encounter (HOSPITAL_COMMUNITY): Payer: Self-pay

## 2024-02-16 NOTE — Anesthesia Preprocedure Evaluation (Addendum)
"                                    Anesthesia Evaluation  Patient identified by MRN, date of birth, ID band Patient awake    Reviewed: Allergy & Precautions, H&P , NPO status , Patient's Chart, lab work & pertinent test results  History of Anesthesia Complications Negative for: history of anesthetic complications  Airway Mallampati: II  TM Distance: >3 FB Neck ROM: Full    Dental no notable dental hx.    Pulmonary neg pulmonary ROS, asthma    Pulmonary exam normal breath sounds clear to auscultation       Cardiovascular hypertension, negative cardio ROS Normal cardiovascular exam Rhythm:Regular Rate:Normal     Neuro/Psych  Headaches, neg Seizures PSYCHIATRIC DISORDERS Anxiety Depression    negative neurological ROS  negative psych ROS   GI/Hepatic negative GI ROS, Neg liver ROS,GERD  ,,  Endo/Other  negative endocrine ROS    Renal/GU negative Renal ROS  negative genitourinary   Musculoskeletal  (+) Arthritis ,  Prox humerus fx   Abdominal   Peds negative pediatric ROS (+)  Hematology negative hematology ROS (+)   Anesthesia Other Findings   Reproductive/Obstetrics negative OB ROS                              Anesthesia Physical Anesthesia Plan  ASA: 2  Anesthesia Plan: Regional and General   Post-op Pain Management: Tylenol  PO (pre-op)* and Regional block*   Induction: Intravenous  PONV Risk Score and Plan: 3 and Ondansetron  and Dexamethasone   Airway Management Planned: Oral ETT  Additional Equipment: None  Intra-op Plan:   Post-operative Plan: Extubation in OR  Informed Consent: I have reviewed the patients History and Physical, chart, labs and discussed the procedure including the risks, benefits and alternatives for the proposed anesthesia with the patient or authorized representative who has indicated his/her understanding and acceptance.     Dental advisory given  Plan Discussed with:  CRNA  Anesthesia Plan Comments:          Anesthesia Quick Evaluation  "

## 2024-02-17 ENCOUNTER — Ambulatory Visit (HOSPITAL_COMMUNITY)

## 2024-02-17 ENCOUNTER — Ambulatory Visit (HOSPITAL_COMMUNITY)
Admission: RE | Admit: 2024-02-17 | Discharge: 2024-02-17 | Disposition: A | Discharge status: Home / Self Care | Source: Ambulatory Visit | Attending: Orthopedic Surgery | Admitting: Orthopedic Surgery

## 2024-02-17 ENCOUNTER — Encounter (HOSPITAL_COMMUNITY): Payer: Self-pay | Admitting: Medical

## 2024-02-17 ENCOUNTER — Encounter: Admission: RE | Payer: Self-pay

## 2024-02-17 ENCOUNTER — Encounter (HOSPITAL_COMMUNITY): Admission: RE | Disposition: A | Payer: Self-pay | Source: Ambulatory Visit | Attending: Orthopedic Surgery

## 2024-02-17 ENCOUNTER — Encounter (HOSPITAL_COMMUNITY): Payer: Self-pay | Admitting: Orthopedic Surgery

## 2024-02-17 DIAGNOSIS — W19XXXA Unspecified fall, initial encounter: Secondary | ICD-10-CM | POA: Insufficient documentation

## 2024-02-17 DIAGNOSIS — S42292A Other displaced fracture of upper end of left humerus, initial encounter for closed fracture: Secondary | ICD-10-CM | POA: Insufficient documentation

## 2024-02-17 DIAGNOSIS — F419 Anxiety disorder, unspecified: Secondary | ICD-10-CM | POA: Diagnosis not present

## 2024-02-17 DIAGNOSIS — S42202A Unspecified fracture of upper end of left humerus, initial encounter for closed fracture: Secondary | ICD-10-CM

## 2024-02-17 DIAGNOSIS — J45909 Unspecified asthma, uncomplicated: Secondary | ICD-10-CM | POA: Insufficient documentation

## 2024-02-17 DIAGNOSIS — F32A Depression, unspecified: Secondary | ICD-10-CM | POA: Diagnosis not present

## 2024-02-17 DIAGNOSIS — I10 Essential (primary) hypertension: Secondary | ICD-10-CM | POA: Insufficient documentation

## 2024-02-17 HISTORY — PX: REVERSE SHOULDER ARTHROPLASTY: SHX5054

## 2024-02-17 HISTORY — PX: ORIF HUMERUS FRACTURE: SHX2126

## 2024-02-17 SURGERY — OPEN REDUCTION INTERNAL FIXATION (ORIF) PROXIMAL HUMERUS FRACTURE
Anesthesia: Regional | Site: Shoulder | Laterality: Left

## 2024-02-17 SURGERY — ARTHROPLASTY, SHOULDER, TOTAL, REVERSE
Anesthesia: Choice | Site: Shoulder | Laterality: Left

## 2024-02-17 MED ORDER — OXYCODONE HCL 5 MG PO TABS: 5.0000 mg | ORAL_TABLET | Freq: Once | ORAL | Status: DC | PRN

## 2024-02-17 MED ORDER — POVIDONE-IODINE 7.5 % EX SOLN: Freq: Once | CUTANEOUS | Status: DC

## 2024-02-17 MED ORDER — ORAL CARE MOUTH RINSE: 15.0000 mL | Freq: Once | OROMUCOSAL | Status: AC

## 2024-02-17 MED ORDER — ROCURONIUM BROMIDE 10 MG/ML (PF) SYRINGE
PREFILLED_SYRINGE | INTRAVENOUS | Status: DC | PRN
  Administered 2024-02-17: 50 mg via INTRAVENOUS

## 2024-02-17 MED ORDER — WATER FOR IRRIGATION, STERILE IR SOLN
Status: DC | PRN
  Administered 2024-02-17: 1000 mL

## 2024-02-17 MED ORDER — FENTANYL CITRATE (PF) 100 MCG/2ML IJ SOLN
INTRAMUSCULAR | Status: AC
  Filled 2024-02-17: qty 2

## 2024-02-17 MED ORDER — MIDAZOLAM HCL 5 MG/5ML IJ SOLN
INTRAMUSCULAR | Status: DC | PRN
  Administered 2024-02-17: 2 mg via INTRAVENOUS

## 2024-02-17 MED ORDER — CHLORHEXIDINE GLUCONATE 0.12 % MT SOLN
15.0000 mL | Freq: Once | OROMUCOSAL | Status: AC
  Administered 2024-02-17: 15 mL via OROMUCOSAL

## 2024-02-17 MED ORDER — LIDOCAINE HCL (PF) 2 % IJ SOLN
INTRAMUSCULAR | Status: AC
  Filled 2024-02-17: qty 5

## 2024-02-17 MED ORDER — DROPERIDOL 2.5 MG/ML IJ SOLN: 0.6250 mg | Freq: Once | INTRAMUSCULAR | Status: DC | PRN

## 2024-02-17 MED ORDER — OXYCODONE HCL 5 MG/5ML PO SOLN: 5.0000 mg | Freq: Once | ORAL | Status: DC | PRN

## 2024-02-17 MED ORDER — SUGAMMADEX SODIUM 200 MG/2ML IV SOLN
INTRAVENOUS | Status: DC | PRN
  Administered 2024-02-17: 200 mg via INTRAVENOUS

## 2024-02-17 MED ORDER — BUPIVACAINE LIPOSOME 1.3 % IJ SUSP
INTRAMUSCULAR | Status: DC | PRN
  Administered 2024-02-17: 10 mL via PERINEURAL

## 2024-02-17 MED ORDER — ONDANSETRON HCL 4 MG/2ML IJ SOLN
INTRAMUSCULAR | Status: AC
  Filled 2024-02-17: qty 2

## 2024-02-17 MED ORDER — METHOCARBAMOL 500 MG PO TABS: 500.0000 mg | ORAL_TABLET | Freq: Four times a day (QID) | ORAL | 0 refills | Status: AC | PRN

## 2024-02-17 MED ORDER — DEXAMETHASONE SOD PHOSPHATE PF 10 MG/ML IJ SOLN
INTRAMUSCULAR | Status: DC | PRN
  Administered 2024-02-17: 10 mg via INTRAVENOUS

## 2024-02-17 MED ORDER — PROPOFOL 10 MG/ML IV BOLUS
INTRAVENOUS | Status: DC | PRN
  Administered 2024-02-17: 150 mg via INTRAVENOUS
  Administered 2024-02-17: 50 mg via INTRAVENOUS

## 2024-02-17 MED ORDER — MIDAZOLAM HCL 2 MG/2ML IJ SOLN
INTRAMUSCULAR | Status: AC
  Filled 2024-02-17: qty 2

## 2024-02-17 MED ORDER — FENTANYL CITRATE (PF) 50 MCG/ML IJ SOSY
PREFILLED_SYRINGE | INTRAMUSCULAR | Status: AC
  Filled 2024-02-17: qty 2

## 2024-02-17 MED ORDER — ROCURONIUM BROMIDE 10 MG/ML (PF) SYRINGE
PREFILLED_SYRINGE | INTRAVENOUS | Status: AC
  Filled 2024-02-17: qty 10

## 2024-02-17 MED ORDER — SUGAMMADEX SODIUM 200 MG/2ML IV SOLN
INTRAVENOUS | Status: AC
  Filled 2024-02-17: qty 2

## 2024-02-17 MED ORDER — TRANEXAMIC ACID-NACL 1000-0.7 MG/100ML-% IV SOLN
INTRAVENOUS | Status: AC
  Filled 2024-02-17: qty 100

## 2024-02-17 MED ORDER — LIDOCAINE HCL (PF) 2 % IJ SOLN
INTRAMUSCULAR | Status: DC | PRN
  Administered 2024-02-17: 60 mg via INTRADERMAL

## 2024-02-17 MED ORDER — PROPOFOL 10 MG/ML IV BOLUS
INTRAVENOUS | Status: AC
  Filled 2024-02-17: qty 20

## 2024-02-17 MED ORDER — LACTATED RINGERS IV SOLN: INTRAVENOUS | Status: DC

## 2024-02-17 MED ORDER — PHENYLEPHRINE HCL-NACL 20-0.9 MG/250ML-% IV SOLN
INTRAVENOUS | Status: DC | PRN
  Administered 2024-02-17: 20 ug/min via INTRAVENOUS

## 2024-02-17 MED ORDER — ONDANSETRON HCL 4 MG/2ML IJ SOLN
INTRAMUSCULAR | Status: DC | PRN
  Administered 2024-02-17: 4 mg via INTRAVENOUS

## 2024-02-17 MED ORDER — FENTANYL CITRATE (PF) 100 MCG/2ML IJ SOLN
INTRAMUSCULAR | Status: DC | PRN
  Administered 2024-02-17 (×3): 50 ug via INTRAVENOUS

## 2024-02-17 MED ORDER — TRANEXAMIC ACID-NACL 1000-0.7 MG/100ML-% IV SOLN
1000.0000 mg | INTRAVENOUS | Status: AC
  Administered 2024-02-17: 1000 mg via INTRAVENOUS

## 2024-02-17 MED ORDER — SODIUM CHLORIDE 0.9 % IR SOLN
Status: DC | PRN
  Administered 2024-02-17: 1000 mL

## 2024-02-17 MED ORDER — ACETAMINOPHEN 10 MG/ML IV SOLN
1000.0000 mg | Freq: Once | INTRAVENOUS | Status: DC | PRN
  Administered 2024-02-17: 1000 mg via INTRAVENOUS

## 2024-02-17 MED ORDER — BUPIVACAINE HCL (PF) 0.5 % IJ SOLN
INTRAMUSCULAR | Status: DC | PRN
  Administered 2024-02-17: 10 mL via PERINEURAL

## 2024-02-17 MED ORDER — CEFAZOLIN SODIUM-DEXTROSE 2-4 GM/100ML-% IV SOLN
INTRAVENOUS | Status: AC
  Filled 2024-02-17: qty 100

## 2024-02-17 MED ORDER — CEFAZOLIN SODIUM-DEXTROSE 2-4 GM/100ML-% IV SOLN
2.0000 g | INTRAVENOUS | Status: AC
  Administered 2024-02-17: 2 g via INTRAVENOUS

## 2024-02-17 MED ORDER — ACETAMINOPHEN 10 MG/ML IV SOLN
INTRAVENOUS | Status: AC
  Filled 2024-02-17: qty 100

## 2024-02-17 MED ORDER — FENTANYL CITRATE (PF) 50 MCG/ML IJ SOSY
25.0000 ug | PREFILLED_SYRINGE | INTRAMUSCULAR | Status: DC | PRN
  Administered 2024-02-17: 50 ug via INTRAVENOUS

## 2024-02-17 SURGICAL SUPPLY — 71 items
BAG COUNTER SPONGE SURGICOUNT (BAG) IMPLANT
BAG ZIPLOCK 12X15 (MISCELLANEOUS) ×2 IMPLANT
BIT DRILL 3.2XCALB NS DISP (BIT) IMPLANT
BIT DRILL CALIBRATED 2.7 (BIT) IMPLANT
BLADE SAW SGTL 73X25 THK (BLADE) ×2 IMPLANT
BOOTIES KNEE HIGH SLOAN (MISCELLANEOUS) ×4 IMPLANT
COOLER ICEMAN CLASSIC (MISCELLANEOUS) ×2 IMPLANT
COVER BACK TABLE 60X90IN (DRAPES) ×2 IMPLANT
COVER SURGICAL LIGHT HANDLE (MISCELLANEOUS) ×2 IMPLANT
DRAPE C-ARM 42X120 X-RAY (DRAPES) ×2 IMPLANT
DRAPE INCISE IOBAN 66X45 STRL (DRAPES) ×2 IMPLANT
DRAPE POUCH INSTRU U-SHP 10X18 (DRAPES) ×2 IMPLANT
DRAPE SHEET LG 3/4 BI-LAMINATE (DRAPES) ×2 IMPLANT
DRAPE SURG 17X11 SM STRL (DRAPES) ×2 IMPLANT
DRAPE SURG ORHT 6 SPLT 77X108 (DRAPES) ×4 IMPLANT
DRAPE TOP 10253 STERILE (DRAPES) ×2 IMPLANT
DRAPE U-SHAPE 47X51 STRL (DRAPES) ×2 IMPLANT
DRSG AQUACEL AG ADV 3.5X 6 (GAUZE/BANDAGES/DRESSINGS) ×2 IMPLANT
DRSG AQUACEL AG ADV 3.5X10 (GAUZE/BANDAGES/DRESSINGS) IMPLANT
DRSG AQUACEL AG ADV 3.5X14 (GAUZE/BANDAGES/DRESSINGS) ×2 IMPLANT
DURAPREP 26ML APPLICATOR (WOUND CARE) ×4 IMPLANT
ELECT BLADE TIP CTD 4 INCH (ELECTRODE) ×2 IMPLANT
ELECT PENCIL ROCKER SW 15FT (MISCELLANEOUS) ×2 IMPLANT
ELECT REM PT RETURN 15FT ADLT (MISCELLANEOUS) ×2 IMPLANT
FACESHIELD WRAPAROUND OR TEAM (MASK) ×2 IMPLANT
GLOVE BIO SURGEON STRL SZ7.5 (GLOVE) ×2 IMPLANT
GLOVE BIOGEL PI IND STRL 6.5 (GLOVE) ×2 IMPLANT
GLOVE BIOGEL PI IND STRL 8 (GLOVE) ×2 IMPLANT
GLOVE SURG SS PI 6.5 STRL IVOR (GLOVE) ×2 IMPLANT
GOWN STRL REUS W/ TWL LRG LVL3 (GOWN DISPOSABLE) ×2 IMPLANT
GOWN STRL REUS W/ TWL XL LVL3 (GOWN DISPOSABLE) ×2 IMPLANT
HOOD PEEL AWAY T7 (MISCELLANEOUS) ×6 IMPLANT
KIT BASIN OR (CUSTOM PROCEDURE TRAY) ×2 IMPLANT
KIT TURNOVER KIT A (KITS) ×2 IMPLANT
KWIRE 2X5 SS THRDED S3 (WIRE) IMPLANT
MANIFOLD NEPTUNE II (INSTRUMENTS) ×2 IMPLANT
NEEDLE MAYO CATGUT SZ4 (NEEDLE) IMPLANT
NEEDLE TROCAR POINT SZ 2 1/2 (NEEDLE) IMPLANT
NS IRRIG 1000ML POUR BTL (IV SOLUTION) ×2 IMPLANT
PACK SHOULDER (CUSTOM PROCEDURE TRAY) ×2 IMPLANT
PAD COLD SHLDR WRAP-ON (PAD) ×2 IMPLANT
PEG LOCKING 3.2MMX44 (Peg) IMPLANT
PEG LOCKING 3.2MMX56 (Peg) IMPLANT
PEG LOCKING 3.2X38 (Screw) IMPLANT
PEG LOCKING 3.2X40 (Peg) IMPLANT
PEG LOCKING 3.2X42 (Screw) IMPLANT
PEG LOCKING 3.2X50 (Screw) IMPLANT
PLATE HUMERUS LP PROX L 3H (Plate) IMPLANT
RESTRAINT HEAD UNIVERSAL NS (MISCELLANEOUS) ×2 IMPLANT
RETRIEVER SUT HEWSON (MISCELLANEOUS) IMPLANT
SCREW LOCK CORT STAR 3.5X24 (Screw) IMPLANT
SCREW LOCK CORT STAR 3.5X26 (Screw) IMPLANT
SCREW LP NL T15 3.5X24 (Screw) IMPLANT
SET HNDPC FAN SPRY TIP SCT (DISPOSABLE) ×2 IMPLANT
SLEEVE MEASURING 3.2 (BIT) IMPLANT
SLING ARM FOAM STRAP LRG (SOFTGOODS) IMPLANT
SLING ARM FOAM STRAP MED (SOFTGOODS) IMPLANT
SLING ARM IMMOBILIZER LRG (SOFTGOODS) ×2 IMPLANT
STAPLER SKIN PROX 35W (STAPLE) IMPLANT
STRIP CLOSURE SKIN 1/2X4 (GAUZE/BANDAGES/DRESSINGS) ×2 IMPLANT
SUCTION TUBE FRAZIER 10FR DISP (SUCTIONS) IMPLANT
SUCTION TUBE FRAZIER 12FR DISP (SUCTIONS) ×2 IMPLANT
SUPPORT WRAP ARM LG (MISCELLANEOUS) ×2 IMPLANT
SUT ETHIBOND 2 V 37 (SUTURE) IMPLANT
SUT MNCRL AB 4-0 PS2 18 (SUTURE) ×2 IMPLANT
SUT VIC AB 2-0 CT1 TAPERPNT 27 (SUTURE) ×4 IMPLANT
SUTURE FIBERWR #2 38 T-5 BLUE (SUTURE) IMPLANT
SUTURE FIBERWR#2 38 REV NDL BL (SUTURE) IMPLANT
TAPE SUT LABRALTAP WHT/BLK (SUTURE) IMPLANT
TOWEL OR DSP ST BLU DLX 10/PK (DISPOSABLE) ×2 IMPLANT
TUBE SUCTION HIGH CAP CLEAR NV (SUCTIONS) ×2 IMPLANT

## 2024-02-17 NOTE — Anesthesia Procedure Notes (Signed)
 Procedure Name: Intubation Date/Time: 02/17/2024 7:33 AM  Performed by: Franchot Delon RAMAN, CRNAPre-anesthesia Checklist: Patient identified, Emergency Drugs available, Suction available and Patient being monitored Patient Re-evaluated:Patient Re-evaluated prior to induction Oxygen Delivery Method: Circle System Utilized Preoxygenation: Pre-oxygenation with 100% oxygen Induction Type: IV induction Ventilation: Mask ventilation without difficulty Laryngoscope Size: Mac and 3 Grade View: Grade I Tube type: Oral Tube size: 7.0 mm Number of attempts: 1 Airway Equipment and Method: Stylet Placement Confirmation: ETT inserted through vocal cords under direct vision, positive ETCO2 and breath sounds checked- equal and bilateral Secured at: 22 cm Tube secured with: Tape Dental Injury: Teeth and Oropharynx as per pre-operative assessment

## 2024-02-17 NOTE — Op Note (Signed)
 "  Kim Snow female 64 y.o. 02/17/2024  Preoperative diagnosis: Left shoulder displaced proximal humerus fracture  Postoperative diagnosis: Same  Procedure performed: ORIF left proximal humerus fracture  Surgeons and Role:    DEWAINE Dozier Soulier, MD - Primary   Indications:  64 y.o. female s/p fall with left displaced proximal humerus fracture. Indicated for surgery to promote anatomic restoration anatomy, improve functional outcome.     Surgeon: Josefa LELON Dozier   Assistants: Jeoffrey Northern PA-C Amber was present and scrubbed throughout the procedure and was essential in positioning, retraction, exposure, and closure)  Anesthesia: General endotracheal anesthesia with preoperative interscalene block given by the attending anesthesiologist    Procedure Detail  Findings: Anatomic reduction with Biomet locking plate  Estimated Blood Loss:  less than 50 mL         Drains: none  Blood Given: none         Specimens: none        Complications:  * No complications entered in OR log *         Disposition: PACU - hemodynamically stable.         Condition: stable    Procedure:  DESCRIPTION OF PROCEDURE: The patient was identified in preoperative  holding area where I personally marked the operative site after  verifying site, side, and procedure with the patient. The patient was taken back  to the operating room where general anesthesia was induced without  complication and was placed in the beach-chair position with the back  elevated about 40 degrees and all extremities carefully padded and  positioned. The neck was turned very slightly away from the operative field  to assist in exposure. The left upper extremity was then prepped and  draped in a standard sterile fashion. The appropriate time-out  procedure was carried out. The patient did receive IV antibiotics  within 30 minutes of incision.  A approximately 10 cm incision was made from the tip of the  coracoid to the center point in the humerus.  Dissection was carried down through subcutaneous tissues to the cephalic vein which was taken laterally with the deltoid.  The deltopectoral interval was opened exposing the underlying fracture.  Retractor was placed beneath the conjoined tendon medially.  The fracture was freed up with a small Cobb and fracture hematoma was removed.  The rotator cuff was then controlled with three #2 FiberWire sutures posterior superior and anterior.  I was then able to reduce the fracture using the sutures and the Cobb elevator.  After verification of fracture reduction with fluoroscopy the 3 hole locking S3 plate was placed laterally.  The guidepin was advanced and again fluoroscopic imaging verified positioning.  1 distal screw was placed and then the proximal locking pegs were all placed taking care not to penetrate the joint.  The 2 additional locking distal shaft screws were then placed.  The 3 previously placed sutures were then passed through the plate for additional augmentation of fixation. At this point final fluoroscopic imaging demonstrated anatomic alignment of the fracture with appropriate positioning of plate and screws.  Copious irrigation was used followed by layered closure with 2-0 Vicryl and 4-0 Monocryl.  A waterproof dressing was applied. The patient was then allowed to awaken from anesthesia transferred to the stretcher and taken to the recovery room in stable condition  Postoperative plan: She will be discharged home today with family.  She will remain in the sling with hand wrist and elbow motion only for now. "

## 2024-02-17 NOTE — Anesthesia Procedure Notes (Signed)
 Anesthesia Regional Block: Interscalene brachial plexus block   Pre-Anesthetic Checklist: , timeout performed,  Correct Patient, Correct Site, Correct Laterality,  Correct Procedure, Correct Position, site marked,  Risks and benefits discussed,  Surgical consent,  Pre-op evaluation,  At surgeon's request and post-op pain management  Laterality: Left  Prep: chloraprep       Needles:  Injection technique: Single-shot  Needle Type: Echogenic Stimulator Needle     Needle Length: 9cm  Needle Gauge: 21     Additional Needles:   Procedures:,,,, ultrasound used (permanent image in chart),,    Narrative:  Start time: 02/17/2024 7:05 AM End time: 02/17/2024 7:10 AM Injection made incrementally with aspirations every 5 mL.  Performed by: Personally  Anesthesiologist: Erma Thom SAUNDERS, MD  Additional Notes: Discussed risks and benefits of the nerve block in detail, including but not limited vascular injury, permanent nerve damage and infection.   Patient tolerated the procedure well. Local anesthetic introduced in an incremental fashion under minimal resistance after negative aspirations. No paresthesias were elicited. After completion of the procedure, no acute issues were identified and patient continued to be monitored by RN.

## 2024-02-17 NOTE — Transfer of Care (Signed)
 Immediate Anesthesia Transfer of Care Note  Patient: Kim Snow  Procedure(s) Performed: OPEN REDUCTION INTERNAL FIXATION (ORIF) PROXIMAL HUMERUS FRACTURE (Left) ARTHROPLASTY, SHOULDER, TOTAL, REVERSE (Left: Shoulder)  Patient Location: PACU  Anesthesia Type:General and Regional  Level of Consciousness: awake, alert , oriented, and patient cooperative  Airway & Oxygen Therapy: Patient Spontanous Breathing and Patient connected to face mask oxygen  Post-op Assessment: Report given to RN and Post -op Vital signs reviewed and stable  Post vital signs: Reviewed and stable  Last Vitals:  Vitals Value Taken Time  BP 116/56 02/17/24 08:54  Temp    Pulse 96 02/17/24 08:57  Resp 15 02/17/24 08:57  SpO2 98 % 02/17/24 08:57  Vitals shown include unfiled device data.  Last Pain:  Vitals:   02/17/24 0626  TempSrc: Oral  PainSc:          Complications: No notable events documented.

## 2024-02-17 NOTE — Evaluation (Signed)
 Occupational Therapy Evaluation/Discharge Patient Details Name: Kim Snow MRN: [id number] DOB: 07/07/1960 Today's Date: 02/17/2024   History of Present Illness   Patient is a 64 y/o female s/p ORIF left proximal humerus fx 02/17/24. PMH: Diverticulitis, anxiety, GERD     Clinical Impressions s/p shoulder replacement without functional use of LUE secondary to effects of surgery and interscalene block and shoulder precautions. Therapist provided education and instruction to patient and son in regards to exercises, precautions, positioning, donning upper extremity clothing and bathing while maintaining shoulder precautions, ice and edema management, use of ice machine and donning/doffing sling. Patient and son verbalized understanding and demonstrated as needed. Patient needed assistance to donn shirt, underwear, pants, socks and shoes and provided with instruction on compensatory strategies to perform ADLs. Patient to follow up with MD for further therapy needs.       If plan is discharge home, recommend the following:   Assistance with cooking/housework;Assist for transportation;Help with stairs or ramp for entrance;A little help with bathing/dressing/bathroom     Functional Status Assessment   Patient has had a recent decline in their functional status and demonstrates the ability to make significant improvements in function in a reasonable and predictable amount of time.     Equipment Recommendations   None recommended by OT      Precautions/Restrictions   Precautions Precautions: Shoulder Type of Shoulder Precautions: post op Left ORIF humerus fx Shoulder Interventions: Shoulder sling/immobilizer;At all times;Off for dressing/bathing/exercises Precaution Booklet Issued: Yes (comment) Recall of Precautions/Restrictions: Intact Precaution/Restrictions Comments: Patient education provided to pt and son regarding sling management, shoulder precautions, pain  management techniques, and ADL modifications Required Braces or Orthoses: Sling Restrictions Weight Bearing Restrictions Per Provider Order: Yes LUE Weight Bearing Per Provider Order: Non weight bearing     Mobility Bed Mobility  General bed mobility comments: up in recliner upon therapy arrival    Transfers Overall transfer level: Independent Equipment used: None       Balance Overall balance assessment: Independent      ADL either performed or assessed with clinical judgement   ADL       General ADL Comments: Due to recent shoulder surgery pt required Min A to complete LB dressing and Max for UB dressing and sling management.     Vision Baseline Vision/History: 0 No visual deficits Ability to See in Adequate Light: 0 Adequate Patient Visual Report: No change from baseline Vision Assessment?: No apparent visual deficits     Perception Perception: Not tested       Praxis Praxis: Not tested       Pertinent Vitals/Pain Pain Assessment Pain Assessment: Faces Faces Pain Scale: No hurt (Nerve block still active)     Extremity/Trunk Assessment Upper Extremity Assessment Upper Extremity Assessment: Right hand dominant;LUE deficits/detail LUE Deficits / Details: post op shoulder ORIF. Nerve block still active. Able to demonstrate hand A/ROM only. LUE: Unable to fully assess due to immobilization LUE Coordination: decreased fine motor;decreased gross motor   Lower Extremity Assessment Lower Extremity Assessment: Overall WFL for tasks assessed   Cervical / Trunk Assessment Cervical / Trunk Assessment: Normal   Communication Communication Communication: No apparent difficulties   Cognition Arousal: Alert Behavior During Therapy: WFL for tasks assessed/performed Cognition: No apparent impairments    Following commands: Intact       Cueing  General Comments   Cueing Techniques: Verbal cues  VSS on RA           Home Living Family/patient expects to  be discharged to:: Private residence Living Arrangements: Alone Available Help at Discharge: Family;Available 24 hours/day (Son will be ableto help her for a few days) Type of Home: House      Home Equipment: None          Prior Functioning/Environment Prior Level of Function : Independent/Modified Independent;History of Falls (last six months)         OT Problem List: Impaired UE functional use        OT Goals(Current goals can be found in the care plan section)   Acute Rehab OT Goals OT Goal Formulation: All assessment and education complete, DC therapy   OT Frequency:   1 time visit       AM-PAC OT 6 Clicks Daily Activity     Outcome Measure Help from another person eating meals?: None Help from another person taking care of personal grooming?: A Little Help from another person toileting, which includes using toliet, bedpan, or urinal?: A Little Help from another person bathing (including washing, rinsing, drying)?: A Little Help from another person to put on and taking off regular upper body clothing?: A Lot Help from another person to put on and taking off regular lower body clothing?: A Little 6 Click Score: 18   End of Session Equipment Utilized During Treatment: Other (comment) (sling) Nurse Communication: Other (comment) (ready for discharge)  Activity Tolerance: Patient tolerated treatment well Patient left: in chair;with call bell/phone within reach;with family/visitor present  OT Visit Diagnosis: Muscle weakness (generalized) (M62.81);Pain Pain - Right/Left: Left Pain - part of body: Shoulder                Time: 1020-1045 OT Time Calculation (min): 25 min Charges:  OT General Charges $OT Visit: 1 Visit OT Evaluation $OT Eval Low Complexity: 1 Low OT Treatments $Self Care/Home Management : 8-22 mins  Leita Howell, OTR/L,CBIS  Supplemental OT - [facility] and [facility] Secure Chat Preferred    Shaylin Blatt, Leita BIRCH 02/17/2024, 11:30 AM

## 2024-02-17 NOTE — Anesthesia Postprocedure Evaluation (Signed)
"   Anesthesia Post Note  Patient: Kim Snow  Procedure(s) Performed: OPEN REDUCTION INTERNAL FIXATION (ORIF) PROXIMAL HUMERUS FRACTURE (Left) ARTHROPLASTY, SHOULDER, TOTAL, REVERSE (Left: Shoulder)     Patient location during evaluation: PACU Anesthesia Type: Regional and General Level of consciousness: awake and alert Pain management: pain level controlled Vital Signs Assessment: post-procedure vital signs reviewed and stable Respiratory status: spontaneous breathing, nonlabored ventilation, respiratory function stable and patient connected to nasal cannula oxygen Cardiovascular status: blood pressure returned to baseline and stable Postop Assessment: no apparent nausea or vomiting Anesthetic complications: no   No notable events documented.  Last Vitals:  Vitals:   02/17/24 1000 02/17/24 1005  BP: 121/68 128/69  Pulse: 93 97  Resp: 19 15  Temp:  36.6 C  SpO2: 95% 96%    Last Pain:  Vitals:   02/17/24 1005  TempSrc:   PainSc: 0-No pain                 [name]      "

## 2024-02-17 NOTE — H&P (Signed)
 Kim Snow is an 64 y.o. female.   Chief Complaint: L shoulder injury HPI: s/p fall with L displaced proximal humerus fracture.  Past Medical History:  Diagnosis Date   Anemia    Anxiety    Asthma    Complication of anesthesia    patient could feel drain being put in once   Depression    GERD (gastroesophageal reflux disease)    Hypertension    Insomnia    Migraine     Past Surgical History:  Procedure Laterality Date   CESAREAN SECTION     CHOLECYSTECTOMY     IR RADIOLOGIST EVAL & MGMT  05/20/2017   IR RADIOLOGIST EVAL & MGMT  06/18/2017   IR RADIOLOGIST EVAL & MGMT  07/02/2017   IR RADIOLOGIST EVAL & MGMT  07/16/2017   IR RADIOLOGIST EVAL & MGMT  07/31/2017   IR RADIOLOGIST EVAL & MGMT  08/13/2017   IR RADIOLOGIST EVAL & MGMT  08/26/2017   KNEE ARTHROSCOPY     LAPAROSCOPY N/A 05/22/2017   Procedure: LAPAROSCOPY DIAGNOSTIC, LYSIS OF ADHESIONS, DRAINAGE OF ABCESS, RIGID PROCTOSCOPY, SMALL BOWEL RESECTION X2;  Surgeon: Kinsinger, Herlene Righter, MD;  Location: [facility];  Service: General;  Laterality: N/A;    History reviewed. No pertinent family history. Social History:  reports that she has never smoked. She has never used smokeless tobacco. She reports current alcohol use. She reports that she does not use drugs.  Allergies: Allergies[1]  Medications Prior to Admission  Medication Sig Dispense Refill   acetaminophen  (TYLENOL ) 325 MG tablet Take 2 tablets (650 mg total) by mouth every 6 (six) hours as needed for mild pain (or temp > 100).     ALPRAZolam  (XANAX ) 1 MG tablet Take 1 mg by mouth at bedtime as needed for sleep.      CALCIUM PO Take 1,000 mg by mouth daily.     Cyanocobalamin (VITAMIN B 12 PO) Take 1 tablet by mouth daily.     losartan -hydrochlorothiazide  (HYZAAR) 100-25 MG tablet Take 1 tablet by mouth daily. Pt states she takes a 4th of the medication     metoCLOPramide  (REGLAN ) 10 MG tablet Take 1 tablet (10 mg total) by mouth every 6 (six) hours. 20 tablet 0    omeprazole (PRILOSEC) 40 MG capsule Take 40 mg by mouth daily.     ondansetron  (ZOFRAN -ODT) 4 MG disintegrating tablet Take 1 tablet (4 mg total) by mouth every 6 (six) hours as needed for nausea or vomiting. 15 tablet 0   oxyCODONE  (ROXICODONE ) 5 MG immediate release tablet Take 1 tablet (5 mg total) by mouth every 6 (six) hours as needed. 9 tablet 0   Polysaccharide Iron  Complex (IFEREX 150 PO) Take 150 mg by mouth daily.     senna-docusate (SENOKOT-S) 8.6-50 MG tablet Take 1 tablet by mouth daily. 30 tablet 0   docusate sodium  (COLACE) 100 MG capsule Take 1 capsule (100 mg total) by mouth 2 (two) times daily. (Patient not taking: Reported on 06/10/2017) 30 capsule 0   meloxicam (MOBIC) 15 MG tablet Take 15 mg by mouth daily at 12 noon. (Patient not taking: Reported on 04/29/2023)      No results found for this or any previous visit (from the past 48 hours). No results found.  Review of Systems  All other systems reviewed and are negative.   Height 5' 4.5 (1.638 m), weight 70.3 kg. Physical Exam HENT:     Head: Atraumatic.  Cardiovascular:     Pulses: Normal pulses.  Musculoskeletal:     Comments: L shoulder swelling, ecchymosis, NVID  Neurological:     Mental Status: She is alert.      Assessment/Plan s/p fall with L displaced proximal humerus fracture. Plan ORIF vs reverse shoulder arthroplasty Risks / benefits of surgery discussed Consent on chart  NPO for OR Preop antibiotics   Josefa LELON Herring, MD 02/17/2024, 6:10 AM       [1] No Known Allergies

## 2024-02-17 NOTE — Discharge Instructions (Signed)
Discharge Instructions  ? ? ?A sling has been provided for you. You are to wear this at all times (except for bathing and dressing), until your first post operative visit with Dr. Ave Filter. Please also wear while sleeping at night. While you bath and dress, let the arm/elbow extend straight down to stretch your elbow. Wiggle your fingers and pump your first while your in the sling to prevent hand swelling. ?Use ice on the shoulder intermittently over the first 48 hours after surgery. Continue to use ice or and ice machine as needed after 48 hours for pain control/swelling.  ?Pain medicine has been prescribed for you.  ?Use your medicine liberally over the first 48 hours, and then you can begin to taper your use. You may take Extra Strength Tylenol or Tylenol only in place of the pain pills. DO NOT take ANY nonsteroidal anti-inflammatory pain medications: Advil, Motrin, Ibuprofen, Aleve, Naproxen or Naprosyn.  ?Take one aspirin 81mg  a day for 2 weeks after surgery, unless you have an aspirin sensitivity/allergy or asthma.  ?Leave your dressing on until your first follow up visit.  You may shower with the dressing.  Hold your arm as if you still have your sling on while you shower. ?Simply allow the water to wash over the site and then pat dry. Make sure your axilla (armpit) is completely dry after showering. ? ? ? ?Please call [phone] during normal business hours or [phone] after hours for any problems. Including the following: ? ?- excessive redness of the incisions ?- drainage for more than 4 days ?- fever of more than 101.5 F ? ?*Please note that pain medications will not be refilled after hours or on weekends. ? ? ?  ?

## 2024-02-19 ENCOUNTER — Encounter (HOSPITAL_COMMUNITY): Payer: Self-pay | Admitting: Orthopedic Surgery
